# Patient Record
Sex: Male | Born: 1975
Health system: Southern US, Community
[De-identification: ages and names within clinical notes are randomized; demographics above are authoritative.]

## PROBLEM LIST (undated history)

## (undated) DIAGNOSIS — Z9889 Other specified postprocedural states: Secondary | ICD-10-CM

## (undated) DIAGNOSIS — M1990 Unspecified osteoarthritis, unspecified site: Secondary | ICD-10-CM

## (undated) DIAGNOSIS — G629 Polyneuropathy, unspecified: Secondary | ICD-10-CM

## (undated) DIAGNOSIS — F419 Anxiety disorder, unspecified: Secondary | ICD-10-CM

## (undated) DIAGNOSIS — R112 Nausea with vomiting, unspecified: Secondary | ICD-10-CM

## (undated) DIAGNOSIS — I10 Essential (primary) hypertension: Secondary | ICD-10-CM

## (undated) DIAGNOSIS — F329 Major depressive disorder, single episode, unspecified: Secondary | ICD-10-CM

## (undated) DIAGNOSIS — K219 Gastro-esophageal reflux disease without esophagitis: Secondary | ICD-10-CM

## (undated) DIAGNOSIS — G8929 Other chronic pain: Secondary | ICD-10-CM

## (undated) DIAGNOSIS — F909 Attention-deficit hyperactivity disorder, unspecified type: Secondary | ICD-10-CM

## (undated) DIAGNOSIS — M549 Dorsalgia, unspecified: Secondary | ICD-10-CM

## (undated) DIAGNOSIS — E669 Obesity, unspecified: Secondary | ICD-10-CM

## (undated) DIAGNOSIS — F32A Depression, unspecified: Secondary | ICD-10-CM

## (undated) HISTORY — PX: BACK SURGERY: SHX140

## (undated) HISTORY — PX: CARPAL TUNNEL RELEASE: SHX101

## (undated) HISTORY — PX: TONSILLECTOMY: SUR1361

## (undated) HISTORY — PX: KNEE SURGERY: SHX244

## (undated) HISTORY — PX: HAND SURGERY: SHX662

---

## 2005-01-26 ENCOUNTER — Emergency Department (HOSPITAL_COMMUNITY): Admission: EM | Admit: 2005-01-26 | Discharge: 2005-01-27 | Payer: Self-pay | Admitting: *Deleted

## 2005-01-31 ENCOUNTER — Emergency Department (HOSPITAL_COMMUNITY): Admission: EM | Admit: 2005-01-31 | Discharge: 2005-01-31 | Payer: Self-pay | Admitting: Emergency Medicine

## 2005-11-07 ENCOUNTER — Emergency Department (HOSPITAL_COMMUNITY): Admission: EM | Admit: 2005-11-07 | Discharge: 2005-11-07 | Payer: Self-pay | Admitting: Emergency Medicine

## 2005-11-12 ENCOUNTER — Ambulatory Visit (HOSPITAL_COMMUNITY): Admission: RE | Admit: 2005-11-12 | Discharge: 2005-11-12 | Payer: Self-pay | Admitting: Orthopaedic Surgery

## 2006-02-21 ENCOUNTER — Emergency Department (HOSPITAL_COMMUNITY): Admission: EM | Admit: 2006-02-21 | Discharge: 2006-02-21 | Payer: Self-pay | Admitting: Emergency Medicine

## 2006-03-08 ENCOUNTER — Emergency Department (HOSPITAL_COMMUNITY): Admission: EM | Admit: 2006-03-08 | Discharge: 2006-03-08 | Payer: Self-pay | Admitting: Emergency Medicine

## 2006-03-17 ENCOUNTER — Ambulatory Visit: Payer: Self-pay | Admitting: Orthopedic Surgery

## 2006-03-21 ENCOUNTER — Ambulatory Visit (HOSPITAL_COMMUNITY): Admission: RE | Admit: 2006-03-21 | Discharge: 2006-03-21 | Payer: Self-pay | Admitting: Internal Medicine

## 2006-04-20 ENCOUNTER — Emergency Department (HOSPITAL_COMMUNITY): Admission: EM | Admit: 2006-04-20 | Discharge: 2006-04-20 | Payer: Self-pay | Admitting: Emergency Medicine

## 2006-07-02 ENCOUNTER — Emergency Department (HOSPITAL_COMMUNITY): Admission: EM | Admit: 2006-07-02 | Discharge: 2006-07-02 | Payer: Self-pay | Admitting: Emergency Medicine

## 2006-07-14 ENCOUNTER — Encounter: Admission: RE | Admit: 2006-07-14 | Discharge: 2006-07-14 | Payer: Self-pay | Admitting: Orthopedic Surgery

## 2006-07-24 ENCOUNTER — Encounter (HOSPITAL_COMMUNITY): Admission: RE | Admit: 2006-07-24 | Discharge: 2006-08-22 | Payer: Self-pay | Admitting: Family Medicine

## 2006-08-21 ENCOUNTER — Inpatient Hospital Stay (HOSPITAL_COMMUNITY): Admission: RE | Admit: 2006-08-21 | Discharge: 2006-08-23 | Payer: Self-pay | Admitting: Specialist

## 2006-10-06 ENCOUNTER — Emergency Department (HOSPITAL_COMMUNITY): Admission: EM | Admit: 2006-10-06 | Discharge: 2006-10-07 | Payer: Self-pay | Admitting: Emergency Medicine

## 2006-12-11 ENCOUNTER — Emergency Department (HOSPITAL_COMMUNITY): Admission: EM | Admit: 2006-12-11 | Discharge: 2006-12-11 | Payer: Self-pay | Admitting: Emergency Medicine

## 2007-06-03 ENCOUNTER — Emergency Department (HOSPITAL_COMMUNITY): Admission: EM | Admit: 2007-06-03 | Discharge: 2007-06-03 | Payer: Self-pay | Admitting: Emergency Medicine

## 2007-06-07 ENCOUNTER — Emergency Department (HOSPITAL_COMMUNITY): Admission: EM | Admit: 2007-06-07 | Discharge: 2007-06-07 | Payer: Self-pay | Admitting: Emergency Medicine

## 2008-02-01 ENCOUNTER — Emergency Department (HOSPITAL_COMMUNITY): Admission: EM | Admit: 2008-02-01 | Discharge: 2008-02-01 | Payer: Self-pay | Admitting: Emergency Medicine

## 2008-07-07 ENCOUNTER — Encounter (HOSPITAL_COMMUNITY)
Admission: RE | Admit: 2008-07-07 | Discharge: 2008-08-06 | Payer: Self-pay | Admitting: Adult Reconstructive Orthopaedic Surgery

## 2008-10-08 ENCOUNTER — Emergency Department (HOSPITAL_COMMUNITY): Admission: EM | Admit: 2008-10-08 | Discharge: 2008-10-08 | Payer: Self-pay | Admitting: Emergency Medicine

## 2009-06-09 ENCOUNTER — Emergency Department (HOSPITAL_COMMUNITY): Admission: EM | Admit: 2009-06-09 | Discharge: 2009-06-09 | Payer: Self-pay | Admitting: Emergency Medicine

## 2009-06-15 ENCOUNTER — Emergency Department (HOSPITAL_COMMUNITY): Admission: EM | Admit: 2009-06-15 | Discharge: 2009-06-15 | Payer: Self-pay | Admitting: Emergency Medicine

## 2009-10-26 ENCOUNTER — Emergency Department (HOSPITAL_COMMUNITY): Admission: EM | Admit: 2009-10-26 | Discharge: 2009-10-26 | Payer: Self-pay | Admitting: Emergency Medicine

## 2009-10-31 ENCOUNTER — Emergency Department (HOSPITAL_COMMUNITY): Admission: EM | Admit: 2009-10-31 | Discharge: 2009-10-31 | Payer: Self-pay | Admitting: Emergency Medicine

## 2010-01-02 ENCOUNTER — Emergency Department (HOSPITAL_COMMUNITY): Admission: EM | Admit: 2010-01-02 | Discharge: 2010-01-02 | Payer: Self-pay | Admitting: Emergency Medicine

## 2010-01-11 ENCOUNTER — Emergency Department (HOSPITAL_COMMUNITY): Admission: EM | Admit: 2010-01-11 | Discharge: 2010-01-11 | Payer: Self-pay | Admitting: Emergency Medicine

## 2010-01-26 ENCOUNTER — Emergency Department (HOSPITAL_COMMUNITY): Admission: EM | Admit: 2010-01-26 | Discharge: 2010-01-26 | Payer: Self-pay | Admitting: Emergency Medicine

## 2010-02-03 ENCOUNTER — Emergency Department (HOSPITAL_COMMUNITY): Admission: EM | Admit: 2010-02-03 | Discharge: 2010-02-03 | Payer: Self-pay | Admitting: Emergency Medicine

## 2010-02-12 ENCOUNTER — Emergency Department (HOSPITAL_COMMUNITY): Admission: EM | Admit: 2010-02-12 | Discharge: 2010-02-12 | Payer: Self-pay | Admitting: Emergency Medicine

## 2010-02-16 ENCOUNTER — Emergency Department (HOSPITAL_COMMUNITY): Admission: EM | Admit: 2010-02-16 | Discharge: 2010-02-16 | Payer: Self-pay | Admitting: Emergency Medicine

## 2010-03-01 ENCOUNTER — Emergency Department (HOSPITAL_COMMUNITY): Admission: EM | Admit: 2010-03-01 | Discharge: 2010-03-01 | Payer: Self-pay | Admitting: Emergency Medicine

## 2010-03-12 ENCOUNTER — Emergency Department (HOSPITAL_COMMUNITY): Admission: EM | Admit: 2010-03-12 | Discharge: 2010-03-12 | Payer: Self-pay | Admitting: Family Medicine

## 2010-03-20 ENCOUNTER — Ambulatory Visit (HOSPITAL_COMMUNITY): Payer: Self-pay | Admitting: Psychiatry

## 2010-03-28 ENCOUNTER — Emergency Department (HOSPITAL_COMMUNITY): Admission: EM | Admit: 2010-03-28 | Discharge: 2010-03-28 | Payer: Self-pay | Admitting: Family Medicine

## 2010-03-31 ENCOUNTER — Emergency Department (HOSPITAL_COMMUNITY): Admission: EM | Admit: 2010-03-31 | Discharge: 2010-03-31 | Payer: Self-pay | Admitting: Emergency Medicine

## 2010-04-02 ENCOUNTER — Emergency Department (HOSPITAL_COMMUNITY): Admission: EM | Admit: 2010-04-02 | Discharge: 2010-04-02 | Payer: Self-pay | Admitting: Emergency Medicine

## 2010-05-23 ENCOUNTER — Emergency Department (HOSPITAL_COMMUNITY): Admission: EM | Admit: 2010-05-23 | Discharge: 2010-05-23 | Payer: Self-pay | Admitting: Emergency Medicine

## 2010-06-12 ENCOUNTER — Emergency Department (HOSPITAL_COMMUNITY): Admission: EM | Admit: 2010-06-12 | Discharge: 2010-06-12 | Payer: Self-pay | Admitting: Emergency Medicine

## 2010-08-14 ENCOUNTER — Ambulatory Visit (HOSPITAL_COMMUNITY): Payer: Self-pay | Admitting: Psychiatry

## 2010-09-12 ENCOUNTER — Emergency Department (HOSPITAL_COMMUNITY)
Admission: EM | Admit: 2010-09-12 | Discharge: 2010-09-12 | Payer: Self-pay | Source: Home / Self Care | Admitting: Emergency Medicine

## 2010-10-03 ENCOUNTER — Ambulatory Visit (HOSPITAL_COMMUNITY): Payer: Self-pay | Admitting: Psychiatry

## 2010-11-01 ENCOUNTER — Emergency Department (HOSPITAL_COMMUNITY): Admission: EM | Admit: 2010-11-01 | Discharge: 2010-09-27 | Payer: Self-pay | Admitting: Emergency Medicine

## 2010-11-23 ENCOUNTER — Ambulatory Visit (HOSPITAL_COMMUNITY): Payer: Self-pay | Admitting: Psychiatry

## 2010-12-14 ENCOUNTER — Ambulatory Visit (HOSPITAL_COMMUNITY): Admit: 2010-12-14 | Payer: Self-pay | Admitting: Psychiatry

## 2010-12-16 ENCOUNTER — Encounter: Payer: Self-pay | Admitting: Orthopedic Surgery

## 2010-12-17 ENCOUNTER — Emergency Department (HOSPITAL_COMMUNITY)
Admission: EM | Admit: 2010-12-17 | Discharge: 2010-12-17 | Payer: Self-pay | Source: Home / Self Care | Admitting: Emergency Medicine

## 2011-01-20 ENCOUNTER — Emergency Department (HOSPITAL_COMMUNITY)
Admission: EM | Admit: 2011-01-20 | Discharge: 2011-01-21 | Disposition: A | Discharge status: Home / Self Care | Payer: Self-pay | Attending: Emergency Medicine | Admitting: Emergency Medicine

## 2011-01-20 DIAGNOSIS — M545 Low back pain, unspecified: Secondary | ICD-10-CM | POA: Insufficient documentation

## 2011-01-20 DIAGNOSIS — E669 Obesity, unspecified: Secondary | ICD-10-CM | POA: Insufficient documentation

## 2011-01-20 DIAGNOSIS — M546 Pain in thoracic spine: Secondary | ICD-10-CM | POA: Insufficient documentation

## 2011-01-20 DIAGNOSIS — F319 Bipolar disorder, unspecified: Secondary | ICD-10-CM | POA: Insufficient documentation

## 2011-01-20 DIAGNOSIS — M542 Cervicalgia: Secondary | ICD-10-CM | POA: Insufficient documentation

## 2011-01-20 DIAGNOSIS — K219 Gastro-esophageal reflux disease without esophagitis: Secondary | ICD-10-CM | POA: Insufficient documentation

## 2011-01-20 DIAGNOSIS — F17200 Nicotine dependence, unspecified, uncomplicated: Secondary | ICD-10-CM | POA: Insufficient documentation

## 2011-01-20 DIAGNOSIS — G8929 Other chronic pain: Secondary | ICD-10-CM | POA: Insufficient documentation

## 2011-01-20 DIAGNOSIS — M79609 Pain in unspecified limb: Secondary | ICD-10-CM | POA: Insufficient documentation

## 2011-01-26 ENCOUNTER — Emergency Department (HOSPITAL_COMMUNITY): Payer: Self-pay

## 2011-01-26 ENCOUNTER — Emergency Department (HOSPITAL_COMMUNITY)
Admission: EM | Admit: 2011-01-26 | Discharge: 2011-01-26 | Disposition: A | Discharge status: Home / Self Care | Payer: Self-pay | Attending: Emergency Medicine | Admitting: Emergency Medicine

## 2011-01-26 DIAGNOSIS — R109 Unspecified abdominal pain: Secondary | ICD-10-CM | POA: Insufficient documentation

## 2011-01-26 DIAGNOSIS — M542 Cervicalgia: Secondary | ICD-10-CM | POA: Insufficient documentation

## 2011-01-26 DIAGNOSIS — R1012 Left upper quadrant pain: Secondary | ICD-10-CM | POA: Insufficient documentation

## 2011-01-26 DIAGNOSIS — K219 Gastro-esophageal reflux disease without esophagitis: Secondary | ICD-10-CM | POA: Insufficient documentation

## 2011-01-26 DIAGNOSIS — M549 Dorsalgia, unspecified: Secondary | ICD-10-CM | POA: Insufficient documentation

## 2011-01-26 DIAGNOSIS — R635 Abnormal weight gain: Secondary | ICD-10-CM | POA: Insufficient documentation

## 2011-01-26 DIAGNOSIS — F319 Bipolar disorder, unspecified: Secondary | ICD-10-CM | POA: Insufficient documentation

## 2011-01-26 DIAGNOSIS — F909 Attention-deficit hyperactivity disorder, unspecified type: Secondary | ICD-10-CM | POA: Insufficient documentation

## 2011-01-26 DIAGNOSIS — G8929 Other chronic pain: Secondary | ICD-10-CM | POA: Insufficient documentation

## 2011-01-26 DIAGNOSIS — E669 Obesity, unspecified: Secondary | ICD-10-CM | POA: Insufficient documentation

## 2011-01-26 LAB — COMPREHENSIVE METABOLIC PANEL
ALT: 40 U/L (ref 0–53)
AST: 27 U/L (ref 0–37)
Alkaline Phosphatase: 76 U/L (ref 39–117)
Calcium: 8.6 mg/dL (ref 8.4–10.5)
GFR calc Af Amer: >60 mL/min (ref >60)
Potassium: 4.6 mEq/L (ref 3.5–5.1)
Sodium: 139 mEq/L (ref 135–145)
Total Protein: 6.6 g/dL (ref 6.0–8.3)

## 2011-01-26 LAB — DIFFERENTIAL
Eosinophils Absolute: 0.2 10*3/uL (ref 0.0–0.7)
Eosinophils Relative: 2 % (ref 0–5)
Lymphocytes Relative: 37 % (ref 12–46)
Lymphs Abs: 3.4 10*3/uL (ref 0.7–4.0)
Monocytes Absolute: 0.6 10*3/uL (ref 0.1–1.0)
Monocytes Relative: 6 % (ref 3–12)

## 2011-01-26 LAB — URINALYSIS, ROUTINE W REFLEX MICROSCOPIC
Bilirubin Urine: NEGATIVE
Hgb urine dipstick: NEGATIVE
Nitrite: NEGATIVE
Specific Gravity, Urine: 1.031 — ABNORMAL HIGH (ref 1.005–1.030)
Urobilinogen, UA: 1 mg/dL (ref 0.0–1.0)
pH: 6 (ref 5.0–8.0)

## 2011-01-26 LAB — CBC
HCT: 40.3 % (ref 39.0–52.0)
MCH: 30.5 pg (ref 26.0–34.0)
MCHC: 34.5 g/dL (ref 30.0–36.0)
MCV: 88.4 fL (ref 78.0–100.0)
RDW: 12.1 % (ref 11.5–15.5)

## 2011-02-06 LAB — GLUCOSE, CAPILLARY

## 2011-02-09 LAB — COMPREHENSIVE METABOLIC PANEL
Alkaline Phosphatase: 69 U/L (ref 39–117)
BUN: 11 mg/dL (ref 6–23)
Chloride: 104 mEq/L (ref 96–112)
GFR calc non Af Amer: >60 mL/min (ref >60)
Glucose, Bld: 119 mg/dL — ABNORMAL HIGH (ref 70–99)
Potassium: 4 mEq/L (ref 3.5–5.1)
Total Bilirubin: 0.6 mg/dL (ref 0.3–1.2)

## 2011-02-09 LAB — CBC
HCT: 43.3 % (ref 39.0–52.0)
Hemoglobin: 15.4 g/dL (ref 13.0–17.0)
MCH: 32.6 pg (ref 26.0–34.0)
RBC: 4.71 MIL/uL (ref 4.22–5.81)

## 2011-02-09 LAB — URINALYSIS, ROUTINE W REFLEX MICROSCOPIC
Bilirubin Urine: NEGATIVE
Glucose, UA: NEGATIVE mg/dL
Hgb urine dipstick: NEGATIVE
Nitrite: NEGATIVE
Specific Gravity, Urine: 1.018 (ref 1.005–1.030)
pH: 6 (ref 5.0–8.0)

## 2011-02-09 LAB — DIFFERENTIAL
Basophils Absolute: 0 10*3/uL (ref 0.0–0.1)
Basophils Relative: 0 % (ref 0–1)
Neutro Abs: 6.8 10*3/uL (ref 1.7–7.7)
Neutrophils Relative %: 67 % (ref 43–77)

## 2011-02-09 LAB — POCT CARDIAC MARKERS: Myoglobin, poc: 34.8 ng/mL (ref 12–200)

## 2011-02-10 LAB — GLUCOSE, CAPILLARY: Glucose-Capillary: 126 mg/dL — ABNORMAL HIGH (ref 70–99)

## 2011-02-17 LAB — CBC
HCT: 46.3 % (ref 39.0–52.0)
MCV: 93.1 fL (ref 78.0–100.0)
RBC: 4.97 MIL/uL (ref 4.22–5.81)
WBC: 14 10*3/uL — ABNORMAL HIGH (ref 4.0–10.5)

## 2011-02-17 LAB — POCT I-STAT, CHEM 8
BUN: 23 mg/dL (ref 6–23)
Creatinine, Ser: 1 mg/dL (ref 0.4–1.5)
Glucose, Bld: 82 mg/dL (ref 70–99)
Hemoglobin: 16 g/dL (ref 13.0–17.0)
Potassium: 4.2 mEq/L (ref 3.5–5.1)

## 2011-02-17 LAB — DIFFERENTIAL
Eosinophils Absolute: 0.2 10*3/uL (ref 0.0–0.7)
Lymphs Abs: 4.6 10*3/uL — ABNORMAL HIGH (ref 0.7–4.0)
Monocytes Relative: 7 % (ref 3–12)
Neutrophils Relative %: 59 % (ref 43–77)

## 2011-02-20 ENCOUNTER — Emergency Department (HOSPITAL_COMMUNITY): Payer: Self-pay

## 2011-02-20 ENCOUNTER — Emergency Department (HOSPITAL_COMMUNITY)
Admission: EM | Admit: 2011-02-20 | Discharge: 2011-02-20 | Disposition: A | Discharge status: Home / Self Care | Payer: Self-pay | Attending: Emergency Medicine | Admitting: Emergency Medicine

## 2011-02-20 DIAGNOSIS — M25569 Pain in unspecified knee: Secondary | ICD-10-CM | POA: Insufficient documentation

## 2011-02-20 DIAGNOSIS — F17200 Nicotine dependence, unspecified, uncomplicated: Secondary | ICD-10-CM | POA: Insufficient documentation

## 2011-03-12 ENCOUNTER — Emergency Department (HOSPITAL_COMMUNITY)
Admission: EM | Admit: 2011-03-12 | Discharge: 2011-03-12 | Disposition: A | Discharge status: Home / Self Care | Payer: Self-pay | Attending: Emergency Medicine | Admitting: Emergency Medicine

## 2011-03-12 DIAGNOSIS — F909 Attention-deficit hyperactivity disorder, unspecified type: Secondary | ICD-10-CM | POA: Insufficient documentation

## 2011-03-12 DIAGNOSIS — K219 Gastro-esophageal reflux disease without esophagitis: Secondary | ICD-10-CM | POA: Insufficient documentation

## 2011-03-12 DIAGNOSIS — G8929 Other chronic pain: Secondary | ICD-10-CM | POA: Insufficient documentation

## 2011-03-12 DIAGNOSIS — F17200 Nicotine dependence, unspecified, uncomplicated: Secondary | ICD-10-CM | POA: Insufficient documentation

## 2011-03-12 DIAGNOSIS — M545 Low back pain, unspecified: Secondary | ICD-10-CM | POA: Insufficient documentation

## 2011-03-12 DIAGNOSIS — F319 Bipolar disorder, unspecified: Secondary | ICD-10-CM | POA: Insufficient documentation

## 2011-04-12 NOTE — Op Note (Signed)
NAME:  [name], [name] NO.:  [id number]   MEDICAL RECORD NO.:  [id number]          PATIENT TYPE:   LOCATION:                                 FACILITY:   PHYSICIAN:  Kerrin Champagne, M.D.        DATE OF BIRTH:   DATE OF PROCEDURE:  08/21/2006  DATE OF DISCHARGE:                                 OPERATIVE REPORT   PREOPERATIVE DIAGNOSES:  Herniated nucleus pulposus, central and right-sided  L4-5; paronychial infection, left ulnar, long finger.   POSTOPERATIVE DIAGNOSES:  Herniated nucleus pulposus, central and right-  sided L4-5; paronychial infection, left ulnar, long finger.   PROCEDURES:  1. Right L4-5 microdiskectomy, left L4-5 semi-hemilaminectomy for      exploration of central disk herniation at L4-5 from the left side.  2. Incision, drainage and debridement of left long finger paronychia.   SURGEON:  Kerrin Champagne, M.D.   ASSISTANT:  Wende Neighbors, P.A.-C.   ANESTHESIA:  General via orotracheal intubation, Dr. Judie Petit.   SPECIMENS:  None.   CULTURES:  Left long finger paronychia, sent for culture and sensitivity.   ESTIMATED BLOOD LOSS:  40 cc.   COMPLICATIONS:  None.   DISPOSITION:  The patient returned to the PACU in good condition.   HISTORY OF PRESENT ILLNESS:  The patient is a 35 year old male, who has had  a long history of low back pain.  Over the last 6 months, this pain has  worsened to radiation in his right lower extremity, which is severe and  persistent.  His clinical exam shows weakness in right foot dorsiflexion and  EHL strength.  He has pain with straight leg raise on the right and dangling  position.  He is morbidly obese at 390 pounds.  He underwent preoperative  evaluation, which shows central disk herniation at L4-5 with extension to  the right side.  He has been treated conservatively by Dr. Lavada Mesi,  and is referred for evaluation and treatment.  They had prolonged discussion  regarding the size of the  individual and concerns regarding the possibility  of degenerative disk disease that was present prior to disk herniation and  is likely to be present following the disk excision.  He wishes to go ahead  with disk excision to relieve leg pain, knowing that postoperatively, he may  continue to have some degree of lower back discomfort.  The risks of  surgery, including infection, bleeding and neurologic compromise were  discussed with the patient preoperatively.  Note that the patient is on  Medicaid, and that this is apparently scheduled to stop over the next week  or so.  He does have a paronychial infection over the left long finger.  This will be left intact.  At the end of the case, he will undergo in I&D  after he has had adequate IV antibiotics of vancomycin.   DESCRIPTION OF PROCEDURES:  After adequate general anesthesia, the patient  was transferred to the operating room table.  Chest rolls were used and  pillows beneath the legs.  No Foley catheter.  The  patient is quite large  and care was taken in positioning the patient to insure that his chest rolls  were in appropriate position and alignment and the genitalia were without  compression.  The upper extremities were in position to prevent nerve  compression.  All pressure points were well padded.  The patient had  standard TED stockings and PAS hose.  Standard prep sterilely with DuraPrep  solution from the lower lumbar spine to the midsacral segment.  Draped in  the usual manner.  Iodine Vi-Drape was used.  Preoperative antibiotics with  vancomycin. The patient had 2 spinal needles placed, and an intraoperative  lateral radiograph obtained, demonstrating the needles at the L4 and L5  segments.  Incision was then made approximately 3 1/2 inches in length  through the skin and subcutaneous layers, carried down to the lumbodorsal  fascia in the midline.  Here, the lumbodorsal fascia was incised on both  sides of the spinous  processes in the interspinous ligament at the expected  L4, L5 and part of the inferior aspect of the L3 level.  The McCullough  retractors were used with the longest blades possible.  These needed to be  subset beneath the skin surface in order to allow for exposure of the deeper  structures.  A clamp was placed on the spinous process of L4.  Intraoperative lateral radiograph demonstrated a clamp at the superior  aspect of L4.  Cobb elevators were then used to elevate the muscles on both  sides off the lamina of L4 and the posterior aspect of the interlaminar  region at L4-5, as well as of the L5 lamina superiorly.  Again, the  McCullough retractor and the Boss retractors were inset even further into  the incision in order to allow for further exposure, as there no long  retractor foot blades available.  Headlight and loupe magnification were  used during this portion of the case.  A small amount of paralumbar muscles  were resected on both sides to allow for further exposure of the central  areas of the interlaminar region posteriorly at L4-5 bilaterally.  Leksell  rongeurs were then used to remove a small portion of the inferior aspect of  the lamina of L4 on both sides.  A high-speed bur was then used to further  thin the posterior elements down to the ligamentum flavum, again, over the  inferior aspect of the lamina bilaterally, over the medial facet on the  right side greater than the left, resecting about 15% of the medial portion  of the inferior articular process of L4.  On the left side,  similarly, this  was carried out.  Next, the 3-mm Kerrison was introduced and long Kerrisons  were used and long instruments were used at this point, removing a small  portion of the inferior aspect of the lamina on the right side and left side  up to the insertion of the ligamentum flavum, then elevating the ligamentum flavum with a nerve hook and resecting then from superior to inferior,   resecting the ligamentum flavum off the superior aspect of the lamina of L5  bilaterally.  The medial portion of the facet, capsular attachment and  ligamentum flavum attachment were then resected using 3-mm Kerrisons  provided on both sides.  Bleeders were controlled using bipolar  electrocautery.  Gelfoam, thrombin soaked, was then placed in the lateral  gutters.  The operating room microscope was carefully draped sterilely and  brought into the field.  First,  the right side was evaluated and a Penfield  #4 used to carefully free up the right L5 nerve root and retract it  medially, along with the lateral aspect of the thecal sac at the L4-5 level.  Epidural veins were noted to be distended and these were cauterized using  bipolar electrocautery.  Thrombin-soaked Gelfoam placed to maintain  hemostasis.  Disk was noted to be protruded, central and towards the right  side.  With the thecal sac retracted, then a 15-blade scalpel was used to  incise the disk on the right side over its inner 1/3 longitudinally.  Pituitary rongeurs were then introduced into the disk space and then used to  debride the disk space of degenerative disk material.  A very large  subligamentous fragment was then removed, which represented a large portion  of the central and right-sided disk herniation.  Following this, then,  further inspection of the disk demonstrated no further remnants of disk  material remaining free that would represent fragments that may herniate  acutely.  Irrigation was performed.  Gelfilm was placed into the lateral  recess, and then evaluation was carried out on the left side.  On the left  side, the thecal sac was carefully freed up with a Penfield #4 and a long  D'Errico retractor used to retract the lateral aspect of the thecal sac and  the L5 nerve root medially.  The disk itself was covered with very large  veins.  No attempt was made to cauterize these or disturb them, and a  standard  Penfield #4 was used to carefully probe the disk, and this  demonstrated no further herniated disk material present on the left side.  The recess had been well decompressed, and a hockey stick neural probe could  be passed out the L5 and L4 neural foramina without signs of any nerve  compression here.  Similarly, a hockey stick neural probe could be passed  out the right-sided L4 and L5 neural foramina, demonstrating patency of the  foramina and no signs of neural foraminal narrowing or lateral recess  stenosis.  Gelfilm, thrombin soaked, was placed in the lateral gutters.  Irrigation was performed.  After a few moments, the Gelfilm was removed and  there was no active bleeding present.  Soft tissues were allowed to fall  back into place.  The lumbodorsal fascia was then reapproximated in the  midline with interrupted 0 Vicryl sutures, using a UR6 needle depth.  The operating room microscope was used to perform this.  The subcu layer was  approximated with interrupted 0 Vicryl sutures, the more superficial layers  with interrupted 0 Vicryl sutures, and the even more superficial layers with  interrupted 2-0 Vicryl sutures.  Skin closed with a running subcu stitch of  4-0 Vicryl.  Dermabond was then applied and the skin edges well  approximated, demonstrating good alignment with this.  And 4 x 4's were  fixed to the skin with Hyperfix tape.  After removing drapes, the left hand  was evaluated.  The finger was felt to show an infected paronychia  laterally.  This was prepped with Betadine Prep and then lanced using a  small Metzenbaum scissors to resect the lateral aspect of the long  fingernail down to the nail matrix.  This relieved any pressure on the nail  fold laterally as a result of the patient's nail.  The are of paronychial  swelling and infection spontaneously decompressed with this maneuver.  It  represented a bullous-like  lesion with very thin skin, and this thin skin  was  resected, as it was nonviable.  The raw area over the lateral aspect of  the fingernail was then carefully cleansed.  Xeroform gauze was then placed  and 2 x 2's fixed to the finger with a 1-inch Kling.  Coban was then  applied.  Note that cultures and sensitivities were obtained and the  paronychia drained in order to determine the bacterial type and  sensitivities.  The patient was then returned to a supine position,  reactivated, extubated and returned to the recovery room in satisfactory  condition.  All instrument and sponge counts were correct.      Kerrin Champagne, M.D.  Electronically Signed     JEN/MEDQ  D:  08/21/2006  T:  08/21/2006  Job:  [id number]

## 2011-04-12 NOTE — Op Note (Signed)
NAMELUNDY, COZART NO.:  [id number]   MEDICAL RECORD NO.:  [id number]          PATIENT TYPE:  AMB   LOCATION:  SDS                          FACILITY:  [facility]   PHYSICIAN:  Vanita Panda. Magnus Ivan, M.D.DATE OF BIRTH:  18-Jun-1976   DATE OF PROCEDURE:  11/12/2005  DATE OF DISCHARGE:  11/12/2005                                 OPERATIVE REPORT   PREOPERATIVE DIAGNOSIS:  Right dorsal hand wound with extensor tendon  lacerations.   POSTOPERATIVE DIAGNOSIS:  Right dorsal hand laceration complete injuries to  extensor digitorum indices and extensor digitorum communis to the index  finger.   PROCEDURE:  1.  Right hand wound exploration.  2.  Right hand wound irrigation and debridement.  3.  Direct primary repair of right hand extensor digitorum indices.   Please delete and disregard this dictation altogether.           ______________________________  Vanita Panda. Magnus Ivan, M.D.     CYB/MEDQ  D:  11/12/2005  T:  11/14/2005  Job:  [id number]

## 2011-04-12 NOTE — Discharge Summary (Signed)
[name], [name] NO.:  [id number]   MEDICAL RECORD NO.:  [id number]          PATIENT TYPE:  INP   LOCATION:  5020                         FACILITY:  [facility]   PHYSICIAN:  Kerrin Champagne, M.D.   DATE OF BIRTH:  [birth year]   DATE OF ADMISSION:  08/21/2006  DATE OF DISCHARGE:  08/23/2006                                 DISCHARGE SUMMARY   ADMISSION DIAGNOSIS:  1. Herniated nucleus pulposus, central right side at L4-5.  2. Paronychium infection, left ulnar long finger.   DISCHARGE DIAGNOSES:  1. Herniated nucleus pulposus, central right side at L4-5.  2. Paronychium infection, left ulnar long finger.   PROCEDURES:  On August 21, 2006, the patient underwent right L4-5  microdiskectomy, left L4-5 semi hemilaminectomy for exploration of central  disk herniation at the L4-5 level from the left side.  1. Incision, drainage and debridement of left long finger paronychia.      This was performed by Dr. Otelia Ramirez, assisted by Maud Deed, [facility], under      general anesthesia.   CONSULTATIONS:  None.   BRIEF HISTORY:  The patient is a 35 year old male with a 35-month history of  increasing pain in the low back with radiation to his right lower extremity  which is severe and persistent.  Clinical exam showed weakness in the right  foot dorsiflexion and EHL strength.  He had pain with straight leg raise as  well as in the dangling position on the right.  The patient was noted to be  morbidly obese at 390 pounds.  MRI scan did show central disk herniation at  L4-5 with extension to the right side.  Conservative treatment was no longer  giving him relief of his symptoms, and it was felt he would require surgical  intervention and was admitted for the procedure as stated above.   BRIEF HOSPITAL COURSE:  Upon in the admission to the hospital for his back  surgery, he was noted to have parenchymal infection of the left long finger.  The surgical case was not cancelled;  however, the incision,  drainage and  debridement was added to the left long finger.  Culture and sensitivity were  sent from the purulent matter taken from the left long finger paronychia.  He was given IV vancomycin intraoperatively and postoperatively.  The  patient tolerated the procedure under general anesthesia without  complications.   Postoperatively, the patient was very slow with activity.  Physical therapy  and occupational therapy were obtained.  The patient was assisted with out  of bed and ambulation in the hallway.  A walker was given to him for  ambulation.  He had postoperative nausea which was limiting him on the first  postoperative day with his activity, and, therefore, he remained in the  hospital for an additional day.  On the second postoperative day, he did  meet all goals and demonstrated good safety and judgment and was able to be  discharged to his home.   Recommendations for home health physical therapy were made and arranged  prior to discharge.  His incision was healing without  signs of infection.  He was able to take oral analgesics without difficulty.  The dressing of his  paronychia incision and drainage was kept intact.  He was started on  doxycycline 100 mg one p.o. q.12 h.   Culture and sensitivity of the paronychia on the left long finger showed  multiple organisms. No Staphylococcus aureus isolated.  No group A strep  isolated.  Other pertinent labs include CBC on admission which was within  normal limits.   PLAN:  The patient was discharged to his home.  He was instructed to  ambulate as tolerated.  He will keep his incision dry and clean for 4 days  and then will be allowed to shower if there is no wound drainage.  The left  long finger wound will be soaked in soapy water two times a day with  antibiotic ointment applied and dressing applied to the finger.  He will  have no driving for 2 weeks.  No lifting for 4-6 weeks greater than 10  pounds.   He is encouraged to increase his activity slowly.  He will have a  regular diet at home.   Prescriptions given include:  1. Percocet 5/325 one to two every 4-6 hours as needed for pain.  2. Doxycycline 100 mg one p.o. q.12 h.  3. Robaxin 1 every 8 hours as needed for spasm.  4. Restoril 15 mg 1 at bedtime as needed for sleep.   He will follow up with Dr. Otelia Ramirez in 2 weeks.   CONDITION ON DISCHARGE:  Stable.      Wende Neighbors, P.A.      Kerrin Champagne, M.D.  Electronically Signed    SMV/MEDQ  D:  09/22/2006  T:  09/22/2006  Job:  [id number]

## 2011-04-12 NOTE — Op Note (Signed)
[name], [name] NO.:  [id number]   MEDICAL RECORD NO.:  [id number]          PATIENT TYPE:  AMB   LOCATION:  SDS                          FACILITY:  [facility]   PHYSICIAN:  Vanita Panda. Magnus Ivan, M.D.DATE OF BIRTH:  October 29, 1976   DATE OF PROCEDURE:  11/12/2005  DATE OF DISCHARGE:  11/12/2005                                 OPERATIVE REPORT   PREOPERATIVE DIAGNOSIS:  Right dorsal hand laceration with extensor tendon  injuries.   POSTOPERATIVE DIAGNOSIS:  Right hand laceration with complete injury to  extensor indicis proprius (EIP)  tendon and extensor digitorum communis  (EDC) to the index finger.   PROCEDURES:  1.  Right hand wound exploration.  2.  Irrigation and debridement of right dorsal hand wound.  3.  Direct primary repair of right extensor indicis proprius tendon and      extensor digitorum communis tendon to the index finger.   SURGEON:  Vanita Panda. Magnus Ivan, M.D.   ANESTHESIA:  General.   BLOOD LOSS:  Minimal.   COMPLICATIONS:  None.   ANTIBIOTICS:  1 g IV Ancef.   INDICATIONS:  Briefly, [name] is a 29-year right-hand-dominant male  who four days ago sustained injury to his dominant right hand. He  accidentally punched a table that had glass on it and sustained a large  laceration to the dorsum of his hand.  This was cleaned by the ER staff and  closed, it was recognized that he had an inability to extend his index  finger.  He was given follow-up in my clinic and on examination I did find  this injury, so I set him up for exploration of the hand wound as well  hopefully a primary repair of this recognized tendon injury.  The risks and  benefits this were explained to him and are well-understood.  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   PROCEDURE DESCRIPTION:  After informed consent was obtained and the  appropriate right hand was marked, [name] was brought to the  operating  room and placed supine on the operating table.  General anesthesia was then  obtained and his right arm was placed on an arm table.  A nonsterile  tourniquet was placed around his upper arm but was never utilized during the  case.  His arm was then prepped and draped with Betadine scrub and paint.  Attention was turned to the dorsal hand wound.  The sutures were removed  from a large incision on the dorsum of his hand.  There was a large hematoma  that was noted.  A thorough irrigation and debridement was carried out and  hematoma was evacuated and cleaned thoroughly with 250 mL of bacitracin  solution.  This was followed by 1 L of normal saline solution.  It was  recognized that he had a complete rupture of his EIP tendon and his EDC  tendon to the index finger, but the proximal and distal stumps of the  laceration were easily visualized.  The extensor digitorum communis (EDC  tendon) to  the middle, ring and little fingers were all intact.  Next a  repair was carried out of the EIP and EDC tendons to the index finger.  This  was utilized using 3-0 Ethibond suture using a modified Kessler technique.  The tendon ends were brought together.  This was done first on the EIP  tendon and next on the [facility] to the index.  A 5-0 Ethibond suture was then  oversewn to the peritenon of each tendon to secure the repair.  The wound  was then irrigated again and was closed with the soft tissue being closed at  the level the skin with interrupted 3-0 Prolene suture.  The skin was  infiltrated with 0.25% plain Sensorcaine.  Again, the tourniquet was never  used during the case.  Xeroform was placed over the incision, followed by a  well-padded short-arm splint with the wrist extended and the fingers placed  in a position of function to allow for tension to be off of the tendon  repair.  Once the splint was hardened, the patient was awakened, extubated,  and taken to the recovery room in stable  condition.  Again, there were no  complications.           ______________________________  Vanita Panda. Magnus Ivan, M.D.     CYB/MEDQ  D:  11/12/2005  T:  11/14/2005  Job:  [id number]

## 2011-04-18 ENCOUNTER — Other Ambulatory Visit (HOSPITAL_COMMUNITY): Payer: Self-pay | Admitting: Internal Medicine

## 2011-04-18 DIAGNOSIS — IMO0002 Reserved for concepts with insufficient information to code with codable children: Secondary | ICD-10-CM

## 2011-04-19 ENCOUNTER — Other Ambulatory Visit: Payer: Self-pay | Admitting: Internal Medicine

## 2011-04-19 DIAGNOSIS — M545 Low back pain: Secondary | ICD-10-CM

## 2011-04-23 ENCOUNTER — Ambulatory Visit
Admission: RE | Admit: 2011-04-23 | Discharge: 2011-04-23 | Disposition: A | Discharge status: Home / Self Care | Payer: No Typology Code available for payment source | Source: Ambulatory Visit | Attending: Internal Medicine | Admitting: Internal Medicine

## 2011-04-23 DIAGNOSIS — M545 Low back pain: Secondary | ICD-10-CM

## 2011-04-26 ENCOUNTER — Emergency Department (HOSPITAL_COMMUNITY)
Admission: EM | Admit: 2011-04-26 | Discharge: 2011-04-26 | Disposition: A | Discharge status: Home / Self Care | Payer: Self-pay | Attending: Emergency Medicine | Admitting: Emergency Medicine

## 2011-04-26 DIAGNOSIS — M5380 Other specified dorsopathies, site unspecified: Secondary | ICD-10-CM | POA: Insufficient documentation

## 2011-04-26 DIAGNOSIS — M545 Low back pain, unspecified: Secondary | ICD-10-CM | POA: Insufficient documentation

## 2011-04-26 DIAGNOSIS — F319 Bipolar disorder, unspecified: Secondary | ICD-10-CM | POA: Insufficient documentation

## 2011-04-26 DIAGNOSIS — K219 Gastro-esophageal reflux disease without esophagitis: Secondary | ICD-10-CM | POA: Insufficient documentation

## 2011-04-26 DIAGNOSIS — F909 Attention-deficit hyperactivity disorder, unspecified type: Secondary | ICD-10-CM | POA: Insufficient documentation

## 2011-04-26 DIAGNOSIS — Z79899 Other long term (current) drug therapy: Secondary | ICD-10-CM | POA: Insufficient documentation

## 2011-04-26 DIAGNOSIS — F17200 Nicotine dependence, unspecified, uncomplicated: Secondary | ICD-10-CM | POA: Insufficient documentation

## 2011-05-07 ENCOUNTER — Ambulatory Visit: Payer: No Typology Code available for payment source

## 2011-05-17 ENCOUNTER — Ambulatory Visit: Payer: Self-pay | Attending: Internal Medicine | Admitting: Physical Therapy

## 2011-05-22 ENCOUNTER — Ambulatory Visit (HOSPITAL_BASED_OUTPATIENT_CLINIC_OR_DEPARTMENT_OTHER): Payer: No Typology Code available for payment source

## 2011-05-25 ENCOUNTER — Emergency Department (HOSPITAL_COMMUNITY)
Admission: EM | Admit: 2011-05-25 | Discharge: 2011-05-25 | Disposition: A | Discharge status: Home / Self Care | Payer: Self-pay | Attending: Emergency Medicine | Admitting: Emergency Medicine

## 2011-05-25 DIAGNOSIS — F17200 Nicotine dependence, unspecified, uncomplicated: Secondary | ICD-10-CM | POA: Insufficient documentation

## 2011-05-25 DIAGNOSIS — B029 Zoster without complications: Secondary | ICD-10-CM | POA: Insufficient documentation

## 2011-06-05 ENCOUNTER — Encounter: Payer: Self-pay | Admitting: *Deleted

## 2011-06-05 ENCOUNTER — Emergency Department (HOSPITAL_COMMUNITY)
Admission: EM | Admit: 2011-06-05 | Discharge: 2011-06-05 | Disposition: A | Discharge status: Home / Self Care | Payer: Self-pay | Attending: Emergency Medicine | Admitting: Emergency Medicine

## 2011-06-05 ENCOUNTER — Emergency Department (HOSPITAL_COMMUNITY): Payer: Self-pay

## 2011-06-05 DIAGNOSIS — R1012 Left upper quadrant pain: Secondary | ICD-10-CM | POA: Insufficient documentation

## 2011-06-05 HISTORY — DX: Dorsalgia, unspecified: M54.9

## 2011-06-05 HISTORY — DX: Gastro-esophageal reflux disease without esophagitis: K21.9

## 2011-06-05 HISTORY — DX: Polyneuropathy, unspecified: G62.9

## 2011-06-05 HISTORY — DX: Other chronic pain: G89.29

## 2011-06-05 LAB — COMPREHENSIVE METABOLIC PANEL
BUN: 13 mg/dL (ref 6–23)
CO2: 28 mEq/L (ref 19–32)
Calcium: 9.6 mg/dL (ref 8.4–10.5)
GFR calc Af Amer: >60 mL/min (ref >60)
GFR calc non Af Amer: >60 mL/min (ref >60)
Glucose, Bld: 112 mg/dL — ABNORMAL HIGH (ref 70–99)
Total Protein: 7.9 g/dL (ref 6.0–8.3)

## 2011-06-05 LAB — DIFFERENTIAL
Eosinophils Absolute: 0.2 10*3/uL (ref 0.0–0.7)
Eosinophils Relative: 2 % (ref 0–5)
Lymphs Abs: 2.6 10*3/uL (ref 0.7–4.0)
Monocytes Absolute: 0.6 10*3/uL (ref 0.1–1.0)
Monocytes Relative: 7 % (ref 3–12)

## 2011-06-05 LAB — URINALYSIS, ROUTINE W REFLEX MICROSCOPIC
Bilirubin Urine: NEGATIVE
Nitrite: NEGATIVE
Protein, ur: NEGATIVE mg/dL
Specific Gravity, Urine: 1.025 (ref 1.005–1.030)
Urobilinogen, UA: 0.2 mg/dL (ref 0.0–1.0)

## 2011-06-05 LAB — CBC
HCT: 44.5 % (ref 39.0–52.0)
Hemoglobin: 15.9 g/dL (ref 13.0–17.0)
MCH: 31.8 pg (ref 26.0–34.0)
MCV: 89 fL (ref 78.0–100.0)
RBC: 5 MIL/uL (ref 4.22–5.81)

## 2011-06-05 LAB — LIPASE, BLOOD: Lipase: 28 U/L (ref 11–59)

## 2011-06-05 MED ORDER — IOHEXOL 300 MG/ML  SOLN
100.0000 mL | Freq: Once | INTRAMUSCULAR | Status: AC | PRN
  Administered 2011-06-05: 100 mL via INTRAVENOUS

## 2011-06-05 MED ORDER — HYDROMORPHONE HCL 1 MG/ML IJ SOLN
1.0000 mg | Freq: Once | INTRAMUSCULAR | Status: AC
  Administered 2011-06-05: 1 mg via INTRAVENOUS
  Filled 2011-06-05: qty 1

## 2011-06-05 MED ORDER — ONDANSETRON HCL 4 MG/2ML IJ SOLN
4.0000 mg | Freq: Once | INTRAMUSCULAR | Status: AC
  Administered 2011-06-05: 4 mg via INTRAVENOUS
  Filled 2011-06-05: qty 2

## 2011-06-05 MED ORDER — HYDROCODONE-ACETAMINOPHEN 5-325 MG PO TABS: 1.0000 | ORAL_TABLET | ORAL | Status: AC | PRN

## 2011-06-05 MED ORDER — SODIUM CHLORIDE 0.9 % IV SOLN
Freq: Once | INTRAVENOUS | Status: AC
  Administered 2011-06-05: 12:00:00 via INTRAVENOUS

## 2011-06-05 MED ORDER — ONDANSETRON HCL 4 MG PO TABS: 4.0000 mg | ORAL_TABLET | Freq: Four times a day (QID) | ORAL | Status: AC

## 2011-06-05 NOTE — ED Notes (Signed)
Iv initally started in right ac. Blew past iv meds admistered

## 2011-06-05 NOTE — ED Notes (Signed)
Patient states that he is still having pain in his side. Patient was wanting something to eat and drink patient was told that the RN and MD would be notified.

## 2011-06-05 NOTE — ED Provider Notes (Addendum)
History     Chief Complaint  Patient presents with  . GI Problem   Patient is a 35 y.o. male presenting with abdominal pain. The history is provided by the patient. No language interpreter was used.  Abdominal Pain The primary symptoms of the illness include abdominal pain, nausea and vomiting. The primary symptoms of the illness do not include shortness of breath, diarrhea, hematemesis or hematochezia. Episode onset: Recurrence began 3 days ago, intial onset 2 months ago. The onset of the illness was sudden. The problem has not changed since onset. The abdominal pain is located in the LUQ. Pain radiation: left back. The abdominal pain is relieved by nothing. Exacerbated by: nothing.  Associated with: nothing. The patient has not had a change in bowel habit. Significant associated medical issues include GERD.   C/o recurrent LUQ abdominal pain radiating to left back with associated nausea. Patient notes same abdominal pain initially began 2 months ago, resolved, and then returned 3 days ago and has been persistent since. Patient states he is suppose to be on Nexium but has not had his medication for the last 1.5 weeks. Denies blood or mucus in stool, diarrhea, fever.H/o chronic back pain, GERD. Past Medical History  Diagnosis Date  . Neuropathy   . GERD (gastroesophageal reflux disease)   . Chronic back pain     Past Surgical History  Procedure Date  . Back surgery   . Hand surgery     Family History  Problem Relation Age of Onset  . Heart failure Mother   . Diabetes Father     History  Substance Use Topics  . Smoking status: Current Everyday Smoker -- 1.0 packs/day for 18 years    Types: Cigarettes  . Smokeless tobacco: Not on file  . Alcohol Use: No      Review of Systems  Respiratory: Negative for shortness of breath.   Gastrointestinal: Positive for nausea, vomiting and abdominal pain. Negative for diarrhea, blood in stool, hematochezia and hematemesis.  All other  systems reviewed and are negative.    Physical Exam  BP 136/82  Pulse 75  Temp(Src) 98.2 F (36.8 C) (Oral)  Resp 18  Ht 5\' 11"  (1.803 m)  Wt 398 lb (180.532 kg)  BMI 55.51 kg/m2  SpO2 97%  Physical Exam  Nursing note and vitals reviewed. Constitutional: He is oriented to person, place, and time. Vital signs are normal. He appears well-developed and well-nourished.  HENT:  Head: Normocephalic and atraumatic.  Eyes: Conjunctivae are normal.       Normal appearance  Neck: Normal range of motion. Neck supple.  Cardiovascular: Normal rate, regular rhythm, normal heart sounds, intact distal pulses and normal pulses.   No murmur heard. Pulmonary/Chest: Effort normal and breath sounds normal. He has no wheezes.  Abdominal: Soft. Bowel sounds are normal. He exhibits no distension. There is tenderness in the left upper quadrant.  Musculoskeletal: Normal range of motion. He exhibits no edema.  Neurological: He is alert and oriented to person, place, and time. No sensory deficit.  Skin: Skin is warm and dry.  Psychiatric: He has a normal mood and affect. His behavior is normal.    ED Course  Procedures Recheck-1:59 PM, patient informed of imaging and lab results- all normal. Intent to d/c home. MDM CT-Abdomen. Pain control. CMP. UA. CBC with diff.    No scribe involved of care of pt    Donnetta Hutching, MD 06/05/11 1419  Donnetta Hutching, MD 07/01/11 1538  Donnetta Hutching, MD  07/12/11 1329 

## 2011-06-05 NOTE — ED Notes (Signed)
Pt c/o LUQ abdominal pain x 3 days. Also c/o nausea, vomiting and diarrhea since last night.

## 2011-06-05 NOTE — ED Notes (Signed)
Pt puiled iv states the doctor told him he was going. Pt made aware that discharge paper work was not ready

## 2011-06-05 NOTE — ED Notes (Signed)
Pt complain of pain in left side of belly. States he was up all night vomiting up green bile

## 2011-06-11 ENCOUNTER — Ambulatory Visit (HOSPITAL_BASED_OUTPATIENT_CLINIC_OR_DEPARTMENT_OTHER): Payer: Self-pay | Attending: Internal Medicine

## 2011-08-19 LAB — CBC
MCV: 89.3
Platelets: 169
RBC: 5
WBC: 4.4

## 2011-08-19 LAB — POCT CARDIAC MARKERS
CKMB, poc: <1 — ABNORMAL LOW
CKMB, poc: <1 — ABNORMAL LOW
Myoglobin, poc: 45.9
Operator id: 228551
Troponin i, poc: <0.05
Troponin i, poc: <0.05

## 2011-08-19 LAB — POCT I-STAT, CHEM 8
Chloride: 101
Glucose, Bld: 80
HCT: 47
Hemoglobin: 16
Potassium: 4.6

## 2011-08-27 ENCOUNTER — Emergency Department (HOSPITAL_COMMUNITY): Admission: EM | Admit: 2011-08-27 | Discharge: 2011-08-27 | Discharge status: Against Medical Advice | Payer: Self-pay

## 2011-09-02 ENCOUNTER — Emergency Department (HOSPITAL_COMMUNITY)
Admission: EM | Admit: 2011-09-02 | Discharge: 2011-09-02 | Disposition: A | Discharge status: Home / Self Care | Payer: Self-pay | Attending: Emergency Medicine | Admitting: Emergency Medicine

## 2011-09-02 DIAGNOSIS — F319 Bipolar disorder, unspecified: Secondary | ICD-10-CM | POA: Insufficient documentation

## 2011-09-02 DIAGNOSIS — F909 Attention-deficit hyperactivity disorder, unspecified type: Secondary | ICD-10-CM | POA: Insufficient documentation

## 2011-09-02 DIAGNOSIS — M25579 Pain in unspecified ankle and joints of unspecified foot: Secondary | ICD-10-CM | POA: Insufficient documentation

## 2011-09-02 DIAGNOSIS — M542 Cervicalgia: Secondary | ICD-10-CM | POA: Insufficient documentation

## 2011-09-02 DIAGNOSIS — G8929 Other chronic pain: Secondary | ICD-10-CM | POA: Insufficient documentation

## 2011-09-02 DIAGNOSIS — E669 Obesity, unspecified: Secondary | ICD-10-CM | POA: Insufficient documentation

## 2011-09-02 DIAGNOSIS — K219 Gastro-esophageal reflux disease without esophagitis: Secondary | ICD-10-CM | POA: Insufficient documentation

## 2011-09-02 DIAGNOSIS — M549 Dorsalgia, unspecified: Secondary | ICD-10-CM | POA: Insufficient documentation

## 2011-09-10 LAB — URINALYSIS, ROUTINE W REFLEX MICROSCOPIC
Ketones, ur: 15 — AB
Nitrite: NEGATIVE
Protein, ur: NEGATIVE
Urobilinogen, UA: 1
pH: 6

## 2011-09-10 LAB — I-STAT 8, (EC8 V) (CONVERTED LAB)
Acid-Base Excess: 3 — ABNORMAL HIGH
Bicarbonate: 25.3 — ABNORMAL HIGH
Glucose, Bld: 104 — ABNORMAL HIGH
TCO2: 26
pCO2, Ven: 31.1 — ABNORMAL LOW
pH, Ven: 7.519 — ABNORMAL HIGH

## 2011-09-10 LAB — CBC
HCT: 47.7
Hemoglobin: 16.7
MCHC: 34.9
Platelets: 220
RDW: 11.8

## 2011-09-10 LAB — DIFFERENTIAL
Basophils Absolute: 0
Basophils Relative: 0
Eosinophils Relative: 9 — ABNORMAL HIGH
Lymphocytes Relative: 24
Monocytes Absolute: 1.1 — ABNORMAL HIGH
Neutro Abs: 6.1

## 2012-01-17 ENCOUNTER — Encounter (HOSPITAL_COMMUNITY): Payer: Self-pay | Admitting: *Deleted

## 2012-01-17 ENCOUNTER — Emergency Department (HOSPITAL_COMMUNITY)
Admission: EM | Admit: 2012-01-17 | Discharge: 2012-01-17 | Disposition: A | Discharge status: Home Health | Payer: Self-pay | Attending: Emergency Medicine | Admitting: Emergency Medicine

## 2012-01-17 DIAGNOSIS — R209 Unspecified disturbances of skin sensation: Secondary | ICD-10-CM | POA: Insufficient documentation

## 2012-01-17 DIAGNOSIS — K219 Gastro-esophageal reflux disease without esophagitis: Secondary | ICD-10-CM | POA: Insufficient documentation

## 2012-01-17 DIAGNOSIS — M542 Cervicalgia: Secondary | ICD-10-CM | POA: Insufficient documentation

## 2012-01-17 DIAGNOSIS — F17200 Nicotine dependence, unspecified, uncomplicated: Secondary | ICD-10-CM | POA: Insufficient documentation

## 2012-01-17 DIAGNOSIS — G8929 Other chronic pain: Secondary | ICD-10-CM

## 2012-01-17 NOTE — ED Notes (Signed)
Pt left before being D/C.

## 2012-01-17 NOTE — ED Notes (Signed)
States he has had numbness in his left arm for over 2 months, states he has been diagnosed with carpal tunnel syndrome and also has problems with his neck, refused to have an ekg and states he is sure it is not his heart, c/o pain in left hand

## 2012-01-17 NOTE — Discharge Instructions (Signed)
Chronic Pain Chronic pain can be defined as pain that is lasting, off and on, and lasts for 3 to 6 months or longer. Many things cause chronic pain, which can make it difficult to make a discrete diagnosis. There are many treatment options available for chronic pain. However, finding a treatment that works well for you may require trying various approaches until a suitable one is found. CAUSES  In some types of chronic medical conditions, the pain is caused by a normal pain response within the body. A normal pain response helps the body identify illness or injury and prevent further damage from being done. In these cases, the cause of the pain may be identified and treated, even if it may not be cured completely. Examples of chronic conditions which can cause chronic pain include:  Inflammation of the joints (arthritis).   Back pain or neck pain (including bulging or herniated disks).   Migraine headaches.   Cancer.  In some other types of chronic pain syndromes, the pain is caused by an abnormal pain response within the body. An abnormal pain response is present when there is no ongoing cause (or stimulus) for the pain, or when the cause of the pain is arising from the nerves or nervous system itself. Examples of conditions which can cause chronic pain due to an abnormal pain response include:  Fibromyalgia.   Reflex sympathetic dystrophy (RSD).   Neuropathy (when the nerves themselves are damaged, and may cause pain).  DIAGNOSIS  Your caregiver will help diagnose your condition over time. In many cases, the initial focus will be on excluding conditions that could be causing the pain. Depending on your symptoms, your caregiver may order some tests to diagnose your condition. Some of these tests include:  Blood tests.   Computerized X-ray scans (CT scan).   Computerized magnetic scans (MRI).   X-rays.   Ultrasounds.   Nerve conduction studies.   Consultation with other physicians or  specialists.  TREATMENT  There are many treatment options for people suffering from chronic pain. Finding a treatment that works well may take time.   You may be referred to a pain management specialist.   You may be put on medication to help with the pain. Unfortunately, some medications (such as opiate medications) may not be very effective in cases where chronic pain is due to abnormal pain responses. Finding the right medications can take some time.   Adjunctive therapies may be used to provide additional relief and improve a patient's quality of life. These therapies include:   Mindfulness meditation.   Acupuncture.   Biofeedback.   Cognitive-behavioral therapy.   In certain cases, surgical interventions may be attempted.  HOME CARE INSTRUCTIONS   Make sure you understand these instructions prior to discharge.   Ask any questions and share any further concerns you have with your caregiver prior to discharge.   Take all medications as directed by your caregiver.   Keep all follow-up appointments.  SEEK MEDICAL CARE IF:   Your pain gets worse.   You develop a new pain that was not present before.   You cannot tolerate any medications prescribed by your caregiver.   You develop new symptoms since your last visit with your caregiver.  SEEK IMMEDIATE MEDICAL CARE IF:   You develop muscular weakness.   You have decreased sensation or numbness.   You lose control of bowel or bladder function.   Your pain suddenly gets much worse.   You have an oral   temperature above 102 F (38.9 C), not controlled by medication.   You develop shaking chills, confusion, chest pain, or shortness of breath.  Document Released: 08/03/2002 Document Revised: 07/24/2011 Document Reviewed: 11/09/2008 [facility] Patient Information 2012 [place], [place].   The numbness in your left hand could be carpal tunnel but more likely is coming from the underlying neck problem.  Take ibuprofen 800 mg  every 8 hrs with food.  Follow up with getting the MRI of the cervical spine.  That may provide you with a lot of helpful information.

## 2012-01-17 NOTE — ED Provider Notes (Signed)
Medical screening examination/treatment/procedure(s) were performed by non-physician practitioner and as supervising physician I was immediately available for consultation/collaboration.   Zayvier Caravello, MD 01/17/12 2256 

## 2012-01-17 NOTE — ED Provider Notes (Signed)
History     CSN: [id number]  Arrival date & time 01/17/12  Barry Brunner   First MD Initiated Contact with Patient 01/17/12 2024      Chief Complaint  Patient presents with  . Neck Pain    (Consider location/radiation/quality/duration/timing/severity/associated sxs/prior treatment) HPI Comments: Pt has chronic neck pain and sees a neurologist AT [facility].  THEY RECENTLY ATTEMPTED AN mri OF HIS NECK THERE "BUT I FREAKED OUT".  He is attempting to get disability and is waiting for his attorney to re-schedule the MRI under sedation.  He was told years ago that he has carpal tunnel in his L hand but has not had any formal evaluation.  He came here tonight b/c he is concerned that he might have a blood clot.  He denies leg pain, CP, SOB, hemoptysis.    Patient is a 36 y.o. male presenting with neck pain. The history is provided by the patient.  Neck Pain  This is a chronic problem. Episode onset: L neck pain for several years.  L hand numbness for at least 2 years but worse over past 6 weeks. The problem occurs constantly. The problem has been gradually worsening. The pain is associated with nothing. The pain is moderate. Associated symptoms include numbness. He has tried nothing for the symptoms.    Past Medical History  Diagnosis Date  . Neuropathy   . GERD (gastroesophageal reflux disease)   . Chronic back pain     Past Surgical History  Procedure Date  . Back surgery   . Hand surgery     Family History  Problem Relation Age of Onset  . Heart failure Mother   . Diabetes Father     History  Substance Use Topics  . Smoking status: Current Everyday Smoker -- 1.0 packs/day for 18 years    Types: Cigarettes  . Smokeless tobacco: Not on file  . Alcohol Use: No      Review of Systems  HENT: Positive for neck pain.   Neurological: Positive for numbness.  All other systems reviewed and are negative.    Allergies  Review of patient's allergies indicates no known allergies.  Home  Medications  No current outpatient prescriptions on file.  BP 155/93  Pulse 91  Temp(Src) 97.3 F (36.3 C) (Oral)  Resp 18  Ht 5\' 11"  (1.803 m)  Wt 390 lb (176.903 kg)  BMI 54.39 kg/m2  SpO2 99%  Physical Exam  Nursing note and vitals reviewed. Constitutional: He is oriented to person, place, and time. He appears well-developed and well-nourished.  HENT:  Head: Normocephalic and atraumatic.  Eyes: EOM are normal.  Neck: Trachea normal and normal range of motion. Normal carotid pulses and no JVD present. Carotid bruit is not present.    Cardiovascular: Normal rate, regular rhythm, normal heart sounds and intact distal pulses.   Pulmonary/Chest: Effort normal and breath sounds normal. No respiratory distress.  Abdominal: Soft. He exhibits no distension. There is no tenderness.  Musculoskeletal: Normal range of motion.       Cervical back: He exhibits pain. He exhibits normal range of motion, no tenderness, no bony tenderness, no swelling, no deformity, no spasm and normal pulse.       Left hand: He exhibits normal range of motion, no tenderness, no bony tenderness, normal capillary refill, no deformity and no swelling. decreased sensation noted. Decreased sensation is present in the ulnar distribution, is present in the medial distribution and is present in the radial distribution. Normal strength noted.  Tinel's sign and Phalen's sign negative.  Good motor function.  Generalized "numbness" in L hand.  Neurological: He is alert and oriented to person, place, and time. No cranial nerve deficit. Coordination normal.  Skin: Skin is warm and dry.  Psychiatric: He has a normal mood and affect. Judgment normal.    ED Course  Procedures (including critical care time)  Labs Reviewed - No data to display No results found.   No diagnosis found.    MDM          Worthy Rancher, PA 01/17/12 2157

## 2012-03-31 ENCOUNTER — Emergency Department (HOSPITAL_COMMUNITY)
Admission: EM | Admit: 2012-03-31 | Discharge: 2012-03-31 | Disposition: A | Discharge status: Home / Self Care | Payer: Medicaid Other | Attending: Emergency Medicine | Admitting: Emergency Medicine

## 2012-03-31 ENCOUNTER — Encounter (HOSPITAL_COMMUNITY): Payer: Self-pay | Admitting: *Deleted

## 2012-03-31 DIAGNOSIS — F17200 Nicotine dependence, unspecified, uncomplicated: Secondary | ICD-10-CM | POA: Insufficient documentation

## 2012-03-31 DIAGNOSIS — M5430 Sciatica, unspecified side: Secondary | ICD-10-CM | POA: Insufficient documentation

## 2012-03-31 DIAGNOSIS — M545 Low back pain, unspecified: Secondary | ICD-10-CM | POA: Insufficient documentation

## 2012-03-31 DIAGNOSIS — M542 Cervicalgia: Secondary | ICD-10-CM

## 2012-03-31 DIAGNOSIS — G8929 Other chronic pain: Secondary | ICD-10-CM | POA: Insufficient documentation

## 2012-03-31 MED ORDER — OXYCODONE-ACETAMINOPHEN 5-325 MG PO TABS: 1.0000 | ORAL_TABLET | ORAL | Status: AC | PRN

## 2012-03-31 NOTE — Discharge Instructions (Signed)
You have been given a prescription for pain medication; this is a temporary fix; it is important for you to follow up with your doctors to get your MRI scheduled. Please use ice over the areas that are painful as well. Return to the ED if you develop trouble walking or otherwise worsening condition.  RESOURCE GUIDE  Dental Problems  Patients with Medicaid: [facility] [phone] [address]                                           [phone] [address] Phone:  [phone]                                                  Phone:  [phone]  If unable to pay or uninsured, contact:  [facility] or [facility]. to become qualified for the adult dental clinic.  Chronic Pain Problems Contact [facility]  [phone] Patients need to be referred by their primary care doctor.  Insufficient Money for Medicine Contact [employer]:  call "211" or [facility] 551-531-7080.  No Primary Care Doctor Call [facility]  [phone] Other agencies that provide inexpensive medical care    [facility]  [phone]    [facility]  [phone]    [facility]  [phone]    [facility]  [phone]    [facility]  [phone]    [facility]  [phone]  Psychological Services [facility]  [phone] [facility]  [phone] [facility] Mental [facility]   [phone] (emergency services (610)080-5385)  Substance Abuse Resources [facility]  [phone] [facility] (430)842-8826 [facility] 667-221-2546 Floydene Flock ([phone] Residential & Outpatient Substance Abuse Program  [phone]  Abuse/Neglect [facility] Child Abuse Hotline (574)241-7172 [facility] Child Abuse Hotline (431)038-6197 (After Hours)  Emergency Shelter [facility] 3120426702  M[facility] [place] [facility] (906)502-6430 [facility] [phone]  MRSA Hotline #:   5148839702    [facility] Resources  [facility]     [employer]                          [facility] Dept. [address] [place]                       [address]      [address]  [place]                                                Cristobal Goldmann Phone:  [phone]  Phone:  [phone]                 Phone:  [phone]  [facility] Phone:  [phone]  [facility] Child Abuse Hotline 857-649-0055 4356880733 (After Hours)  Chronic Back Pain When back pain lasts longer than 3 months, it is called chronic back pain.This pain can be frustrating, but the cause of the pain is rarely dangerous.People with chronic back pain often go through certain periods that are more intense (flare-ups). CAUSES Chronic back pain can be caused by wear and tear (degeneration) on different structures in your back. These structures may include bones, ligaments, or discs. This degeneration may result in more pressure being placed on the nerves that travel to your legs and feet. This can lead to pain traveling from the low back down the back of the legs. When pain lasts longer than 3 months, it is not unusual for people to experience anxiety or depression. Anxiety and depression can also contribute to low back pain. TREATMENT  Establish a regular exercise plan. This is critical to improving your functional level.   Have a self-management plan for when you flare-up. Flare-ups rarely require a medical visit. Regular exercise will help reduce the intensity and frequency of your flare-ups.   Manage how you feel about your back pain and the rest of your life. Anxiety, depression, and feeling that you cannot alter your back pain have been shown to make back pain more intense and  debilitating.   Medicines should never be your only treatment. They should be used along with other treatments to help you return to a more active lifestyle.   Procedures such as injections or surgery may be helpful but are rarely necessary. You may be able to get the same results with physical therapy or chiropractic care.  HOME CARE INSTRUCTIONS  Avoid bending, heavy lifting, prolonged sitting, and activities which make the problem worse.   Continue normal activity as much as possible.   Take brief periods of rest throughout the day to reduce your pain during flare-ups.   Follow your back exercise rehabilitation program. This can help reduce symptoms and prevent more pain.   Only take over-the-counter or prescription medicines as directed by your caregiver. Muscle relaxants are sometimes prescribed. Narcotic pain medicine is discouraged for long-term pain, since addiction is a possible outcome.   If you smoke, quit.   Eat healthy foods and maintain a recommended body weight.  SEEK IMMEDIATE MEDICAL CARE IF:   You have weakness or numbness in one of your legs or feet.   You have trouble controlling your bladder or bowels.   You develop nausea, vomiting, abdominal pain, shortness of breath, or fainting.  Document Released: 12/19/2004 Document Revised: 10/31/2011 Document Reviewed: 10/26/2011 [facility] Patient Information 2012 [place], [place].

## 2012-03-31 NOTE — ED Notes (Signed)
Pt in c/o back pain that radiates into neck and down right leg, history of same, states he leaned over couch last night and pain started back

## 2012-03-31 NOTE — ED Notes (Signed)
Pt reports pain to lower back with pain radiating down right leg.  Pt also reports neck pain on left side to shoulder. Pt reports stiffness and a stabbing sensation to shoulders. Pt reports he went to [facility] for the same and was told he had neck issue and was scheduled for an MRI of neck, but is unable to go until his insurance is straightened out.  Pt reports his back pain has been bothering him. Pt says he flipped his mattress over last night and then woke up this AM with increased back pain.  Pt ambulatory without issue. Pt took ibuprofen for pain three days ago.

## 2012-03-31 NOTE — ED Provider Notes (Signed)
History     CSN: [id number]  Arrival date & time 03/31/12  1345   First MD Initiated Contact with Patient 03/31/12 1626      Chief Complaint  Patient presents with  . Back Pain    (Consider location/radiation/quality/duration/timing/severity/associated sxs/prior treatment) HPI History from patient. 36 year old male with history of chronic back and neck pain who presents with the same. States he has had surgery on his lumbar spine in the past for a bulging disc, which was performed by Dr. Otelia Sergeant at [facility]. He has had repeat MRI on his back, which showed a recurrence of disc bulge at L4-L5. He has had some chronic, intermittent radicular pain, which worsened last night. States he went to flip a mattress on his bed, and had increased pain after this. He was unable to sleep well secondary to pain. He has not taken anything at home for this. Denies any difficulty with ambulation. Denies any saddle anesthesia, bowel/bladder dysfunction, urinary retention, fever/chills, abdominal pain.  Patient also complains of his chronic neck pain. States he was scheduled to get an MRI, with sedation, but lost his Medicaid coverage. He has regained this, and is in the process of getting the MRI set up again. Complains of pain shooting down into his hand, which seems to be worst in the thumb, palm, index and middle fingers. Pain does not worsen with repetitive motions. It is not remarkably changed since it began several months ago. He has been seen at [facility] for this in the past.  Past Medical History  Diagnosis Date  . Neuropathy   . GERD (gastroesophageal reflux disease)   . Chronic back pain     Past Surgical History  Procedure Date  . Back surgery   . Hand surgery     Family History  Problem Relation Age of Onset  . Heart failure Mother   . Diabetes Father     History  Substance Use Topics  . Smoking status: Current Everyday Smoker -- 1.0 packs/day for 18 years    Types: Cigarettes    . Smokeless tobacco: Not on file  . Alcohol Use: No      Review of Systems as per HPI  Allergies  Review of patient's allergies indicates no known allergies.  Home Medications   Current Outpatient Rx  Name Route Sig Dispense Refill  . OMEGA-3 FATTY ACIDS 1000 MG PO CAPS Oral Take 1 g by mouth daily.    . FUROSEMIDE 20 MG PO TABS Oral Take 20 mg by mouth daily.    . IBUPROFEN 400 MG PO TABS Oral Take 400 mg by mouth every 6 (six) hours as needed. For pain relief      BP 148/95  Pulse 76  Temp(Src) 98.2 F (36.8 C) (Oral)  Resp 16  SpO2 97%  Physical Exam  Nursing note and vitals reviewed. Constitutional: He appears well-developed and well-nourished. No distress.  HENT:  Head: Normocephalic and atraumatic.  Neck: Normal range of motion.  Cardiovascular: Normal rate.   Pulmonary/Chest: Effort normal.  Musculoskeletal: Normal range of motion.       Spine: No palpable stepoff, crepitus, or gross deformity appreciated. No appreciable spasm of paravertebral muscles. Midline tenderness at lower cervical spine, lower lumbar spine. Sensation grossly intact to lt touch in b/l LEs. Achilles reflexes 2+. Strength 5/5 on resisted dorsi/plantar flex b/l.   L hand: Negative Phalen's/Tinel's. Neurovascularly intact with sensory intact to light touch in radian, median, and ulnar distributions. Radial pulse intact. Capillary refill  less than 3 seconds.  Pain elicited with axial loading.  Neurological: He is alert.  Skin: Skin is warm and dry. He is not diaphoretic.  Psychiatric: He has a normal mood and affect.    ED Course  Procedures (including critical care time)  Labs Reviewed - No data to display No results found.   1. Chronic neck pain   2. Low back pain with sciatica, right       MDM  Patient presents with low back pain and neck pain. He has a chronic history of the same. He has no "red flag" symptoms. He is neurovascularly intact on exam. Discussed with him the  appropriateness of chronic pain management in the emergency department. Emphasized that is very important that he followup with his PCP to get his MRI set up. Return precautions discussed.        Grant Fontana, [place] 03/31/12 [phone]

## 2012-04-02 NOTE — ED Provider Notes (Signed)
Medical screening examination/treatment/procedure(s) were performed by non-physician practitioner and as supervising physician I was immediately available for consultation/collaboration.  Errin Whitelaw, MD 04/02/12 0700 

## 2012-05-19 ENCOUNTER — Other Ambulatory Visit: Payer: Self-pay | Admitting: Orthopaedic Surgery

## 2012-05-19 DIAGNOSIS — M542 Cervicalgia: Secondary | ICD-10-CM

## 2012-05-19 DIAGNOSIS — R200 Anesthesia of skin: Secondary | ICD-10-CM

## 2012-05-24 ENCOUNTER — Ambulatory Visit
Admission: RE | Admit: 2012-05-24 | Discharge: 2012-05-24 | Disposition: A | Discharge status: Home / Self Care | Payer: Medicaid Other | Source: Ambulatory Visit | Attending: Orthopaedic Surgery | Admitting: Orthopaedic Surgery

## 2012-05-24 DIAGNOSIS — R200 Anesthesia of skin: Secondary | ICD-10-CM

## 2012-05-24 DIAGNOSIS — M542 Cervicalgia: Secondary | ICD-10-CM

## 2012-05-27 ENCOUNTER — Other Ambulatory Visit: Payer: Self-pay | Admitting: Family Medicine

## 2012-05-27 DIAGNOSIS — M545 Low back pain: Secondary | ICD-10-CM

## 2012-06-03 ENCOUNTER — Ambulatory Visit
Admission: RE | Admit: 2012-06-03 | Discharge: 2012-06-03 | Disposition: A | Discharge status: Home / Self Care | Payer: Medicaid Other | Source: Ambulatory Visit | Attending: Family Medicine | Admitting: Family Medicine

## 2012-06-03 DIAGNOSIS — M545 Low back pain: Secondary | ICD-10-CM

## 2012-06-11 ENCOUNTER — Ambulatory Visit: Payer: Medicaid Other | Admitting: Physical Therapy

## 2012-06-23 ENCOUNTER — Other Ambulatory Visit: Payer: Self-pay | Admitting: Family Medicine

## 2012-06-23 DIAGNOSIS — M545 Low back pain: Secondary | ICD-10-CM

## 2012-06-23 DIAGNOSIS — R200 Anesthesia of skin: Secondary | ICD-10-CM

## 2012-06-23 DIAGNOSIS — M79604 Pain in right leg: Secondary | ICD-10-CM

## 2012-06-25 ENCOUNTER — Ambulatory Visit: Payer: Medicaid Other | Attending: Family Medicine | Admitting: Physical Therapy

## 2012-06-29 ENCOUNTER — Emergency Department (HOSPITAL_COMMUNITY)
Admission: EM | Admit: 2012-06-29 | Discharge: 2012-06-30 | Disposition: A | Discharge status: Home / Self Care | Payer: Medicaid Other | Attending: Emergency Medicine | Admitting: Emergency Medicine

## 2012-06-29 ENCOUNTER — Encounter (HOSPITAL_COMMUNITY): Payer: Self-pay | Admitting: Emergency Medicine

## 2012-06-29 ENCOUNTER — Other Ambulatory Visit: Payer: Medicaid Other

## 2012-06-29 DIAGNOSIS — G8929 Other chronic pain: Secondary | ICD-10-CM | POA: Insufficient documentation

## 2012-06-29 DIAGNOSIS — K219 Gastro-esophageal reflux disease without esophagitis: Secondary | ICD-10-CM | POA: Insufficient documentation

## 2012-06-29 DIAGNOSIS — F17200 Nicotine dependence, unspecified, uncomplicated: Secondary | ICD-10-CM | POA: Insufficient documentation

## 2012-06-29 DIAGNOSIS — M545 Low back pain, unspecified: Secondary | ICD-10-CM | POA: Insufficient documentation

## 2012-06-29 DIAGNOSIS — I10 Essential (primary) hypertension: Secondary | ICD-10-CM | POA: Insufficient documentation

## 2012-06-29 DIAGNOSIS — G589 Mononeuropathy, unspecified: Secondary | ICD-10-CM | POA: Insufficient documentation

## 2012-06-29 HISTORY — DX: Essential (primary) hypertension: I10

## 2012-06-29 MED ORDER — CYCLOBENZAPRINE HCL 10 MG PO TABS
10.0000 mg | ORAL_TABLET | Freq: Once | ORAL | Status: AC
  Administered 2012-06-29: 10 mg via ORAL
  Filled 2012-06-29: qty 1

## 2012-06-29 MED ORDER — CYCLOBENZAPRINE HCL 10 MG PO TABS: 10.0000 mg | ORAL_TABLET | Freq: Two times a day (BID) | ORAL | Status: AC | PRN

## 2012-06-29 MED ORDER — IBUPROFEN 800 MG PO TABS: 800.0000 mg | ORAL_TABLET | Freq: Three times a day (TID) | ORAL | Status: AC

## 2012-06-29 MED ORDER — OXYCODONE-ACETAMINOPHEN 5-325 MG PO TABS: 2.0000 | ORAL_TABLET | ORAL | Status: AC | PRN

## 2012-06-29 MED ORDER — KETOROLAC TROMETHAMINE 60 MG/2ML IM SOLN
60.0000 mg | Freq: Once | INTRAMUSCULAR | Status: AC
  Administered 2012-06-29: 60 mg via INTRAMUSCULAR
  Filled 2012-06-29: qty 2

## 2012-06-29 MED ORDER — HYDROMORPHONE HCL PF 1 MG/ML IJ SOLN
1.0000 mg | Freq: Once | INTRAMUSCULAR | Status: AC
  Administered 2012-06-29: 1 mg via INTRAMUSCULAR
  Filled 2012-06-29: qty 1

## 2012-06-29 NOTE — ED Notes (Addendum)
2 months ago, woke up and L thigh was numb, and one month ago, started to have pain and itching; reports sees orthopedic doc; also reports when turns head--eyes flash; and pain when swallowing; also reports pain in lower back; neuro intact; pt speech clear, face symmetrical, grips equal

## 2012-06-29 NOTE — ED Provider Notes (Signed)
History     CSN: [id number]  Arrival date & time 06/29/12  2114   First MD Initiated Contact with Patient 06/29/12 2314      Chief Complaint  Patient presents with  . Multiple Complaints     (Consider location/radiation/quality/duration/timing/severity/associated sxs/prior treatment) The history is provided by the patient.  Hx per PT. HA longstanding h/o neck and back problems followed by [facility] and is s/p lumbar surgery. Had MRI L spine 06/13/12 and understands that it was negative. He has ongoing persistent L thigh pain and numbness unchanged for the last 2 months. Persistent unchanged LBP across his lower back. No associated weakness or incontinence, no F/C, no N/V/D. No rash or swelling. He is on neurontin.  He is scheduled for lumbar injection next week and also has scheduled appt with pain Clinic. Pain sharp and mod in severity. Worse with mvmnt and position. No alleviating factors.  Past Medical History  Diagnosis Date  . Neuropathy   . GERD (gastroesophageal reflux disease)   . Chronic back pain   . Hypertension     Past Surgical History  Procedure Date  . Back surgery   . Hand surgery   . Tonsillectomy     Family History  Problem Relation Age of Onset  . Heart failure Mother   . Diabetes Father     History  Substance Use Topics  . Smoking status: Current Everyday Smoker -- 1.0 packs/day for 18 years    Types: Cigarettes  . Smokeless tobacco: Not on file  . Alcohol Use: No      Review of Systems  Constitutional: Negative for fever and chills.  HENT: Negative for neck pain and neck stiffness.   Eyes: Negative for pain.  Respiratory: Negative for shortness of breath.   Cardiovascular: Negative for chest pain.  Gastrointestinal: Negative for abdominal pain.  Genitourinary: Negative for dysuria.  Musculoskeletal: Positive for back pain.  Skin: Negative for rash.  Neurological: Negative for headaches.  All other systems reviewed and are  negative.    Allergies  Review of patient's allergies indicates no known allergies.  Home Medications   Current Outpatient Rx  Name Route Sig Dispense Refill  . AMPHETAMINE-DEXTROAMPHETAMINE 20 MG PO TABS Oral Take 20 mg by mouth 2 (two) times daily.    . OMEGA-3 FATTY ACIDS 1000 MG PO CAPS Oral Take 1 g by mouth daily.    . FUROSEMIDE 20 MG PO TABS Oral Take 20 mg by mouth daily.    Marland Kitchen GABAPENTIN 600 MG PO TABS Oral Take 600 mg by mouth 3 (three) times daily.    . OXYCODONE-ACETAMINOPHEN 7.5-325 MG PO TABS Oral Take 1-2 tablets by mouth every 4 (four) hours as needed. For pain      BP 132/89  Pulse 103  Temp 97.4 F (36.3 C) (Oral)  Resp 20  SpO2 98%  Physical Exam  Constitutional: He is oriented to person, place, and time. He appears well-developed and well-nourished.  HENT:  Head: Normocephalic and atraumatic.  Eyes: Conjunctivae and EOM are normal. Pupils are equal, round, and reactive to light.  Neck: Trachea normal. Neck supple.       No midline cervical tenderness  Cardiovascular: Normal rate, regular rhythm, S1 normal, S2 normal and normal pulses.     No systolic murmur is present   No diastolic murmur is present  Pulses:      Radial pulses are 2+ on the right side, and 2+ on the left side.  Pulmonary/Chest: Effort normal  and breath sounds normal. He has no wheezes. He has no rhonchi. He has no rales. He exhibits no tenderness.  Abdominal: Soft. Normal appearance and bowel sounds are normal. There is no tenderness. There is no CVA tenderness and negative Murphy's sign.  Musculoskeletal:       Mild lumbar tenderness without deformity. No lower extremity deficits with equal dorsi plantar flexion, hip flexion and knee extension. Sensorium to light touch equal and intact throughout. Localizes tenderness to left thigh without erythema or increased warmth to touch. Equal DTRs in lower extremities.  Neurological: He is alert and oriented to person, place, and time. He has  normal strength. No cranial nerve deficit or sensory deficit. GCS eye subscore is 4. GCS verbal subscore is 5. GCS motor subscore is 6.  Skin: Skin is warm and dry. No rash noted. He is not diaphoretic.  Psychiatric: His speech is normal.       Cooperative and appropriate    ED Course  Procedures (including critical care time)  IM toradol, Dilaudid.. Flexeril  1. Chronic pain    Chronic low back pain with recent MRI and followed by [place] [facility]. Is on Neurontin and narcotics questing prescription. States scheduled for pain clinic and lumbar injection next week. Plan discharge home prescriptions provided. Referrals provided by request. Written precautions provided and verbalized is understood.   MDM   Old records reviewed. Lumbar MRI dated 06/13/2012 reviewed without new changes. Nursing notes reviewed. Vital signs reviewed. Medications provided as above. Condition improved. No indication for emergent MRI at this time.        Sunnie Nielsen, MD 06/29/12 [phone]

## 2012-07-01 ENCOUNTER — Other Ambulatory Visit: Payer: Self-pay | Admitting: Family Medicine

## 2012-07-01 ENCOUNTER — Ambulatory Visit
Admission: RE | Admit: 2012-07-01 | Discharge: 2012-07-01 | Disposition: A | Discharge status: Home / Self Care | Payer: Medicaid Other | Source: Ambulatory Visit | Attending: Family Medicine | Admitting: Family Medicine

## 2012-07-01 VITALS — BP 149/82 | HR 80

## 2012-07-01 DIAGNOSIS — M545 Low back pain: Secondary | ICD-10-CM

## 2012-07-01 DIAGNOSIS — R200 Anesthesia of skin: Secondary | ICD-10-CM

## 2012-07-01 DIAGNOSIS — M79604 Pain in right leg: Secondary | ICD-10-CM

## 2012-07-01 DIAGNOSIS — M79605 Pain in left leg: Secondary | ICD-10-CM

## 2012-07-01 MED ORDER — IOHEXOL 180 MG/ML  SOLN
1.0000 mL | Freq: Once | INTRAMUSCULAR | Status: AC | PRN
  Administered 2012-07-01: 1 mL via EPIDURAL

## 2012-07-01 MED ORDER — METHYLPREDNISOLONE ACETATE 40 MG/ML INJ SUSP (RADIOLOG
120.0000 mg | Freq: Once | INTRAMUSCULAR | Status: AC
  Administered 2012-07-01: 120 mg via EPIDURAL

## 2012-07-15 ENCOUNTER — Emergency Department (HOSPITAL_COMMUNITY)
Admission: EM | Admit: 2012-07-15 | Discharge: 2012-07-15 | Disposition: A | Discharge status: Home / Self Care | Payer: Medicaid Other | Attending: Emergency Medicine | Admitting: Emergency Medicine

## 2012-07-15 ENCOUNTER — Encounter (HOSPITAL_COMMUNITY): Payer: Self-pay | Admitting: *Deleted

## 2012-07-15 DIAGNOSIS — M79609 Pain in unspecified limb: Secondary | ICD-10-CM | POA: Insufficient documentation

## 2012-07-15 DIAGNOSIS — I10 Essential (primary) hypertension: Secondary | ICD-10-CM | POA: Insufficient documentation

## 2012-07-15 DIAGNOSIS — K219 Gastro-esophageal reflux disease without esophagitis: Secondary | ICD-10-CM | POA: Insufficient documentation

## 2012-07-15 DIAGNOSIS — Z833 Family history of diabetes mellitus: Secondary | ICD-10-CM | POA: Insufficient documentation

## 2012-07-15 DIAGNOSIS — F17200 Nicotine dependence, unspecified, uncomplicated: Secondary | ICD-10-CM | POA: Insufficient documentation

## 2012-07-15 DIAGNOSIS — G8929 Other chronic pain: Secondary | ICD-10-CM | POA: Insufficient documentation

## 2012-07-15 DIAGNOSIS — M549 Dorsalgia, unspecified: Secondary | ICD-10-CM | POA: Insufficient documentation

## 2012-07-15 DIAGNOSIS — Z8249 Family history of ischemic heart disease and other diseases of the circulatory system: Secondary | ICD-10-CM | POA: Insufficient documentation

## 2012-07-15 MED ORDER — BACITRACIN 500 UNIT/GM EX OINT
1.0000 "application " | TOPICAL_OINTMENT | Freq: Two times a day (BID) | CUTANEOUS | Status: DC
  Filled 2012-07-15 (×2): qty 0.9

## 2012-07-15 NOTE — ED Provider Notes (Signed)
History   This chart was scribed for Doug Sou, MD by Charolett Bumpers . The patient was seen in room TR10C/TR10C. Patient's care was started at 1440.    CSN: [id number]  Arrival date & time 07/15/12  1245   First MD Initiated Contact with Patient 07/15/12 1440      Chief Complaint  Patient presents with  . Generalized Body Aches    (Consider location/radiation/quality/duration/timing/severity/associated sxs/prior treatment) HPI [name] is a 36 y.o. male who has a h/o neuropathy presents to the Emergency Department complaining of chronic, moderate bilaterally foot and leg pain. Pt states that he has a wound on his right foot that he noticed 3 days ago. Pt describes his pain as a sharp, shooting pain that feels like electricity across both legs for approximately one year.  Pt denies any injuries. Pt was seen here on 8/15 and given a prescription for Percocet. Pt is suppose to be in a pain clinic but has to reschedule. Pt is currently on Neurotin. Pt denies any h/o diabetes. Pt states that his Tetanus was updated in 2006. Pt denies any other injuries or complaints of pain at this time. Pt denies any pertinent medical and surgical hx. Pt denies tobacco, drug and alcohol use.    Past Medical History  Diagnosis Date  . Neuropathy   . GERD (gastroesophageal reflux disease)   . Chronic back pain   . Hypertension     Past Surgical History  Procedure Date  . Back surgery   . Hand surgery   . Tonsillectomy     Family History  Problem Relation Age of Onset  . Heart failure Mother   . Diabetes Father     History  Substance Use Topics  . Smoking status: Current Everyday Smoker -- 1.0 packs/day for 18 years    Types: Cigarettes  . Smokeless tobacco: Not on file  . Alcohol Use: No      Review of Systems  Constitutional: Negative.   HENT: Negative.   Respiratory: Negative.   Cardiovascular: Negative.   Gastrointestinal: Negative.   Musculoskeletal:  Positive for myalgias. Negative for gait problem.  Skin: Positive for wound.  Neurological: Negative.   Hematological: Negative.   Psychiatric/Behavioral: Negative.   All other systems reviewed and are negative.    Allergies  Review of patient's allergies indicates no known allergies.  Home Medications   Current Outpatient Rx  Name Route Sig Dispense Refill  . AMPHETAMINE-DEXTROAMPHETAMINE 20 MG PO TABS Oral Take 20 mg by mouth 2 (two) times daily.    . CELEBREX PO Oral Take 1 capsule by mouth daily.    . OMEGA-3 FATTY ACIDS 1000 MG PO CAPS Oral Take 1 g by mouth daily.    Marland Kitchen GABAPENTIN 600 MG PO TABS Oral Take 600 mg by mouth 3 (three) times daily.      BP 135/97  Pulse 90  Temp 98.1 F (36.7 C) (Oral)  Resp 18  SpO2 96%  Physical Exam  Nursing note and vitals reviewed. Constitutional: He appears well-developed and well-nourished.  HENT:  Head: Normocephalic and atraumatic.  Eyes: Conjunctivae are normal. Pupils are equal, round, and reactive to light.  Neck: Neck supple. No tracheal deviation present. No thyromegaly present.  Cardiovascular: Normal rate and regular rhythm.   No murmur heard. Pulmonary/Chest: Effort normal and breath sounds normal.  Abdominal: Soft. Bowel sounds are normal. He exhibits no distension. There is no tenderness.       Morbidly OBese  Musculoskeletal: Normal  range of motion. He exhibits no edema and no tenderness.  Neurological: He is alert. Coordination normal.  Skin: Skin is warm and dry. No rash noted.       dime sized clean-appearing abrasion at the dorsum of her right foot otherwise no lesions  Psychiatric: He has a normal mood and affect.    ED Course  Procedures (including critical care time)  DIAGNOSTIC STUDIES: Oxygen Saturation is 96% on room air, adequate by my interpretation.    COORDINATION OF CARE:  14:47-Discussed planned course of treatment with the patient, who is agreeable at this time.     Labs Reviewed - No  data to display No results found.   No diagnosis found.    MDM  Patient requests referral to podiatrist. [facility]  Local wound care to right foot Diagnosis #1 chronic pain Diagnosis #2 abrasion right foot   I personally performed the services described in this documentation, which was scribed in my presence. The recorded information has been reviewed and considered.     Doug Sou, MD 07/15/12 [phone]

## 2012-07-15 NOTE — ED Notes (Signed)
Checked in room, pt not present, per second RN, pt left without receiving discharge paperwork

## 2012-07-15 NOTE — ED Notes (Signed)
Pt is here with neck and arm pain and states has bilateral leg pain with wound on right foot.  PT states was going to pain clinic and is to call them back.  Pt has Neuropathy in LE but not diabetic

## 2012-07-15 NOTE — ED Notes (Signed)
Pt not present in room will continue to check in room

## 2012-09-23 ENCOUNTER — Emergency Department (HOSPITAL_COMMUNITY)
Admission: EM | Admit: 2012-09-23 | Discharge: 2012-09-23 | Disposition: A | Discharge status: Home / Self Care | Payer: Medicaid Other | Attending: Emergency Medicine | Admitting: Emergency Medicine

## 2012-09-23 ENCOUNTER — Encounter (HOSPITAL_COMMUNITY): Payer: Self-pay | Admitting: Emergency Medicine

## 2012-09-23 DIAGNOSIS — F17200 Nicotine dependence, unspecified, uncomplicated: Secondary | ICD-10-CM | POA: Insufficient documentation

## 2012-09-23 DIAGNOSIS — K219 Gastro-esophageal reflux disease without esophagitis: Secondary | ICD-10-CM | POA: Insufficient documentation

## 2012-09-23 DIAGNOSIS — M25539 Pain in unspecified wrist: Secondary | ICD-10-CM | POA: Insufficient documentation

## 2012-09-23 DIAGNOSIS — Z79899 Other long term (current) drug therapy: Secondary | ICD-10-CM | POA: Insufficient documentation

## 2012-09-23 DIAGNOSIS — I10 Essential (primary) hypertension: Secondary | ICD-10-CM | POA: Insufficient documentation

## 2012-09-23 DIAGNOSIS — G8929 Other chronic pain: Secondary | ICD-10-CM | POA: Insufficient documentation

## 2012-09-23 DIAGNOSIS — Z8669 Personal history of other diseases of the nervous system and sense organs: Secondary | ICD-10-CM | POA: Insufficient documentation

## 2012-09-23 DIAGNOSIS — M549 Dorsalgia, unspecified: Secondary | ICD-10-CM | POA: Insufficient documentation

## 2012-09-23 MED ORDER — OXYCODONE-ACETAMINOPHEN 7.5-325 MG PO TABS: 1.0000 | ORAL_TABLET | Freq: Four times a day (QID) | ORAL | Status: DC | PRN

## 2012-09-23 NOTE — Progress Notes (Signed)
Orthopedic Tech Progress Note Patient Details:  [name] [birth year] [id number]  Ortho Devices Type of Ortho Device: Velcro wrist splint Ortho Device/Splint Location: left wrist Ortho Device/Splint Interventions: Application   [name] 09/23/2012, 4:52 PM

## 2012-09-23 NOTE — ED Provider Notes (Signed)
History   This chart was scribed for Tobin Chad, MD by Albertha Ghee Rifaie. This patient was seen in room TR10C/TR10C and the patient's care was started at 3:36PM.   CSN: [id number]  Arrival date & time 09/23/12  1447   None     Chief Complaint  Patient presents with  . Hand Pain    The history is provided by the patient and the spouse. No language interpreter was used.    [name] is a 36 y.o. male who presents to the Emergency Department complaining of several weeks of gradually worsening, gradual onset, left shoulder pain that radiates to left hand, pain is sever and is constant. Pt states a hx of sx on left hand two months ago but denies any new injuries. [facility] for [facility] problems. Pt has not been working recently. He reports running out of his pain medication recently. Pt is a current everyday smoker but denies alcohol use.   Past Medical History  Diagnosis Date  . Neuropathy   . GERD (gastroesophageal reflux disease)   . Chronic back pain   . Hypertension     Past Surgical History  Procedure Date  . Back surgery   . Hand surgery   . Tonsillectomy     Family History  Problem Relation Age of Onset  . Heart failure Mother   . Diabetes Father     History  Substance Use Topics  . Smoking status: Current Every Day Smoker -- 1.0 packs/day for 18 years    Types: Cigarettes  . Smokeless tobacco: Not on file  . Alcohol Use: No      Review of Systems  Constitutional: Negative.   HENT: Negative.   Eyes: Negative.   Respiratory: Negative.   Cardiovascular: Negative.   Gastrointestinal: Negative.   Genitourinary: Negative.   Musculoskeletal:       LLE pain  Neurological: Negative.   Hematological: Negative.   Psychiatric/Behavioral: Negative.     Allergies  Review of patient's allergies indicates no known allergies.  Home Medications   Current Outpatient Rx  Name Route Sig Dispense Refill  .  AMPHETAMINE-DEXTROAMPHETAMINE 20 MG PO TABS Oral Take 20 mg by mouth 2 (two) times daily.    . CELEBREX PO Oral Take 1 capsule by mouth daily.    . OMEGA-3 FATTY ACIDS 1000 MG PO CAPS Oral Take 1 g by mouth daily.    Marland Kitchen GABAPENTIN 600 MG PO TABS Oral Take 600 mg by mouth 3 (three) times daily.      BP 167/100  Pulse 77  Temp 97.9 F (36.6 C) (Oral)  Resp 20  SpO2 97%  Physical Exam  Nursing note and vitals reviewed. Constitutional: He is oriented to person, place, and time. He appears well-developed and well-nourished. No distress.  HENT:  Head: Normocephalic and atraumatic.  Right Ear: External ear normal.  Left Ear: External ear normal.  Nose: Nose normal.  Mouth/Throat: Oropharynx is clear and moist. No oropharyngeal exudate.  Eyes: Conjunctivae normal and EOM are normal. Pupils are equal, round, and reactive to light. Right eye exhibits no discharge. Left eye exhibits no discharge. No scleral icterus.  Neck: Normal range of motion. Neck supple. No JVD present. No tracheal deviation present.  Cardiovascular: Normal rate, regular rhythm and normal heart sounds.   Pulmonary/Chest: Effort normal and breath sounds normal. No stridor. No respiratory distress. He has no wheezes. He has no rales. He exhibits no tenderness.  Abdominal: Soft. Bowel sounds  are normal. He exhibits no distension and no mass. There is no tenderness. There is no rebound and no guarding.       Obese, protuberant  Musculoskeletal: He exhibits tenderness. He exhibits no edema.       Left wrist: He exhibits decreased range of motion and tenderness. He exhibits no bony tenderness, no swelling, no effusion, no crepitus and no deformity.       Arms:      Note no snuffbox tenderness.  Negative finkelstein's test.  Tenderness to palp over volar aspect of the wrist.  No wrist drop noted.  Pt has intact interosseus muscle strength.  Lymphadenopathy:    He has no cervical adenopathy.  Neurological: He is alert and oriented  to person, place, and time. No cranial nerve deficit. He exhibits normal muscle tone. Coordination normal.  Skin: Skin is warm. No rash noted. He is not diaphoretic. No erythema. No pallor.  Psychiatric: He has a normal mood and affect. His behavior is normal.    ED Course  Procedures (including critical care time)  Labs Reviewed - No data to display No results found.  DIAGNOSTIC STUDIES: Oxygen Saturation is 97% on room air, adequate by my interpretation.    COORDINATION OF CARE: 4:33 PM Discussed treatment and discharge plan with pt at bedside and pt agreed to plan.     No diagnosis found.    MDM  Pt presents for evaluation of left hand and wrists pain.  He is s/p carpal tunnel; surgery and reports several other chronic pain issues.  He does have pain with palpation over the volar aspect of the wrist.  Note surgical scarring.  I cannot appreciate any atrophy of the muscles of the hand or significant weakness as compared to the other hand.  Plan pain mgmnt, place in a splint, encourage outpt follow-up with his orthopedic specialist.  He is just a few weeks removed from the procedure.   I personally performed the services described in this documentation, which was scribed in my presence. The recorded information has been reviewed and considered.      Tobin Chad, MD 09/23/12 [phone]

## 2012-09-23 NOTE — ED Notes (Signed)
Pt sts left hand pain into shoulder x several weeks; pt sts hx of sx on hand but denies new injury

## 2012-09-23 NOTE — ED Notes (Signed)
Wrist splint applied   By the ortho tech

## 2012-09-23 NOTE — ED Notes (Signed)
The pt has had pain in his lt wrist for one week.  He had a carpal tunnel release performed 2 months ago.  His wrist keeps locking up on him

## 2012-10-27 ENCOUNTER — Encounter (HOSPITAL_COMMUNITY): Payer: Self-pay | Admitting: Emergency Medicine

## 2012-10-27 ENCOUNTER — Emergency Department (HOSPITAL_COMMUNITY)
Admission: EM | Admit: 2012-10-27 | Discharge: 2012-10-27 | Disposition: A | Discharge status: Home / Self Care | Payer: Medicaid Other | Attending: Emergency Medicine | Admitting: Emergency Medicine

## 2012-10-27 DIAGNOSIS — M79671 Pain in right foot: Secondary | ICD-10-CM

## 2012-10-27 DIAGNOSIS — Z79899 Other long term (current) drug therapy: Secondary | ICD-10-CM | POA: Insufficient documentation

## 2012-10-27 DIAGNOSIS — M25539 Pain in unspecified wrist: Secondary | ICD-10-CM | POA: Insufficient documentation

## 2012-10-27 DIAGNOSIS — R209 Unspecified disturbances of skin sensation: Secondary | ICD-10-CM | POA: Insufficient documentation

## 2012-10-27 DIAGNOSIS — M549 Dorsalgia, unspecified: Secondary | ICD-10-CM | POA: Insufficient documentation

## 2012-10-27 DIAGNOSIS — G8929 Other chronic pain: Secondary | ICD-10-CM | POA: Insufficient documentation

## 2012-10-27 DIAGNOSIS — I10 Essential (primary) hypertension: Secondary | ICD-10-CM | POA: Insufficient documentation

## 2012-10-27 DIAGNOSIS — G5600 Carpal tunnel syndrome, unspecified upper limb: Secondary | ICD-10-CM | POA: Insufficient documentation

## 2012-10-27 DIAGNOSIS — F17200 Nicotine dependence, unspecified, uncomplicated: Secondary | ICD-10-CM | POA: Insufficient documentation

## 2012-10-27 DIAGNOSIS — E669 Obesity, unspecified: Secondary | ICD-10-CM | POA: Insufficient documentation

## 2012-10-27 DIAGNOSIS — Z8719 Personal history of other diseases of the digestive system: Secondary | ICD-10-CM | POA: Insufficient documentation

## 2012-10-27 DIAGNOSIS — M25579 Pain in unspecified ankle and joints of unspecified foot: Secondary | ICD-10-CM | POA: Insufficient documentation

## 2012-10-27 DIAGNOSIS — Z8669 Personal history of other diseases of the nervous system and sense organs: Secondary | ICD-10-CM | POA: Insufficient documentation

## 2012-10-27 LAB — GLUCOSE, CAPILLARY: Glucose-Capillary: 87 mg/dL (ref 70–99)

## 2012-10-27 MED ORDER — HYDROCODONE-ACETAMINOPHEN 10-325 MG PO TABS
1.0000 | ORAL_TABLET | Freq: Once | ORAL | Status: AC
  Administered 2012-10-27: 1 via ORAL

## 2012-10-27 MED ORDER — HYDROCODONE-ACETAMINOPHEN 10-325 MG PO TABS: 1.0000 | ORAL_TABLET | Freq: Three times a day (TID) | ORAL | Status: DC | PRN

## 2012-10-27 NOTE — ED Provider Notes (Signed)
History    This chart was scribed for [name], MD, MD by Smitty Pluck, ED Scribe. The patient was seen in room TR05C and the patient's care was started at 3:00PM.   CSN: [id number]  Arrival date & time 10/27/12  1423   None     Chief Complaint  Patient presents with  . Leg Pain     The history is provided by the patient. No language interpreter was used.   [name] is a 36 y.o. male who presents to the Emergency Department complaining of constant, moderate bilateral feet pain onset 2 weeks ago. Taken percocet and neurontin for pain without relief.Pt reports having neuropathy and carpal tunnel surgery of left wrist pain. Pt reports having HTN. He has numbness and tingling sensation in bilateral feet. He denies fever, chills, nausea, vomiting, diarrhea and any other pain.   Past Medical History  Diagnosis Date  . Neuropathy   . GERD (gastroesophageal reflux disease)   . Chronic back pain   . Hypertension     Past Surgical History  Procedure Date  . Back surgery   . Hand surgery   . Tonsillectomy     Family History  Problem Relation Age of Onset  . Heart failure Mother   . Diabetes Father     History  Substance Use Topics  . Smoking status: Current Every Day Smoker -- 1.0 packs/day for 18 years    Types: Cigarettes  . Smokeless tobacco: Not on file  . Alcohol Use: No      Review of Systems  Constitutional:       Per HPI, otherwise negative  HENT:       Per HPI, otherwise negative  Eyes: Negative.   Respiratory:       Per HPI, otherwise negative  Cardiovascular:       Per HPI, otherwise negative  Gastrointestinal: Negative for vomiting.  Genitourinary: Negative.   Musculoskeletal:       Per HPI, otherwise negative  Skin: Negative.   Neurological: Negative for syncope.    Allergies  Review of patient's allergies indicates no known allergies.  Home Medications   Current Outpatient Rx  Name  Route  Sig  Dispense  Refill  .  AMPHETAMINE-DEXTROAMPHETAMINE 20 MG PO TABS   Oral   Take 20 mg by mouth 2 (two) times daily.         . CELEBREX PO   Oral   Take 1 capsule by mouth daily.         Marland Kitchen GABAPENTIN 600 MG PO TABS   Oral   Take 600 mg by mouth 3 (three) times daily.         . OXYCODONE-ACETAMINOPHEN 7.5-325 MG PO TABS   Oral   Take 1 tablet by mouth every 6 (six) hours as needed for pain.   15 tablet   0     BP 162/96  Pulse 96  Temp 97.9 F (36.6 C) (Oral)  Resp 18  SpO2 95%  Physical Exam  Nursing note and vitals reviewed. Constitutional: He is oriented to person, place, and time. He appears well-developed. No distress.  HENT:  Head: Normocephalic and atraumatic.  Eyes: Conjunctivae normal and EOM are normal.  Cardiovascular: Normal rate, regular rhythm and normal heart sounds.   Pulmonary/Chest: Effort normal. No stridor. No respiratory distress.  Abdominal: He exhibits no distension.  Musculoskeletal: He exhibits no edema.       Good cap refill distally  Good pulses distally Appropriate  flexion and extension in bilateral feet Good Brudzinski sign bilaterally    Neurological: He is alert and oriented to person, place, and time.  Skin: Skin is warm and dry.  Psychiatric: He has a normal mood and affect.    ED Course  Procedures (including critical care time)   COORDINATION OF CARE: 3:05 PM Discussed ED treatment with pt     Labs Reviewed - No data to display No results found.   No diagnosis found.  BG <100  I searched the [place] drug database.  No narcotics in ~6wk.  MDM  I personally performed the services described in this documentation, which was scribed in my presence. The recorded information has been reviewed and is accurate.  The patient presents with concerns of ongoing bilateral foot pain.  On exam he is uncomfortable appearing, obese, but in no distress.  The patient is afebrile, has preserved sensation and neurologic function throughout extremities.   Given his description of a prior diagnosis of neuropathy, this seems consistent.  Absent fever, concerning changes, there is low suspicion for acute progression of systemic illness.  The patient was discharged with analgesics.   [name], MD 10/27/12 1544

## 2012-10-27 NOTE — ED Notes (Signed)
Pt c/o 10/10 sharp intermittent pain bilaterally to feet and legs unchanged when resting. Pt A&Ox4, ambulatory, nad.   Pt also requested an xray to left wrist d/t constant pain after carpal tunnel surgery 3mos ago.

## 2012-10-27 NOTE — ED Notes (Signed)
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

## 2012-12-21 ENCOUNTER — Emergency Department (HOSPITAL_COMMUNITY)
Admission: EM | Admit: 2012-12-21 | Discharge: 2012-12-21 | Disposition: A | Discharge status: Home / Self Care | Payer: Medicaid Other | Attending: Emergency Medicine | Admitting: Emergency Medicine

## 2012-12-21 ENCOUNTER — Encounter (HOSPITAL_COMMUNITY): Payer: Self-pay | Admitting: *Deleted

## 2012-12-21 DIAGNOSIS — G8929 Other chronic pain: Secondary | ICD-10-CM

## 2012-12-21 DIAGNOSIS — Z8669 Personal history of other diseases of the nervous system and sense organs: Secondary | ICD-10-CM | POA: Insufficient documentation

## 2012-12-21 DIAGNOSIS — F17200 Nicotine dependence, unspecified, uncomplicated: Secondary | ICD-10-CM | POA: Insufficient documentation

## 2012-12-21 DIAGNOSIS — M79609 Pain in unspecified limb: Secondary | ICD-10-CM | POA: Insufficient documentation

## 2012-12-21 DIAGNOSIS — IMO0001 Reserved for inherently not codable concepts without codable children: Secondary | ICD-10-CM | POA: Insufficient documentation

## 2012-12-21 DIAGNOSIS — Z791 Long term (current) use of non-steroidal anti-inflammatories (NSAID): Secondary | ICD-10-CM | POA: Insufficient documentation

## 2012-12-21 DIAGNOSIS — R51 Headache: Secondary | ICD-10-CM | POA: Insufficient documentation

## 2012-12-21 DIAGNOSIS — Z79899 Other long term (current) drug therapy: Secondary | ICD-10-CM | POA: Insufficient documentation

## 2012-12-21 DIAGNOSIS — Z8719 Personal history of other diseases of the digestive system: Secondary | ICD-10-CM | POA: Insufficient documentation

## 2012-12-21 DIAGNOSIS — I10 Essential (primary) hypertension: Secondary | ICD-10-CM | POA: Insufficient documentation

## 2012-12-21 MED ORDER — OXYCODONE-ACETAMINOPHEN 7.5-325 MG PO TABS: 1.0000 | ORAL_TABLET | ORAL | Status: DC | PRN

## 2012-12-21 MED ORDER — OXYCODONE-ACETAMINOPHEN 5-325 MG PO TABS
2.0000 | ORAL_TABLET | Freq: Once | ORAL | Status: AC
  Administered 2012-12-21: 2 via ORAL
  Filled 2012-12-21: qty 2

## 2012-12-21 NOTE — ED Provider Notes (Signed)
History   This chart was scribed for [name], MD by Charolett Bumpers, ED Scribe. The patient was seen in room TR11C/TR11C. Patient's care was started at 1717.   CSN: [id number]  Arrival date & time 12/21/12  1655   First MD Initiated Contact with Patient 12/21/12 1707      Chief Complaint  Patient presents with  . Back Pain    The history is provided by the patient. No language interpreter was used.  [name] is a 37 y.o. male with a history of chornic back and bilateral leg pain who presents to the Emergency Department complaining of constant, severe back pain that started a week ago and has been gradually worsening.  Pt reports associated headache, leg pain and foot pain.  Pt says sitting makes the pain worse and nothing makes the pain better. Pt says he took ibuprofen with no relief. He states that Percocet works for his pain normally but was last prescribed Vicodin. He states that he stopped taking the Vicodin 2 weeks because it upset his stomach. He states that he has been unable to schedule an appointment with his PCP since.  Past Medical History  Diagnosis Date  . Neuropathy   . GERD (gastroesophageal reflux disease)   . Chronic back pain   . Hypertension     Past Surgical History  Procedure Date  . Back surgery   . Hand surgery   . Tonsillectomy     Family History  Problem Relation Age of Onset  . Heart failure Mother   . Diabetes Father     History  Substance Use Topics  . Smoking status: Current Every Day Smoker -- 1.0 packs/day for 18 years    Types: Cigarettes  . Smokeless tobacco: Not on file  . Alcohol Use: No      Review of Systems  Musculoskeletal: Positive for myalgias and back pain.  All other systems reviewed and are negative.    Allergies  Vicodin  Home Medications   Current Outpatient Rx  Name  Route  Sig  Dispense  Refill  . AMPHETAMINE-DEXTROAMPHETAMINE 20 MG PO TABS   Oral   Take 20 mg by mouth 2 (two) times  daily.         . CELECOXIB 100 MG PO CAPS   Oral   Take 100 mg by mouth daily.         Marland Kitchen DIAZEPAM 5 MG PO TABS   Oral   Take 5 mg by mouth every 8 (eight) hours as needed. For muscle spasms         . GABAPENTIN 600 MG PO TABS   Oral   Take 600 mg by mouth 3 (three) times daily.         Marland Kitchen HYDROCHLOROTHIAZIDE 25 MG PO TABS   Oral   Take 25 mg by mouth daily.         Marland Kitchen HYDROCODONE-ACETAMINOPHEN 10-325 MG PO TABS   Oral   Take 1 tablet by mouth every 8 (eight) hours as needed for pain.   15 tablet   0   . OMEGA-3-ACID ETHYL ESTERS 1 G PO CAPS   Oral   Take 2 g by mouth 2 (two) times daily.           BP 151/107  Pulse 93  Temp 97.8 F (36.6 C)  Resp 18  SpO2 99%  Physical Exam  Nursing note and vitals reviewed. Constitutional: He is oriented to person, place, and time.  He appears well-developed and well-nourished. No distress.       obese  HENT:  Head: Normocephalic and atraumatic.  Eyes: Conjunctivae normal and EOM are normal.  Neck: Neck supple. No tracheal deviation present.  Cardiovascular: Normal rate, regular rhythm and normal heart sounds.   Pulmonary/Chest: Effort normal and breath sounds normal. No respiratory distress. He has no wheezes.  Musculoskeletal: Normal range of motion. He exhibits edema.       1+ pitting edema  Neurological: He is alert and oriented to person, place, and time.  Skin: Skin is warm and dry.  Psychiatric: He has a normal mood and affect. His behavior is normal.    ED Course  Procedures (including critical care time)  DIAGNOSTIC STUDIES: Oxygen Saturation is 99% on room air, normal by my interpretation.    COORDINATION OF CARE: 1824-Patient informed of current plan for treatment and evaluation and agrees with plan at this time.     1. Chronic pain       MDM  Exacerbation of chronic pain. This patient seems to be from his discontinuing hydrocodone on the advice of his neurologist. His records on the [place] controlled substance reporting website were reviewed and they do match his history. He'll be given a prescription for Percocet and is advised to follow up with his PCP. He states that he is looking for a new PCP, so he is given the phone number for [facility]. Old records are reviewed and he has several ED visits for chronic pain.     I personally performed the services described in this documentation, which was scribed in my presence. The recorded information has been reviewed and is accurate.       [name], MD 12/21/12 [phone]

## 2012-12-21 NOTE — ED Notes (Signed)
Reports chronic back and bilateral leg pain.  Pt ambulatory.  Denies change in pain.

## 2012-12-22 LAB — HEMOGLOBIN A1C: Hgb A1c MFr Bld: 5.5 % (ref <5.7)

## 2013-02-14 ENCOUNTER — Emergency Department (HOSPITAL_COMMUNITY)
Admission: EM | Admit: 2013-02-14 | Discharge: 2013-02-14 | Disposition: A | Discharge status: Home / Self Care | Payer: Medicaid Other | Attending: Emergency Medicine | Admitting: Emergency Medicine

## 2013-02-14 ENCOUNTER — Encounter (HOSPITAL_COMMUNITY): Payer: Self-pay

## 2013-02-14 DIAGNOSIS — R209 Unspecified disturbances of skin sensation: Secondary | ICD-10-CM | POA: Insufficient documentation

## 2013-02-14 DIAGNOSIS — I10 Essential (primary) hypertension: Secondary | ICD-10-CM | POA: Insufficient documentation

## 2013-02-14 DIAGNOSIS — Z79899 Other long term (current) drug therapy: Secondary | ICD-10-CM | POA: Insufficient documentation

## 2013-02-14 DIAGNOSIS — R5381 Other malaise: Secondary | ICD-10-CM | POA: Insufficient documentation

## 2013-02-14 DIAGNOSIS — Z8669 Personal history of other diseases of the nervous system and sense organs: Secondary | ICD-10-CM | POA: Insufficient documentation

## 2013-02-14 DIAGNOSIS — M79609 Pain in unspecified limb: Secondary | ICD-10-CM | POA: Insufficient documentation

## 2013-02-14 DIAGNOSIS — Z8719 Personal history of other diseases of the digestive system: Secondary | ICD-10-CM | POA: Insufficient documentation

## 2013-02-14 DIAGNOSIS — M545 Low back pain, unspecified: Secondary | ICD-10-CM | POA: Insufficient documentation

## 2013-02-14 DIAGNOSIS — F17200 Nicotine dependence, unspecified, uncomplicated: Secondary | ICD-10-CM | POA: Insufficient documentation

## 2013-02-14 DIAGNOSIS — M549 Dorsalgia, unspecified: Secondary | ICD-10-CM

## 2013-02-14 DIAGNOSIS — G8929 Other chronic pain: Secondary | ICD-10-CM | POA: Insufficient documentation

## 2013-02-14 MED ORDER — KETOROLAC TROMETHAMINE 60 MG/2ML IM SOLN
30.0000 mg | Freq: Once | INTRAMUSCULAR | Status: AC
  Administered 2013-02-14: 30 mg via INTRAMUSCULAR
  Filled 2013-02-14: qty 2

## 2013-02-14 MED ORDER — PROMETHAZINE HCL 25 MG PO TABS: 25.0000 mg | ORAL_TABLET | Freq: Four times a day (QID) | ORAL | Status: DC | PRN

## 2013-02-14 MED ORDER — OXYCODONE-ACETAMINOPHEN 5-325 MG PO TABS: 2.0000 | ORAL_TABLET | ORAL | Status: DC | PRN

## 2013-02-14 NOTE — ED Notes (Signed)
Pt states he was seen approximately 3 months ago with c/o back pain and leg pain.  Pt states he was given pain meds and told to follow up with his PCP.  Pt states he has not been able to get an appt with his PCP.  Pt presents today with c/o back pain and bilateral leg pain.  Pt ambulatory in ED waiting room and in NAD.

## 2013-02-14 NOTE — ED Provider Notes (Signed)
History     CSN: [id number]  Arrival date & time 02/14/13  1105   First MD Initiated Contact with Patient 02/14/13 1204      Chief Complaint  Patient presents with  . Back Pain  . Leg Pain    (Consider location/radiation/quality/duration/timing/severity/associated sxs/prior treatment) HPI Comments: This is a 37 year old male who presents today with back pain with radiation to both legs. The pain has been going on for many years. He has a history of spine surgery because of back pain. He states the pain in his back is worse on the right side and has been worsening significantly over the past week. He complains that his legs feel heavy and like they have cinderblocks in them. He has followed with a neurologist previously about this problem but has not seen them recently. He has not been able to see any doctors recently because of a problem with his insurance. He hopes to be referred to a new orthopedist today. He denies bowel or bladder incontinence, fever, chills, chest pain, shortness of breath, abdominal pain, nausea, or vomiting.   Patient is a 37 y.o. male presenting with back pain and leg pain. The history is provided by the patient. No language interpreter was used.  Back Pain Location:  Lumbar spine Quality:  Aching Radiates to:  L thigh, R thigh, R posterior upper leg, L posterior upper leg, L knee, R knee, R foot and L foot Pain severity:  Severe Pain is:  Same all the time Onset quality:  Gradual Duration:  1 week Timing:  Constant Progression:  Worsening Chronicity:  Chronic Context: not recent injury   Relieved by:  None tried Worsened by:  Movement Ineffective treatments:  None tried Associated symptoms: leg pain, numbness, tingling and weakness   Associated symptoms: no abdominal pain, no bladder incontinence, no bowel incontinence, no chest pain, no fever, no headaches and no perianal numbness   Leg Pain Location:  Leg Time since incident:  1 week Injury: no    Leg location:  L leg and R leg Pain details:    Quality: "heaviness"   Severity:  Severe   Onset quality:  Gradual   Duration:  1 week   Timing:  Constant   Progression:  Worsening Chronicity:  Chronic Dislocation: no   Foreign body present:  No foreign bodies Prior injury to area:  No Associated symptoms: back pain   Associated symptoms: no fever     Past Medical History  Diagnosis Date  . Neuropathy   . GERD (gastroesophageal reflux disease)   . Chronic back pain   . Hypertension     Past Surgical History  Procedure Laterality Date  . Hand surgery    . Tonsillectomy    . Back surgery      Family History  Problem Relation Age of Onset  . Heart failure Mother   . Diabetes Father     History  Substance Use Topics  . Smoking status: Current Every Day Smoker -- 1.00 packs/day for 18 years    Types: Cigarettes  . Smokeless tobacco: Not on file  . Alcohol Use: No      Review of Systems  Constitutional: Negative for fever and chills.  Respiratory: Negative for shortness of breath.   Cardiovascular: Negative for chest pain.  Gastrointestinal: Negative for nausea, vomiting, abdominal pain and bowel incontinence.  Genitourinary: Negative for bladder incontinence.  Musculoskeletal: Positive for back pain and arthralgias.  Neurological: Positive for tingling, weakness and numbness.  Negative for headaches.  All other systems reviewed and are negative.    Allergies  Vicodin  Home Medications   Current Outpatient Rx  Name  Route  Sig  Dispense  Refill  . celecoxib (CELEBREX) 100 MG capsule   Oral   Take 100 mg by mouth daily.         . diazepam (VALIUM) 5 MG tablet   Oral   Take 5 mg by mouth every 8 (eight) hours as needed. For muscle spasms         . gabapentin (NEURONTIN) 600 MG tablet   Oral   Take 600 mg by mouth 3 (three) times daily.         . hydrochlorothiazide (HYDRODIURIL) 25 MG tablet   Oral   Take 25 mg by mouth daily.          Marland Kitchen omega-3 acid ethyl esters (LOVAZA) 1 G capsule   Oral   Take 2 g by mouth 2 (two) times daily.         Marland Kitchen oxyCODONE-acetaminophen (PERCOCET/ROXICET) 5-325 MG per tablet   Oral   Take 2 tablets by mouth every 4 (four) hours as needed for pain.   6 tablet   0   . promethazine (PHENERGAN) 25 MG tablet   Oral   Take 1 tablet (25 mg total) by mouth every 6 (six) hours as needed for nausea.   12 tablet   0     BP 135/86  Pulse 66  Temp(Src) 97.6 F (36.4 C) (Oral)  Resp 20  SpO2 99%  Physical Exam  Nursing note and vitals reviewed. Constitutional: He is oriented to person, place, and time. Vital signs are normal. He appears well-developed and well-nourished. He does not have a sickly appearance. No distress.  HENT:  Head: Normocephalic and atraumatic.  Right Ear: External ear normal.  Left Ear: External ear normal.  Nose: Nose normal.  Eyes: Conjunctivae and lids are normal.  Neck: Normal range of motion. No tracheal deviation present.  Cardiovascular: Normal rate, regular rhythm, normal heart sounds, intact distal pulses and normal pulses.  Exam reveals no gallop and no friction rub.   No murmur heard. Pulmonary/Chest: Effort normal and breath sounds normal. No stridor. No respiratory distress. He has no wheezes. He has no rales.  Abdominal: Soft. He exhibits no distension.  Musculoskeletal: He exhibits tenderness.       Lumbar back: He exhibits tenderness. He exhibits no bony tenderness and no deformity.       Right lower leg: He exhibits edema (1+).       Left lower leg: He exhibits edema (1+).  Patient tender over musculature of right lower back - no bony tenderness  Straight leg raise attempted - pain elicited in all planes  Neurological: He is alert and oriented to person, place, and time. He has normal strength and normal reflexes. He displays normal reflexes. He exhibits normal muscle tone. Coordination and gait normal.  Skin: Skin is warm and dry. He is not  diaphoretic.  Psychiatric: He has a normal mood and affect. His behavior is normal.    ED Course  Procedures (including critical care time)  Labs Reviewed - No data to display No results found.   1. Back pain with radiation       MDM  Patient presented today in NAD for chronic back pain that has been worsening over the past week. No bony tenderness. No bowel or bladder incontinence. No concern for fx or cauda  equina. Previously followed with both neuro and ortho. Very strongly encouraged to go back to them for further evaluation. Given 6 Percocet for pain control at home. Return precautions given. Patient voiced understanding and agreed with the plan.         [name], PA-C 02/14/13 2039

## 2013-02-15 NOTE — ED Provider Notes (Signed)
Medical screening examination/treatment/procedure(s) were performed by non-physician practitioner and as supervising physician I was immediately available for consultation/collaboration.   Shelda Jakes, MD 02/15/13 [phone]

## 2013-02-21 ENCOUNTER — Emergency Department (HOSPITAL_COMMUNITY)
Admission: EM | Admit: 2013-02-21 | Discharge: 2013-02-21 | Discharge status: Against Medical Advice | Payer: Medicaid Other | Attending: Emergency Medicine | Admitting: Emergency Medicine

## 2013-02-21 ENCOUNTER — Encounter (HOSPITAL_COMMUNITY): Payer: Self-pay | Admitting: *Deleted

## 2013-02-21 DIAGNOSIS — M549 Dorsalgia, unspecified: Secondary | ICD-10-CM | POA: Insufficient documentation

## 2013-02-21 DIAGNOSIS — M79609 Pain in unspecified limb: Secondary | ICD-10-CM | POA: Insufficient documentation

## 2013-02-21 NOTE — ED Notes (Signed)
Name called no answer 

## 2013-02-21 NOTE — ED Notes (Signed)
Pt reports being seen here recently for pain to legs and back, was referred to ortho dr  But they are unable to see him until next mon, he is still having pain and also wants a new referral. Ambulatory at triage, no acute distress noted.

## 2013-02-21 NOTE — ED Notes (Signed)
Pt called for room in [facility]. No answer x 1

## 2013-02-21 NOTE — ED Notes (Signed)
No response to pt name being in waiting room x 1

## 2013-02-21 NOTE — ED Notes (Signed)
Unable to locate patient in waiting room.

## 2013-02-21 NOTE — ED Notes (Signed)
Pt also called in Peds waiting room; no answer

## 2013-04-08 ENCOUNTER — Emergency Department (HOSPITAL_COMMUNITY)
Admission: EM | Admit: 2013-04-08 | Discharge: 2013-04-08 | Disposition: A | Discharge status: Home / Self Care | Payer: Self-pay | Attending: Emergency Medicine | Admitting: Emergency Medicine

## 2013-04-08 ENCOUNTER — Encounter (HOSPITAL_COMMUNITY): Payer: Self-pay | Admitting: Emergency Medicine

## 2013-04-08 DIAGNOSIS — F17200 Nicotine dependence, unspecified, uncomplicated: Secondary | ICD-10-CM | POA: Insufficient documentation

## 2013-04-08 DIAGNOSIS — M545 Low back pain, unspecified: Secondary | ICD-10-CM | POA: Insufficient documentation

## 2013-04-08 DIAGNOSIS — M79609 Pain in unspecified limb: Secondary | ICD-10-CM | POA: Insufficient documentation

## 2013-04-08 DIAGNOSIS — R110 Nausea: Secondary | ICD-10-CM | POA: Insufficient documentation

## 2013-04-08 DIAGNOSIS — Z8719 Personal history of other diseases of the digestive system: Secondary | ICD-10-CM | POA: Insufficient documentation

## 2013-04-08 DIAGNOSIS — E119 Type 2 diabetes mellitus without complications: Secondary | ICD-10-CM | POA: Insufficient documentation

## 2013-04-08 DIAGNOSIS — I10 Essential (primary) hypertension: Secondary | ICD-10-CM | POA: Insufficient documentation

## 2013-04-08 DIAGNOSIS — G8929 Other chronic pain: Secondary | ICD-10-CM | POA: Insufficient documentation

## 2013-04-08 DIAGNOSIS — Z8669 Personal history of other diseases of the nervous system and sense organs: Secondary | ICD-10-CM | POA: Insufficient documentation

## 2013-04-08 DIAGNOSIS — R51 Headache: Secondary | ICD-10-CM | POA: Insufficient documentation

## 2013-04-08 DIAGNOSIS — M549 Dorsalgia, unspecified: Secondary | ICD-10-CM

## 2013-04-08 DIAGNOSIS — Z79899 Other long term (current) drug therapy: Secondary | ICD-10-CM | POA: Insufficient documentation

## 2013-04-08 MED ORDER — OXYCODONE-ACETAMINOPHEN 5-325 MG PO TABS
2.0000 | ORAL_TABLET | Freq: Once | ORAL | Status: AC
  Administered 2013-04-08: 2 via ORAL
  Filled 2013-04-08: qty 2

## 2013-04-08 MED ORDER — OXYCODONE-ACETAMINOPHEN 5-325 MG PO TABS: 2.0000 | ORAL_TABLET | ORAL | Status: DC | PRN

## 2013-04-08 MED ORDER — PROMETHAZINE HCL 25 MG PO TABS: 25.0000 mg | ORAL_TABLET | Freq: Four times a day (QID) | ORAL | Status: DC | PRN

## 2013-04-08 MED ORDER — ONDANSETRON 4 MG PO TBDP
8.0000 mg | ORAL_TABLET | Freq: Once | ORAL | Status: AC
  Administered 2013-04-08: 8 mg via ORAL
  Filled 2013-04-08: qty 2

## 2013-04-08 NOTE — ED Provider Notes (Signed)
History    This chart was scribed for [name] (PA) non-physician practitioner working with Bobby Munch, MD by [name], ED Scribe. This patient was seen in room TR11C/TR11C and the patient's care was started at 3:05PM.   CSN: [id number]  Arrival date & time 04/08/13  1442   First MD Initiated Contact with Patient 04/08/13 1505      Chief Complaint  Patient presents with  . Back Pain  . Leg Pain    (Consider location/radiation/quality/duration/timing/severity/associated sxs/prior treatment) The history is provided by the patient. No language interpreter was used.    [name] is a 37 y.o. male , with a hx of neuropathy, GERD, chronic back pain, and hypertension, who presents to the Emergency Department complaining of chronic, progressively worsening, back pain located at the lumbar region, radiating downwards towards the bilateral hips and bilateral lower extremities, onset 7 days ago (01/30/13).  Associated symptoms include diffuse headache and nausea. The pt reports he has been evaluated in the past with regards to his back and bilateral lower extremity pain, however, he has not yet received his medicaid, and has been unable to seek pain management elsewhere. The pt informs his lumbar back pain radiates downwards towards his bilateral lower extremities, intensified by ambulation, and standing on his feet for prolonged periods of time. The pt has taken Vicodin in the past, which he informs, makes him nauseous and does not provide relief of his back pain.  The pt denies fever, difficulty breathing, nausea, and vomiting. Furthermore, the pt denies any hx of cancer.   The pt is a current everyday smoker, however, he does not drink alcohol.   Pt does not have a PCP.   Past Medical History  Diagnosis Date  . Neuropathy   . GERD (gastroesophageal reflux disease)   . Chronic back pain   . Hypertension     Past Surgical History  Procedure Laterality Date  . Hand  surgery    . Tonsillectomy    . Back surgery      Family History  Problem Relation Age of Onset  . Heart failure Mother   . Diabetes Father     History  Substance Use Topics  . Smoking status: Current Every Day Smoker -- 1.00 packs/day for 18 years    Types: Cigarettes  . Smokeless tobacco: Not on file  . Alcohol Use: No      Review of Systems  Constitutional: Negative for fever.  Respiratory: Negative for shortness of breath.   Gastrointestinal: Positive for nausea. Negative for vomiting.  Musculoskeletal: Positive for back pain.  Neurological: Positive for headaches.  All other systems reviewed and are negative.    Allergies  Tramadol and Vicodin  Home Medications   Current Outpatient Rx  Name  Route  Sig  Dispense  Refill  . diazepam (VALIUM) 5 MG tablet   Oral   Take 5 mg by mouth every 8 (eight) hours as needed (muscle spasms). For muscle spasms         . Ibuprofen (IBU PO)   Oral   Take 1-2 tablets by mouth every 6 (six) hours as needed (pain).         Marland Kitchen omega-3 acid ethyl esters (LOVAZA) 1 G capsule   Oral   Take 1 g by mouth 2 (two) times daily.            BP 132/87  Pulse 98  Temp(Src) 98.2 F (36.8 C) (Oral)  Resp 18  Ht 5'  11" (1.803 m)  Wt 370 lb (167.831 kg)  BMI 51.63 kg/m2  SpO2 99%  Physical Exam  Nursing note and vitals reviewed. Constitutional: He is oriented to person, place, and time. He appears well-developed and well-nourished. No distress.  HENT:  Head: Normocephalic and atraumatic.  Right Ear: External ear normal.  Left Ear: External ear normal.  Nose: Nose normal.  Eyes: Conjunctivae are normal.  Neck: Normal range of motion. No tracheal deviation present.  Cardiovascular: Normal rate, regular rhythm, normal heart sounds and intact distal pulses.   Pulmonary/Chest: Effort normal and breath sounds normal. No stridor.  Abdominal: Soft. He exhibits no distension. There is no tenderness.  Musculoskeletal: Normal  range of motion.       Thoracic back: He exhibits tenderness.       Lumbar back: He exhibits tenderness.  Tenderness to palpitation over thoracic and lumbar spine. No step offs nor deformity. Distal pulses intact.   Neurological: He is alert and oriented to person, place, and time.  Skin: Skin is warm and dry. He is not diaphoretic.  Psychiatric: He has a normal mood and affect. His behavior is normal.    ED Course  Procedures (including critical care time)  DIAGNOSTIC STUDIES: Oxygen Saturation is 99% on room air, normal by my interpretation.    COORDINATION OF CARE:  4:00PM- Treatment plan discussed with pt. Pt agrees with treatment.    Labs Reviewed - No data to display No results found.   1. Chronic pain   2. Back pain       MDM  Patient is an unfortunate 37 year old male who presents with chronic back pain. No new injury. No neurological deficits and normal neuro exam.  Patient can walk but states is painful.  No loss of bowel or bladder control.  No concern for cauda equina.  No fever, night sweats, weight loss, h/o cancer, IVDU.  RICE protocol and pain medicine alternative reviewed with the patient. A review of the [place] Controlled Substance Abuse Database is consistent with his story.   Discussed with pt that since earlier visit showed no fractures, subluxation, or other serious osseous lesions, that pain is likely as a result of soft tissue injury sustained during initial injury. A treatment plan including ortho referral, resource guide, ice was discussed and pt agreed.  I discussed, again, follow up with the specialists referred to during previous visits and that therapeutic exercise might be beneficial.       I personally performed the services described in this documentation, which was scribed in my presence. The recorded information has been reviewed and is accurate.    [name], PA-C 04/09/13 1524

## 2013-04-08 NOTE — ED Notes (Signed)
Pt states he has a hx of back pain, he had surgery in 2006 on his back. Pt states he has had pain for a bout a week in his lower back, down his right leg, and in his head. Pt states his right toes are numb. Pt states his lower back feels locked up.

## 2013-04-10 NOTE — ED Provider Notes (Signed)
Medical screening examination/treatment/procedure(s) were performed by non-physician practitioner and as supervising physician I was immediately available for consultation/collaboration.   Jazlynne Milliner, MD 04/10/13 0715 

## 2013-04-29 ENCOUNTER — Emergency Department (HOSPITAL_COMMUNITY)
Admission: EM | Admit: 2013-04-29 | Discharge: 2013-04-29 | Disposition: A | Discharge status: Home / Self Care | Payer: Self-pay | Attending: Emergency Medicine | Admitting: Emergency Medicine

## 2013-04-29 ENCOUNTER — Encounter (HOSPITAL_COMMUNITY): Payer: Self-pay | Admitting: Family Medicine

## 2013-04-29 DIAGNOSIS — IMO0001 Reserved for inherently not codable concepts without codable children: Secondary | ICD-10-CM | POA: Insufficient documentation

## 2013-04-29 DIAGNOSIS — F17200 Nicotine dependence, unspecified, uncomplicated: Secondary | ICD-10-CM | POA: Insufficient documentation

## 2013-04-29 DIAGNOSIS — Z8739 Personal history of other diseases of the musculoskeletal system and connective tissue: Secondary | ICD-10-CM | POA: Insufficient documentation

## 2013-04-29 DIAGNOSIS — I10 Essential (primary) hypertension: Secondary | ICD-10-CM | POA: Insufficient documentation

## 2013-04-29 DIAGNOSIS — G8929 Other chronic pain: Secondary | ICD-10-CM | POA: Insufficient documentation

## 2013-04-29 DIAGNOSIS — G629 Polyneuropathy, unspecified: Secondary | ICD-10-CM

## 2013-04-29 DIAGNOSIS — Z8719 Personal history of other diseases of the digestive system: Secondary | ICD-10-CM | POA: Insufficient documentation

## 2013-04-29 DIAGNOSIS — G5790 Unspecified mononeuropathy of unspecified lower limb: Secondary | ICD-10-CM | POA: Insufficient documentation

## 2013-04-29 DIAGNOSIS — R209 Unspecified disturbances of skin sensation: Secondary | ICD-10-CM | POA: Insufficient documentation

## 2013-04-29 MED ORDER — OXYCODONE-ACETAMINOPHEN 5-325 MG PO TABS
2.0000 | ORAL_TABLET | Freq: Once | ORAL | Status: AC
  Administered 2013-04-29: 2 via ORAL
  Filled 2013-04-29: qty 2

## 2013-04-29 NOTE — ED Notes (Signed)
Pt has chronic pain in legs. Has been seen by neurology but they will no longer accept him due to insurance. Pt states he has "neuropathy".

## 2013-04-29 NOTE — ED Notes (Signed)
Per pt sts has chronic pain in legs and feet and cant be seen by doctor due to lack of insurance.

## 2013-04-29 NOTE — ED Provider Notes (Signed)
History     CSN: [id number]  Arrival date & time 04/29/13  4098   First MD Initiated Contact with Patient 04/29/13 1919      Chief Complaint  Patient presents with  . Leg Pain    (Consider location/radiation/quality/duration/timing/severity/associated sxs/prior treatment) HPI Comments: Patient with h/o chronic pain, neuropathy in feet bilaterally, no dx of diabetes, states he has been worked up by neurology and ortho in past -- presents with worsening of burning foot pain x 2 days. Worse with palpitation and walking. No new injury. States any palpation or bearing weight is killing him. States he has been using tylenol without relief. Denies using Vicodin. When asked about his chronic prescriptions, he admits to getting Vicodin from Dr. Mikeal Hawthorne, but does not take it because it makes him vomit. States his PCP won't change to percocet, which he is requesting. When asked why he fills this, he states 'because my lawyers told me to'. I asked him about RX filled 04/15/13 and he states 'oh, my mother must have filled those'. He admits to last using Vicodin two weeks ago. Patient states pain is at his baseline. No new weakness, numbness, or tingling -- just that pain is worse. He states he has trouble walking. The onset of this condition was incidious. The course is constant. Aggravating factors: walking, palpation. Alleviating factors: none.    Patient is a 37 y.o. male presenting with leg pain. The history is provided by the patient.  Leg Pain Associated symptoms: no back pain and no neck pain     Past Medical History  Diagnosis Date  . Neuropathy   . GERD (gastroesophageal reflux disease)   . Chronic back pain   . Hypertension     Past Surgical History  Procedure Laterality Date  . Hand surgery    . Tonsillectomy    . Back surgery      Family History  Problem Relation Age of Onset  . Heart failure Mother   . Diabetes Father     History  Substance Use Topics  . Smoking status:  Current Every Day Smoker -- 1.00 packs/day for 18 years    Types: Cigarettes  . Smokeless tobacco: Not on file  . Alcohol Use: No      Review of Systems  Constitutional: Negative for activity change.  HENT: Negative for neck pain.   Cardiovascular: Negative for leg swelling.  Musculoskeletal: Positive for myalgias. Negative for back pain, joint swelling, arthralgias and gait problem.  Skin: Negative for wound.  Neurological: Positive for numbness. Negative for weakness.    Allergies  Tramadol and Vicodin  Home Medications   Current Outpatient Rx  Name  Route  Sig  Dispense  Refill  . diazepam (VALIUM) 5 MG tablet   Oral   Take 5 mg by mouth every 8 (eight) hours as needed (muscle spasms).          Marland Kitchen ibuprofen (ADVIL,MOTRIN) 200 MG tablet   Oral   Take 400 mg by mouth 2 (two) times daily as needed for pain.         . Omega-3 Fatty Acids (FISH OIL) 1200 MG CAPS   Oral   Take 1,200 mg by mouth daily.           BP 144/96  Pulse 71  Temp(Src) 98.3 F (36.8 C)  Resp 18  SpO2 98%  Physical Exam  Nursing note and vitals reviewed. Constitutional: He appears well-developed and well-nourished.  HENT:  Head: Normocephalic and atraumatic.  Eyes: Conjunctivae are normal.  Neck: Normal range of motion.  Cardiovascular:  Pulses:      Dorsalis pedis pulses are 2+ on the right side, and 2+ on the left side.       Posterior tibial pulses are 2+ on the right side, and 2+ on the left side.  Abdominal: Soft. There is no tenderness. There is no CVA tenderness.  Musculoskeletal: Normal range of motion. He exhibits no tenderness.  No step-off noted with palpation of spine.   Neurological: He is alert. He has normal reflexes. No sensory deficit. He exhibits normal muscle tone.  5/5 strength in entire lower extremities bilaterally. States present but decreased sensation on feet bilaterally to light touch. Winces with any touch.   Skin: Skin is warm and dry.  Psychiatric: He  has a normal mood and affect.    ED Course  Procedures (including critical care time)  Labs Reviewed - No data to display No results found.   1. Neuropathy   2. Chronic pain     7:51 PM Patient seen and examined.    Vital signs reviewed and are as follows: Filed Vitals:   04/29/13 1837  BP: 144/96  Pulse: 71  Temp: 98.3 F (36.8 C)  Resp: 18   Pain medication given here. Patient told that he will need PCP to rx long-term chronic pain medication.  Patient was upset that he would not be receiving prescription.    Patient up in ED and walking without difficulty at time of discharge.    MDM  Patient with h/o chronic pain. States he is allergic to Vicodin however continues to fill #120 tabs every month. Most recently #120 Vicodin 7.5/325 on 04/15/2013. Inconsistent story during patient interview. Patient told we cannot treat chronic pain in ED. No emergent conditions identified. Referred to PCP.         Renne Crigler, PA-C 05/01/13 0004

## 2013-04-29 NOTE — ED Notes (Signed)
Pt discharged.GCS 15 and vital signs stable.Pt and his wife very upset about the discharge and they are not happy with the treatment.

## 2013-05-01 NOTE — ED Provider Notes (Signed)
Medical screening examination/treatment/procedure(s) were performed by non-physician practitioner and as supervising physician I was immediately available for consultation/collaboration.   Shelda Jakes, MD 05/01/13 [phone]

## 2013-06-11 ENCOUNTER — Encounter (HOSPITAL_COMMUNITY): Payer: Self-pay | Admitting: Emergency Medicine

## 2013-06-11 ENCOUNTER — Emergency Department (HOSPITAL_COMMUNITY)
Admission: EM | Admit: 2013-06-11 | Discharge: 2013-06-11 | Disposition: A | Discharge status: Home / Self Care | Payer: Self-pay | Attending: Emergency Medicine | Admitting: Emergency Medicine

## 2013-06-11 DIAGNOSIS — IMO0002 Reserved for concepts with insufficient information to code with codable children: Secondary | ICD-10-CM | POA: Insufficient documentation

## 2013-06-11 DIAGNOSIS — F17200 Nicotine dependence, unspecified, uncomplicated: Secondary | ICD-10-CM | POA: Insufficient documentation

## 2013-06-11 DIAGNOSIS — G8929 Other chronic pain: Secondary | ICD-10-CM | POA: Insufficient documentation

## 2013-06-11 DIAGNOSIS — M549 Dorsalgia, unspecified: Secondary | ICD-10-CM

## 2013-06-11 DIAGNOSIS — M79609 Pain in unspecified limb: Secondary | ICD-10-CM | POA: Insufficient documentation

## 2013-06-11 DIAGNOSIS — Z79899 Other long term (current) drug therapy: Secondary | ICD-10-CM | POA: Insufficient documentation

## 2013-06-11 DIAGNOSIS — I10 Essential (primary) hypertension: Secondary | ICD-10-CM | POA: Insufficient documentation

## 2013-06-11 DIAGNOSIS — Z8719 Personal history of other diseases of the digestive system: Secondary | ICD-10-CM | POA: Insufficient documentation

## 2013-06-11 DIAGNOSIS — Z8669 Personal history of other diseases of the nervous system and sense organs: Secondary | ICD-10-CM | POA: Insufficient documentation

## 2013-06-11 MED ORDER — HYDROMORPHONE HCL PF 1 MG/ML IJ SOLN
1.0000 mg | Freq: Once | INTRAMUSCULAR | Status: AC
  Administered 2013-06-11: 1 mg via INTRAMUSCULAR
  Filled 2013-06-11: qty 1

## 2013-06-11 MED ORDER — FAMOTIDINE 20 MG PO TABS: 20.0000 mg | ORAL_TABLET | Freq: Two times a day (BID) | ORAL | Status: DC

## 2013-06-11 NOTE — ED Notes (Signed)
Pt given 1 prescrip and walked to d/c window to wait on his ride home.

## 2013-06-11 NOTE — Progress Notes (Signed)
[facility] has seen patient. Pt stated that he used to have the [employer] Card but it expired when he got Medicaid. He no longer has Medicaid. Provided him with a [employer] app.

## 2013-06-11 NOTE — ED Notes (Signed)
Pt reports leg and back pain x 5 days. Pt has chronic back pain but has been worse lately and says pain is 10/10 and hurts worse down right leg. Pt ambulatory. Denies recent fall or injury. Pt reports back sx in 2006.

## 2013-06-11 NOTE — ED Provider Notes (Signed)
History    CSN: [id number] Arrival date & time 06/11/13  1117  First MD Initiated Contact with Patient 06/11/13 1124     No chief complaint on file.  (Consider location/radiation/quality/duration/timing/severity/associated sxs/prior Treatment) HPI Patient presents with concern of ongoing leg and back pain.  Patient states that he has had pain since he was 37 years old.  Pain became worse over the past 5 days without clear precipitant.  This exacerbation includes significant pain in both proximal thighs, low back.  No incontinence, weakness, falls. Pain is worse with motion. Patient continues to take narcotics, Valium.  He states that he is allergic to his narcotics, but continues to have some relief with Valium. He states that he is unwilling to see his primary care physician.   Past Medical History  Diagnosis Date  . Neuropathy   . GERD (gastroesophageal reflux disease)   . Chronic back pain   . Hypertension    Past Surgical History  Procedure Laterality Date  . Hand surgery    . Tonsillectomy    . Back surgery     Family History  Problem Relation Age of Onset  . Heart failure Mother   . Diabetes Father    History  Substance Use Topics  . Smoking status: Current Every Day Smoker -- 1.00 packs/day for 18 years    Types: Cigarettes  . Smokeless tobacco: Not on file  . Alcohol Use: No    Review of Systems  All other systems reviewed and are negative.    Allergies  Tramadol and Vicodin  Home Medications   Current Outpatient Rx  Name  Route  Sig  Dispense  Refill  . ibuprofen (ADVIL,MOTRIN) 200 MG tablet   Oral   Take 400 mg by mouth 2 (two) times daily as needed for pain.         . Omega-3 Fatty Acids (FISH OIL) 1200 MG CAPS   Oral   Take 1,200 mg by mouth daily.         . famotidine (PEPCID) 20 MG tablet   Oral   Take 1 tablet (20 mg total) by mouth 2 (two) times daily.   30 tablet   0    BP 143/96  Pulse 74  Temp(Src) 98.1 F (36.7 C)  (Oral)  Resp 18  SpO2 99% Physical Exam  Nursing note and vitals reviewed. Constitutional: He is oriented to person, place, and time. He appears well-developed. No distress.  HENT:  Head: Normocephalic and atraumatic.  Eyes: Conjunctivae and EOM are normal.  Cardiovascular: Normal rate and regular rhythm.   Pulmonary/Chest: Effort normal. No stridor. No respiratory distress.  Abdominal: He exhibits no distension.  Musculoskeletal: He exhibits no edema.  Patient is appropriate strength in flexion and extension of both hips, is ambulatory, without antalgic gait.  Neurological: He is alert and oriented to person, place, and time.  Skin: Skin is warm and dry.  Psychiatric: He has a normal mood and affect.    ED Course  Procedures (including critical care time) Labs Reviewed - No data to display No results found. 1. Back pain    chart review demonstrates the patient has been seen here 6 times in 6 months for similar concerns.  Review of the state to the database demonstrate the patient obtained 120 narcotic pills within the past month, as well as benzodiazepines.   MDM  Patient presents with acute on chronic low back pain.  On exam he is awake alert, and no neurologic distress.  Clinically he is stable.  After reviewing the patient's chronic pain with him, he is advised to followup with pain management clinic, orthopedics.  The patient stated allergy to Vicodin seems inconsistent with his consistently obtaining additional narcotics, per the state records.  With his consistent prescription use, additional narcotics were not provided here beyond intramuscular shot.  Absent distress, with stable vital signs, the patient was discharged in stable condition.  [name], MD 06/11/13 1217

## 2013-08-15 ENCOUNTER — Emergency Department (HOSPITAL_COMMUNITY)
Admission: EM | Admit: 2013-08-15 | Discharge: 2013-08-15 | Disposition: A | Discharge status: Home / Self Care | Payer: Medicaid Other | Attending: Emergency Medicine | Admitting: Emergency Medicine

## 2013-08-15 ENCOUNTER — Encounter (HOSPITAL_COMMUNITY): Payer: Self-pay | Admitting: Emergency Medicine

## 2013-08-15 DIAGNOSIS — M545 Low back pain, unspecified: Secondary | ICD-10-CM | POA: Insufficient documentation

## 2013-08-15 DIAGNOSIS — G608 Other hereditary and idiopathic neuropathies: Secondary | ICD-10-CM | POA: Insufficient documentation

## 2013-08-15 DIAGNOSIS — I10 Essential (primary) hypertension: Secondary | ICD-10-CM | POA: Insufficient documentation

## 2013-08-15 DIAGNOSIS — F17200 Nicotine dependence, unspecified, uncomplicated: Secondary | ICD-10-CM | POA: Insufficient documentation

## 2013-08-15 DIAGNOSIS — M79609 Pain in unspecified limb: Secondary | ICD-10-CM | POA: Insufficient documentation

## 2013-08-15 DIAGNOSIS — G8929 Other chronic pain: Secondary | ICD-10-CM | POA: Insufficient documentation

## 2013-08-15 DIAGNOSIS — K219 Gastro-esophageal reflux disease without esophagitis: Secondary | ICD-10-CM | POA: Insufficient documentation

## 2013-08-15 DIAGNOSIS — Z79899 Other long term (current) drug therapy: Secondary | ICD-10-CM | POA: Insufficient documentation

## 2013-08-15 MED ORDER — OXYCODONE-ACETAMINOPHEN 5-325 MG PO TABS: 1.0000 | ORAL_TABLET | Freq: Once | ORAL | Status: DC

## 2013-08-15 NOTE — ED Provider Notes (Signed)
CSN: [id number]     Arrival date & time 08/15/13  1423 History   First MD Initiated Contact with Patient 08/15/13 1441     Chief Complaint  Patient presents with  . Neck Pain  . Leg Pain  . Foot Pain   (Consider location/radiation/quality/duration/timing/severity/associated sxs/prior Treatment) HPI Comments: Patient here with exacerbation of chronic left sided neck and back pain - also reports that he has neuropathy as well.  States that his PCP has placed him on vicodin but this does not work for his pain (though later listed as an allergy).  States has previous back surgery - denies any change in this chronic pain except that he is out of pain medication and is really here to get a prescription for percocet which he reports helps his pain more than vicodin.    Patient is a 37 y.o. male presenting with neck pain, leg pain, and lower extremity pain. The history is provided by the patient. No language interpreter was used.  Neck Pain Pain location:  L side Quality:  Cramping and stabbing Pain radiates to:  L shoulder Pain severity:  Severe Pain is:  Same all the time Onset quality:  Unable to specify Timing:  Constant Progression:  Worsening Chronicity:  Chronic Relieved by:  Nothing Worsened by:  Nothing tried Ineffective treatments:  None tried Associated symptoms: leg pain   Associated symptoms: no bladder incontinence, no bowel incontinence, no headaches, no numbness, no paresis, no tingling and no weakness   Leg Pain Associated symptoms: neck pain   Foot Pain Associated symptoms include neck pain. Pertinent negatives include no headaches, numbness or weakness.    Past Medical History  Diagnosis Date  . Neuropathy   . GERD (gastroesophageal reflux disease)   . Chronic back pain   . Hypertension    Past Surgical History  Procedure Laterality Date  . Hand surgery    . Tonsillectomy    . Back surgery     Family History  Problem Relation Age of Onset  . Heart failure  Mother   . Diabetes Father    History  Substance Use Topics  . Smoking status: Current Every Day Smoker -- 1.00 packs/day for 18 years    Types: Cigarettes  . Smokeless tobacco: Not on file  . Alcohol Use: No    Review of Systems  HENT: Positive for neck pain.   Gastrointestinal: Negative for bowel incontinence.  Genitourinary: Negative for bladder incontinence.  Neurological: Negative for tingling, weakness, numbness and headaches.  All other systems reviewed and are negative.    Allergies  Tramadol and Vicodin  Home Medications   Current Outpatient Rx  Name  Route  Sig  Dispense  Refill  . famotidine (PEPCID) 20 MG tablet   Oral   Take 1 tablet (20 mg total) by mouth 2 (two) times daily.   30 tablet   0   . ibuprofen (ADVIL,MOTRIN) 200 MG tablet   Oral   Take 400 mg by mouth 2 (two) times daily as needed for pain.         . Omega-3 Fatty Acids (FISH OIL) 1200 MG CAPS   Oral   Take 1,200 mg by mouth daily.          BP 148/91  Pulse 81  Temp(Src) 98.2 F (36.8 C) (Oral)  Resp 18  SpO2 96% Physical Exam  Nursing note and vitals reviewed. Constitutional: He is oriented to person, place, and time. He appears well-developed and well-nourished. No distress.  HENT:  Head: Normocephalic and atraumatic.  Eyes: Conjunctivae are normal. No scleral icterus.  Neck: Muscular tenderness present. No spinous process tenderness present.    Musculoskeletal: Normal range of motion.       Lumbar back: He exhibits tenderness. He exhibits normal range of motion and no bony tenderness.       Back:  Neurological: He is alert and oriented to person, place, and time. He has normal reflexes. He exhibits normal muscle tone. Coordination normal.  Skin: Skin is warm and dry. No rash noted. No erythema. No pallor.  Psychiatric: He has a normal mood and affect. His behavior is normal. Judgment and thought content normal.    ED Course  Procedures (including critical care  time) Labs Review Labs Reviewed - No data to display Imaging Review No results found.  MDM   1. Chronic back pain    Patient with a history of chronic back pain presents requesting refills of his valium and percocet which he used to take for pain and muscle spasm.  I have informed the patient that he will need to get pain medication filled by PCP or pain management.  I have given him 1 percocet here for his pain.  I have referred him to pain management in [place] as he states that he has been seen up there for other things.  There is no change in his symptoms so I do not feel that any further work up is needed at this time.  There are no functional deficits and I doubt cauda equina, epidural abscess, no history of cancer.    Izola Price Marisue Humble, PA-C 08/15/13 [phone]

## 2013-08-15 NOTE — ED Notes (Signed)
Pt c/o leg, neck, foot and lower back pain that is chronic in nature worse x 3 days

## 2013-08-15 NOTE — ED Provider Notes (Signed)
Medical screening examination/treatment/procedure(s) were performed by non-physician practitioner and as supervising physician I was immediately available for consultation/collaboration.    Christopher J. Pollina, MD 08/15/13 1617 

## 2013-08-26 ENCOUNTER — Encounter (HOSPITAL_COMMUNITY): Payer: Self-pay

## 2013-08-26 ENCOUNTER — Emergency Department (HOSPITAL_COMMUNITY): Payer: Self-pay

## 2013-08-26 ENCOUNTER — Emergency Department (HOSPITAL_COMMUNITY)
Admission: EM | Admit: 2013-08-26 | Discharge: 2013-08-26 | Disposition: A | Discharge status: Home / Self Care | Payer: Self-pay | Attending: Emergency Medicine | Admitting: Emergency Medicine

## 2013-08-26 DIAGNOSIS — Z79899 Other long term (current) drug therapy: Secondary | ICD-10-CM | POA: Insufficient documentation

## 2013-08-26 DIAGNOSIS — I10 Essential (primary) hypertension: Secondary | ICD-10-CM | POA: Insufficient documentation

## 2013-08-26 DIAGNOSIS — R42 Dizziness and giddiness: Secondary | ICD-10-CM | POA: Insufficient documentation

## 2013-08-26 DIAGNOSIS — G8929 Other chronic pain: Secondary | ICD-10-CM | POA: Insufficient documentation

## 2013-08-26 DIAGNOSIS — R0789 Other chest pain: Secondary | ICD-10-CM | POA: Insufficient documentation

## 2013-08-26 DIAGNOSIS — R110 Nausea: Secondary | ICD-10-CM | POA: Insufficient documentation

## 2013-08-26 DIAGNOSIS — IMO0001 Reserved for inherently not codable concepts without codable children: Secondary | ICD-10-CM | POA: Insufficient documentation

## 2013-08-26 DIAGNOSIS — R209 Unspecified disturbances of skin sensation: Secondary | ICD-10-CM | POA: Insufficient documentation

## 2013-08-26 DIAGNOSIS — F17200 Nicotine dependence, unspecified, uncomplicated: Secondary | ICD-10-CM | POA: Insufficient documentation

## 2013-08-26 DIAGNOSIS — K219 Gastro-esophageal reflux disease without esophagitis: Secondary | ICD-10-CM | POA: Insufficient documentation

## 2013-08-26 DIAGNOSIS — R079 Chest pain, unspecified: Secondary | ICD-10-CM

## 2013-08-26 LAB — CBC WITH DIFFERENTIAL/PLATELET
Basophils Absolute: 0 10*3/uL (ref 0.0–0.1)
Eosinophils Relative: 2 % (ref 0–5)
HCT: 41.4 % (ref 39.0–52.0)
Hemoglobin: 15.1 g/dL (ref 13.0–17.0)
Lymphocytes Relative: 43 % (ref 12–46)
MCV: 87 fL (ref 78.0–100.0)
Monocytes Absolute: 0.7 10*3/uL (ref 0.1–1.0)
Monocytes Relative: 9 % (ref 3–12)
Neutro Abs: 3.6 10*3/uL (ref 1.7–7.7)
RDW: 12 % (ref 11.5–15.5)
WBC: 7.9 10*3/uL (ref 4.0–10.5)

## 2013-08-26 LAB — BASIC METABOLIC PANEL
BUN: 13 mg/dL (ref 6–23)
CO2: 29 mEq/L (ref 19–32)
Chloride: 102 mEq/L (ref 96–112)
Creatinine, Ser: 0.77 mg/dL (ref 0.50–1.35)
Potassium: 3.9 mEq/L (ref 3.5–5.1)

## 2013-08-26 MED ORDER — MECLIZINE HCL 12.5 MG PO TABS
25.0000 mg | ORAL_TABLET | Freq: Once | ORAL | Status: AC
  Administered 2013-08-26: 25 mg via ORAL
  Filled 2013-08-26: qty 2

## 2013-08-26 MED ORDER — ONDANSETRON 4 MG PO TBDP
4.0000 mg | ORAL_TABLET | Freq: Once | ORAL | Status: AC
  Administered 2013-08-26: 4 mg via ORAL
  Filled 2013-08-26: qty 1

## 2013-08-26 MED ORDER — TRAMADOL HCL 50 MG PO TABS
50.0000 mg | ORAL_TABLET | Freq: Once | ORAL | Status: AC
  Administered 2013-08-26: 50 mg via ORAL
  Filled 2013-08-26: qty 1

## 2013-08-26 MED ORDER — FAMOTIDINE 20 MG PO TABS: 20.0000 mg | ORAL_TABLET | Freq: Two times a day (BID) | ORAL | Status: DC

## 2013-08-26 MED ORDER — MECLIZINE HCL 50 MG PO TABS: 50.0000 mg | ORAL_TABLET | Freq: Three times a day (TID) | ORAL | Status: DC | PRN

## 2013-08-26 NOTE — ED Provider Notes (Signed)
CSN: [id number]     Arrival date & time 08/26/13  9147 History  This chart was scribed for Vida Roller, MD, by Yevette Edwards, ED Scribe. This patient was seen in room APA10/APA10 and the patient's care was started at 9:35 AM.  First MD Initiated Contact with Patient 08/26/13 [phone]     Chief Complaint  Patient presents with  . Nausea  . Dizziness  . Chest Pain    HPI HPI Comments: [name] is a 37 y.o. male, with a h/o GERD and HTN, who presents to the Emergency Department complaining of intermittent dizziness which began yesterday evening as he was in bed watching television. The pt reports the dizziness was constant yesterday evening, though today the dizziness is only intermittent. He has also experienced chest tightness, "heart burn," and nausea as  associated symptoms. He denies any tinnitus or emesis. The pt also states his right hand is numb and there is pain to his left leg and foot; he has a h/o neuropathy to his hands and feet.  He is a daily smoker.  Past Medical History  Diagnosis Date  . Neuropathy   . GERD (gastroesophageal reflux disease)   . Chronic back pain   . Hypertension    Past Surgical History  Procedure Laterality Date  . Hand surgery    . Tonsillectomy    . Back surgery     Family History  Problem Relation Age of Onset  . Heart failure Mother   . Diabetes Father    History  Substance Use Topics  . Smoking status: Current Every Day Smoker -- 1.00 packs/day for 18 years    Types: Cigarettes  . Smokeless tobacco: Not on file  . Alcohol Use: No    Review of Systems  Constitutional: Negative for fever and chills.  HENT: Negative for tinnitus.   Respiratory: Positive for chest tightness.   Gastrointestinal: Positive for nausea. Negative for vomiting.  Musculoskeletal: Positive for myalgias.  Neurological: Positive for dizziness.    Allergies  Tramadol and Vicodin  Home Medications   Current Outpatient Rx  Name  Route  Sig  Dispense   Refill  . famotidine (PEPCID) 20 MG tablet   Oral   Take 1 tablet (20 mg total) by mouth 2 (two) times daily.   30 tablet   0   . meclizine (ANTIVERT) 50 MG tablet   Oral   Take 1 tablet (50 mg total) by mouth 3 (three) times daily as needed.   30 tablet   0     Triage Vitals: BP 115/82  Pulse 59  Temp(Src) 97.3 F (36.3 C) (Oral)  Resp 16  SpO2 99%  Physical Exam  Nursing note and vitals reviewed. Constitutional: He is oriented to person, place, and time. He appears well-developed and well-nourished. No distress.  HENT:  Head: Normocephalic and atraumatic.  Eyes: EOM are normal.  Neck: Neck supple. No tracheal deviation present.  Cardiovascular: Normal rate, regular rhythm and normal heart sounds.   No murmur heard. Pulmonary/Chest: Effort normal and breath sounds normal. No respiratory distress. He has no wheezes.  Musculoskeletal: Normal range of motion.  Neurological: He is alert and oriented to person, place, and time.  Skin: Skin is warm and dry.  Psychiatric: He has a normal mood and affect. His behavior is normal.    ED Course  Procedures (including critical care time)  DIAGNOSTIC STUDIES: Oxygen Saturation is 99% on room air, normal by my interpretation.    COORDINATION  OF CARE:  10:30 AM- Discussed treatment plan with patient, and the patient agreed to the plan.   Labs Review Labs Reviewed  BASIC METABOLIC PANEL - Abnormal; Notable for the following:    Glucose, Bld 105 (*)    All other components within normal limits  CBC WITH DIFFERENTIAL - Abnormal; Notable for the following:    MCHC 36.5 (*)    All other components within normal limits  TROPONIN I  LIPASE, BLOOD   Imaging Review Dg Chest 2 View  08/26/2013   CLINICAL DATA:  Shortness of Breath  EXAM: CHEST  2 VIEW  COMPARISON:  June 12, 2010  FINDINGS: The lungs are clear. Heart size and pulmonary vascularity are normal. No adenopathy. No bone lesions.  IMPRESSION: No abnormality noted.    Electronically Signed   By: Bretta Bang   On: 08/26/2013 10:22     MDM   1. Vertigo   2. Chest pain    The patient has ongoing vertigo which is reducible on my exam. This resolves and fatigues very quickly. He has chronic chest pain which he states is epigastric and radiates up his chest, feels like this is acid reflux and does have mild epigastric tenderness on my exam. His EKG shows no ischemia, he can be referred to a family doctor for ongoing evaluation of chest pain. His troponin is normal, lab work otherwise normal, patient appears stable for discharge to followup with her family doctor.   ED ECG REPORT  I personally interpreted this EKG   Date: 08/26/2013   Rate: 60  Rhythm: normal sinus rhythm  QRS Axis: normal  Intervals: normal  ST/T Wave abnormalities: normal  Conduction Disutrbances:none  Narrative Interpretation:   Old EKG Reviewed: Compared with 01/26/2011, no significant changes are seen  Meds given in ED:  Medications  meclizine (ANTIVERT) tablet 25 mg (25 mg Oral Given 08/26/13 1002)  ondansetron (ZOFRAN-ODT) disintegrating tablet 4 mg (4 mg Oral Given 08/26/13 1002)    New Prescriptions   FAMOTIDINE (PEPCID) 20 MG TABLET    Take 1 tablet (20 mg total) by mouth 2 (two) times daily.   MECLIZINE (ANTIVERT) 50 MG TABLET    Take 1 tablet (50 mg total) by mouth 3 (three) times daily as needed.     I personally performed the services described in this documentation, which was scribed in my presence. The recorded information has been reviewed and is accurate.        Vida Roller, MD 08/26/13 1031

## 2013-08-26 NOTE — ED Notes (Signed)
Pt reports chest tightness, sob, dizziness, nausea for the past few weeks.  Pt reports " i think i'm a diabetic".

## 2013-09-26 ENCOUNTER — Emergency Department (HOSPITAL_COMMUNITY)
Admission: EM | Admit: 2013-09-26 | Discharge: 2013-09-26 | Disposition: A | Discharge status: Home / Self Care | Payer: Self-pay | Attending: Emergency Medicine | Admitting: Emergency Medicine

## 2013-09-26 ENCOUNTER — Encounter (HOSPITAL_COMMUNITY): Payer: Self-pay | Admitting: Emergency Medicine

## 2013-09-26 DIAGNOSIS — F17200 Nicotine dependence, unspecified, uncomplicated: Secondary | ICD-10-CM | POA: Insufficient documentation

## 2013-09-26 DIAGNOSIS — G8929 Other chronic pain: Secondary | ICD-10-CM | POA: Insufficient documentation

## 2013-09-26 DIAGNOSIS — R350 Frequency of micturition: Secondary | ICD-10-CM | POA: Insufficient documentation

## 2013-09-26 DIAGNOSIS — K219 Gastro-esophageal reflux disease without esophagitis: Secondary | ICD-10-CM | POA: Insufficient documentation

## 2013-09-26 DIAGNOSIS — Z9889 Other specified postprocedural states: Secondary | ICD-10-CM | POA: Insufficient documentation

## 2013-09-26 DIAGNOSIS — Z8669 Personal history of other diseases of the nervous system and sense organs: Secondary | ICD-10-CM | POA: Insufficient documentation

## 2013-09-26 DIAGNOSIS — I10 Essential (primary) hypertension: Secondary | ICD-10-CM | POA: Insufficient documentation

## 2013-09-26 DIAGNOSIS — M549 Dorsalgia, unspecified: Secondary | ICD-10-CM | POA: Insufficient documentation

## 2013-09-26 DIAGNOSIS — M25559 Pain in unspecified hip: Secondary | ICD-10-CM | POA: Insufficient documentation

## 2013-09-26 DIAGNOSIS — Z79899 Other long term (current) drug therapy: Secondary | ICD-10-CM | POA: Insufficient documentation

## 2013-09-26 MED ORDER — CYCLOBENZAPRINE HCL 10 MG PO TABS: 10.0000 mg | ORAL_TABLET | Freq: Two times a day (BID) | ORAL | Status: DC | PRN

## 2013-09-26 MED ORDER — OXYCODONE-ACETAMINOPHEN 5-325 MG PO TABS
1.0000 | ORAL_TABLET | Freq: Once | ORAL | Status: AC
  Administered 2013-09-26: 1 via ORAL
  Filled 2013-09-26: qty 1

## 2013-09-26 MED ORDER — OXYCODONE-ACETAMINOPHEN 5-325 MG PO TABS: 1.0000 | ORAL_TABLET | ORAL | Status: DC | PRN

## 2013-09-26 MED ORDER — CYCLOBENZAPRINE HCL 10 MG PO TABS
10.0000 mg | ORAL_TABLET | Freq: Once | ORAL | Status: AC
  Administered 2013-09-26: 10 mg via ORAL
  Filled 2013-09-26: qty 1

## 2013-09-26 NOTE — ED Notes (Signed)
Reports left hip pain that radiates down left and into foot. Reports hx of same but more severe x 3 days.

## 2013-09-26 NOTE — ED Provider Notes (Signed)
CSN: [id number]     Arrival date & time 09/26/13  1228 History   This chart was scribed for non-physician practitioner Elpidio Anis, PA-C, working with Enid Skeens, MD, by Yevette Edwards, ED Scribe. This patient was seen in room TR09C/TR09C and the patient's care was started at 1:08 PM.  First MD Initiated Contact with Patient 09/26/13 1246     Chief Complaint  Patient presents with  . Hip Pain   The history is provided by the patient. No language interpreter was used.   HPI Comments: [name] is a 37 y.o. male, with a h/o neuropathy and chronic back pain, who presents to the Emergency Department complaining of left-sided hip pain which radiates down his left leg and into his foot. The pt reports a h/o similar symptoms, but he states the hip pain has increased in intensity in the past three days, and he rates the pain 9/10. The pt denies any recent falls. He has also experienced swelling to his right foot. The pt also reports increased frequency. He denies any abdominal pain or dysuria. He has a h/o back surgeries.   The pt's last appointment with Dr. August Luz three months ago. The pt states that Vicodin causes emesis, but that Dr. August Luz will not change the prescriptions. He states that he fills his Vicodin prescription because he lawyer informed him to since he is claiming disability. The pt has an appointment in two weeks, on October 12, 2013, at a pain clinic.  Past Medical History  Diagnosis Date  . Neuropathy   . GERD (gastroesophageal reflux disease)   . Chronic back pain   . Hypertension    Past Surgical History  Procedure Laterality Date  . Hand surgery    . Tonsillectomy    . Back surgery     Family History  Problem Relation Age of Onset  . Heart failure Mother   . Diabetes Father    History  Substance Use Topics  . Smoking status: Current Every Day Smoker -- 1.00 packs/day for 18 years    Types: Cigarettes  . Smokeless tobacco: Not on file  . Alcohol Use:  No    Review of Systems  Constitutional: Negative for fever.  Gastrointestinal: Negative for abdominal pain.  Genitourinary: Positive for frequency. Negative for dysuria.  Musculoskeletal: Positive for arthralgias, back pain and myalgias.    Allergies  Tramadol and Vicodin  Home Medications   Current Outpatient Rx  Name  Route  Sig  Dispense  Refill  . famotidine (PEPCID) 20 MG tablet   Oral   Take 1 tablet (20 mg total) by mouth 2 (two) times daily.   30 tablet   0   . meclizine (ANTIVERT) 50 MG tablet   Oral   Take 1 tablet (50 mg total) by mouth 3 (three) times daily as needed.   30 tablet   0    Triage Vitals: BP 140/85  Pulse 83  Temp(Src) 98.2 F (36.8 C) (Oral)  Resp 18  SpO2 98%  Physical Exam  Nursing note and vitals reviewed. Constitutional: He is oriented to person, place, and time. He appears well-developed and well-nourished. No distress.  HENT:  Head: Normocephalic and atraumatic.  Eyes: EOM are normal.  Neck: Neck supple. No tracheal deviation present.  Cardiovascular: Normal rate.   Pulmonary/Chest: Effort normal. No respiratory distress.  Abdominal: There is no tenderness.  No abdominal pain.   Musculoskeletal: Normal range of motion.  Equal strength in the lower extremities.  Full ROM Ambulatory with steady gait. Distal pulses intact.   Neurological: He is alert and oriented to person, place, and time.  Skin: Skin is warm and dry.  Psychiatric: He has a normal mood and affect. His behavior is normal.    ED Course  Procedures (including critical care time)  DIAGNOSTIC STUDIES: Oxygen Saturation is 98% on room air, normal by my interpretation.    COORDINATION OF CARE:  1:16 PM- Discussed treatment plan with patient which includes pain medication and muscle relaxers, and the patient agreed to the plan.   Labs Review Labs Reviewed - No data to display. Imaging Review No results found.  EKG Interpretation   None       MDM   No diagnosis found. 1. Chronic pain  [place] Controlled Sub. Database reviewed. Patient has been getting regular pain Rx's from Dr. Mikeal Hawthorne on or around the 5th of the month. Last refill was 08/30/13 - due for pain medication. He reports he is in between medical providers with an appointment on the 18th with [facility]. Will Rx pain medication and muscle relaxer, limited quantities.   I personally performed the services described in this documentation, which was scribed in my presence. The recorded information has been reviewed and is accurate.     Arnoldo Hooker, PA-C 09/26/13 1330

## 2013-09-26 NOTE — ED Provider Notes (Signed)
Medical screening examination/treatment/procedure(s) were performed by non-physician practitioner and as supervising physician I was immediately available for consultation/collaboration.  EKG Interpretation   None         Jvion Turgeon M Aaliya Maultsby, MD 09/26/13 1732 

## 2013-12-04 ENCOUNTER — Encounter (HOSPITAL_COMMUNITY): Payer: Self-pay | Admitting: Emergency Medicine

## 2013-12-04 ENCOUNTER — Emergency Department (HOSPITAL_COMMUNITY)
Admission: EM | Admit: 2013-12-04 | Discharge: 2013-12-04 | Disposition: A | Discharge status: Home / Self Care | Payer: Self-pay | Attending: Emergency Medicine | Admitting: Emergency Medicine

## 2013-12-04 DIAGNOSIS — G8929 Other chronic pain: Secondary | ICD-10-CM | POA: Insufficient documentation

## 2013-12-04 DIAGNOSIS — F17200 Nicotine dependence, unspecified, uncomplicated: Secondary | ICD-10-CM | POA: Insufficient documentation

## 2013-12-04 DIAGNOSIS — R209 Unspecified disturbances of skin sensation: Secondary | ICD-10-CM | POA: Insufficient documentation

## 2013-12-04 DIAGNOSIS — M5432 Sciatica, left side: Secondary | ICD-10-CM

## 2013-12-04 DIAGNOSIS — R52 Pain, unspecified: Secondary | ICD-10-CM | POA: Insufficient documentation

## 2013-12-04 DIAGNOSIS — Z8719 Personal history of other diseases of the digestive system: Secondary | ICD-10-CM | POA: Insufficient documentation

## 2013-12-04 DIAGNOSIS — IMO0001 Reserved for inherently not codable concepts without codable children: Secondary | ICD-10-CM | POA: Insufficient documentation

## 2013-12-04 DIAGNOSIS — M5430 Sciatica, unspecified side: Secondary | ICD-10-CM | POA: Insufficient documentation

## 2013-12-04 DIAGNOSIS — Z9889 Other specified postprocedural states: Secondary | ICD-10-CM | POA: Insufficient documentation

## 2013-12-04 DIAGNOSIS — I10 Essential (primary) hypertension: Secondary | ICD-10-CM | POA: Insufficient documentation

## 2013-12-04 MED ORDER — DIAZEPAM 5 MG PO TABS
10.0000 mg | ORAL_TABLET | Freq: Once | ORAL | Status: AC
  Administered 2013-12-04: 10 mg via ORAL
  Filled 2013-12-04: qty 2

## 2013-12-04 MED ORDER — OXYCODONE-ACETAMINOPHEN 5-325 MG PO TABS: 1.0000 | ORAL_TABLET | ORAL | Status: DC | PRN

## 2013-12-04 MED ORDER — OXYCODONE-ACETAMINOPHEN 5-325 MG PO TABS
2.0000 | ORAL_TABLET | Freq: Once | ORAL | Status: AC
  Administered 2013-12-04: 2 via ORAL
  Filled 2013-12-04: qty 2

## 2013-12-04 MED ORDER — DIAZEPAM 5 MG PO TABS: 5.0000 mg | ORAL_TABLET | Freq: Four times a day (QID) | ORAL | Status: DC | PRN

## 2013-12-04 NOTE — ED Notes (Signed)
Pt reports hx of back pain. Now having cramping sensation down left leg and into his foot. Ambulatory at triage.

## 2013-12-04 NOTE — Discharge Instructions (Signed)
1. Medications: valium, percocet, usual home medications 2. Treatment: rest, drink plenty of fluids, gentle stretching as discussed, alternate ice and heat 3. Follow Up: Please followup with your primary doctor for discussion of your diagnoses and further evaluation after today's visit; if you do not have a primary care doctor use the resource guide provided to find one;  Chronic Back Pain  When back pain lasts longer than 3 months, it is called chronic back pain.People with chronic back pain often go through certain periods that are more intense (flare-ups).  CAUSES Chronic back pain can be caused by wear and tear (degeneration) on different structures in your back. These structures include:  The bones of your spine (vertebrae) and the joints surrounding your spinal cord and nerve roots (facets).  The strong, fibrous tissues that connect your vertebrae (ligaments). Degeneration of these structures may result in pressure on your nerves. This can lead to constant pain. HOME CARE INSTRUCTIONS  Avoid bending, heavy lifting, prolonged sitting, and activities which make the problem worse.  Take brief periods of rest throughout the day to reduce your pain. Lying down or standing usually is better than sitting while you are resting.  Take over-the-counter or prescription medicines only as directed by your caregiver. SEEK IMMEDIATE MEDICAL CARE IF:   You have weakness or numbness in one of your legs or feet.  You have trouble controlling your bladder or bowels.  You have nausea, vomiting, abdominal pain, shortness of breath, or fainting. Document Released: 12/19/2004 Document Revised: 02/03/2012 Document Reviewed: 10/26/2011 [facility] Patient Information 2014 [place], [place].

## 2013-12-04 NOTE — ED Provider Notes (Signed)
CSN: [id number]     Arrival date & time 12/04/13  1707 History  This chart was scribed for non-physician practitioner, Dierdre Forth, PA-C working with Candyce Churn, MD by Greggory Stallion, ED scribe. This patient was seen in room TR08C/TR08C and the patient's care was started at 6:16 PM.   Chief Complaint  Patient presents with  . Back Pain  . Leg Pain   The history is provided by the patient and medical records. No language interpreter was used.   HPI Comments: [name] is a 38 y.o. male with history of chronic back pain and neuropathy who presents to the Emergency Department complaining of continuing sharp, left, lower back pain that radiates down his left leg and into his foot causing a feeling of cramping pain that started about one week ago. Denies recent fall or injury to his back. He pt reports a h/o similar symptoms.  Pt states he has been taking Vicodin but it makes him sick and his doctor will not switch it to percocet. The last time he took Vicodin was about 3 days ago with little relief. Sitting for too long worsens the pain. He states when he walks, he sometimes gets numbness in his leg. Denies open wounds. He denies any abdominal pain or dysuria. He has a h/o back surgeries by Dr. Otelia Sergeant.   The pt's last appointment with Dr. August Luz was several months ago. The pt states that Vicodin causes emesis and hand itching, but that Dr. August Luz will not change the prescriptions. He states that he fills his Vicodin prescription because he lawyer informed him to since he is claiming disability. The pt reports he has an appointment next week at [facility] pain clinic.    Past Medical History  Diagnosis Date  . Neuropathy   . GERD (gastroesophageal reflux disease)   . Chronic back pain   . Hypertension    Past Surgical History  Procedure Laterality Date  . Hand surgery    . Tonsillectomy    . Back surgery     Family History  Problem Relation Age of Onset  . Heart failure  Mother   . Diabetes Father    History  Substance Use Topics  . Smoking status: Current Every Day Smoker -- 1.00 packs/day for 18 years    Types: Cigarettes  . Smokeless tobacco: Not on file  . Alcohol Use: No    Review of Systems  Constitutional: Negative for fever, diaphoresis, appetite change, fatigue and unexpected weight change.  HENT: Negative for mouth sores.   Eyes: Negative for visual disturbance.  Respiratory: Negative for cough, chest tightness, shortness of breath and wheezing.   Cardiovascular: Negative for chest pain.  Gastrointestinal: Negative for nausea, vomiting, abdominal pain, diarrhea and constipation.  Endocrine: Negative for polydipsia, polyphagia and polyuria.  Genitourinary: Negative for dysuria, urgency, frequency and hematuria.  Musculoskeletal: Positive for back pain and myalgias. Negative for neck stiffness.  Skin: Negative for rash and wound.  Allergic/Immunologic: Negative for immunocompromised state.  Neurological: Positive for numbness. Negative for syncope, light-headedness and headaches.  Hematological: Does not bruise/bleed easily.  Psychiatric/Behavioral: Negative for sleep disturbance. The patient is not nervous/anxious.     Allergies  Tramadol and Vicodin  Home Medications   Current Outpatient Rx  Name  Route  Sig  Dispense  Refill  . ibuprofen (ADVIL,MOTRIN) 200 MG tablet   Oral   Take 200 mg by mouth every 6 (six) hours as needed for headache or mild pain.         Marland Kitchen  diazepam (VALIUM) 5 MG tablet   Oral   Take 1 tablet (5 mg total) by mouth every 6 (six) hours as needed for anxiety (spasms).   10 tablet   0   . oxyCODONE-acetaminophen (PERCOCET/ROXICET) 5-325 MG per tablet   Oral   Take 1-2 tablets by mouth every 4 (four) hours as needed for severe pain.   15 tablet   0    BP 134/87  Pulse 90  Temp(Src) 98.6 F (37 C) (Oral)  Resp 18  Wt 350 lb 4.8 oz (158.895 kg)  SpO2 97%  Physical Exam  Nursing note and vitals  reviewed. Constitutional: He is oriented to person, place, and time. He appears well-developed and well-nourished. No distress.  HENT:  Head: Normocephalic and atraumatic.  Mouth/Throat: Oropharynx is clear and moist. No oropharyngeal exudate.  Eyes: Conjunctivae are normal. Pupils are equal, round, and reactive to light.  Neck: Normal range of motion. Neck supple.  Full ROM without pain  Cardiovascular: Normal rate, regular rhythm, normal heart sounds and intact distal pulses.   No murmur heard. Pulmonary/Chest: Effort normal and breath sounds normal. No respiratory distress. He has no wheezes.  Abdominal: Soft. He exhibits no distension. There is no tenderness.  Musculoskeletal: He exhibits tenderness. He exhibits no edema.  Full range of motion of the T-spine and L-spine No tenderness to palpation of the spinous processes of the T-spine or L-spine Mild tenderness to palpation of the paraspinous muscles of the L-spine Palpation of left buttocks creates reproducible cramping sensation and pain in left leg.   Lymphadenopathy:    He has no cervical adenopathy.  Neurological: He is alert and oriented to person, place, and time. He has normal reflexes. He exhibits normal muscle tone. Coordination normal.  Speech is clear and goal oriented, follows commands Normal strength in upper and lower extremities bilaterally including dorsiflexion and plantar flexion, strong and equal grip strength Sensation normal to light and sharp touch Moves extremities without ataxia, coordination intact Normal gait Normal balance   Skin: Skin is warm and dry. No rash noted. He is not diaphoretic. No erythema.  Psychiatric: He has a normal mood and affect. His behavior is normal.    ED Course  Procedures (including critical care time)  DIAGNOSTIC STUDIES: Oxygen Saturation is 97% on RA, normal by my interpretation.    COORDINATION OF CARE: 6:23 PM-Discussed treatment plan which includes Valium and a  short course of percocet with pt at bedside and pt agreed to plan.   Labs Review Labs Reviewed - No data to display Imaging Review No results found.  EKG Interpretation   None       MDM   1. Sciatica, left      [name] presents with acute exacerbation of chronic back pain. Normal neurological exam, no evidence of urinary incontinence or retention, pain is consistently reproducible. There is no evidence of AAA or concern for dissection at this time.   Patient can walk but states is painful.  No loss of bowel or bladder control.  No concern for cauda equina.  No fever, night sweats, weight loss, h/o cancer, IVDU.  Pain treated here in the department with adequate improvement. Patient reports sensations of cramping but on further elicitation of this it is only a sensation that he does not actually experience leg cramps. RICE protocol and pain medicine indicated and discussed with patient. I have also discussed reasons to return immediately to the ER.  Patient expresses understanding and agrees with plan.  It has been determined that no acute conditions requiring further emergency intervention are present at this time. The patient/guardian have been advised of the diagnosis and plan. We have discussed signs and symptoms that warrant return to the ED, such as changes or worsening in symptoms.   Vital signs are stable at discharge.   BP 134/87  Pulse 90  Temp(Src) 98.6 F (37 C) (Oral)  Resp 18  Wt 350 lb 4.8 oz (158.895 kg)  SpO2 97%  Patient/guardian has voiced understanding and agreed to follow-up with the PCP or specialist.    I personally performed the services described in this documentation, which was scribed in my presence. The recorded information has been reviewed and is accurate.     Dierdre Forth, PA-C 12/04/13 1910  Dierdre Forth, PA-C 12/04/13 1913

## 2013-12-05 NOTE — ED Provider Notes (Signed)
Medical screening examination/treatment/procedure(s) were performed by non-physician practitioner and as supervising physician I was immediately available for consultation/collaboration.  EKG Interpretation   None         Candyce ChurnJohn David Manpreet Strey, MD 12/05/13 [phone]

## 2013-12-14 ENCOUNTER — Encounter (HOSPITAL_COMMUNITY): Payer: Self-pay | Admitting: Emergency Medicine

## 2013-12-14 ENCOUNTER — Emergency Department (HOSPITAL_COMMUNITY)
Admission: EM | Admit: 2013-12-14 | Discharge: 2013-12-14 | Disposition: A | Discharge status: Home / Self Care | Payer: Self-pay | Attending: Emergency Medicine | Admitting: Emergency Medicine

## 2013-12-14 DIAGNOSIS — M79671 Pain in right foot: Secondary | ICD-10-CM

## 2013-12-14 DIAGNOSIS — I10 Essential (primary) hypertension: Secondary | ICD-10-CM | POA: Insufficient documentation

## 2013-12-14 DIAGNOSIS — G8929 Other chronic pain: Secondary | ICD-10-CM | POA: Insufficient documentation

## 2013-12-14 DIAGNOSIS — M79609 Pain in unspecified limb: Secondary | ICD-10-CM | POA: Insufficient documentation

## 2013-12-14 DIAGNOSIS — Z8719 Personal history of other diseases of the digestive system: Secondary | ICD-10-CM | POA: Insufficient documentation

## 2013-12-14 DIAGNOSIS — F17200 Nicotine dependence, unspecified, uncomplicated: Secondary | ICD-10-CM | POA: Insufficient documentation

## 2013-12-14 DIAGNOSIS — M79672 Pain in left foot: Secondary | ICD-10-CM

## 2013-12-14 MED ORDER — OXYCODONE-ACETAMINOPHEN 5-325 MG PO TABS: 1.0000 | ORAL_TABLET | ORAL | Status: DC | PRN

## 2013-12-14 MED ORDER — OXYCODONE-ACETAMINOPHEN 5-325 MG PO TABS
1.0000 | ORAL_TABLET | Freq: Once | ORAL | Status: AC
  Administered 2013-12-14: 1 via ORAL
  Filled 2013-12-14: qty 1

## 2013-12-14 MED ORDER — IBUPROFEN 800 MG PO TABS: 800.0000 mg | ORAL_TABLET | Freq: Three times a day (TID) | ORAL | Status: DC

## 2013-12-14 NOTE — ED Provider Notes (Signed)
CSN: [id number]     Arrival date & time 12/14/13  1354 History  This chart was scribed for non-physician practitioner Jaynie Crumble, PA-C working with Shelda Jakes, MD by Valera Castle, ED scribe. This patient was seen in room TR11C/TR11C and the patient's care was started at 4:45 PM.   Chief Complaint  Patient presents with  . Foot Pain  . Leg Pain    The history is provided by the patient. No language interpreter was used.   HPI Comments: [name] is a 38 y.o. male with h/o neuropathy, who presents to the Emergency Department complaining of bilateral foot and leg pain, onset 1 week ago since his last appointment with pain clinic. He states reports being able to ambulate, but with pain to his feet. He states that sitting down, keeping pressure off his feet helps relieve his pain.He has not been able to f/u with pain clinic. He denies h/o DM. He denies any other symptoms. He reports not being able to read and write and so has trouble finding a primary care doctor or applying for assitance.   PCP - No PCP Per Patient  Past Medical History  Diagnosis Date  . Neuropathy   . GERD (gastroesophageal reflux disease)   . Chronic back pain   . Hypertension    Past Surgical History  Procedure Laterality Date  . Hand surgery    . Tonsillectomy    . Back surgery     Family History  Problem Relation Age of Onset  . Heart failure Mother   . Diabetes Father    History  Substance Use Topics  . Smoking status: Current Every Day Smoker -- 1.00 packs/day for 18 years    Types: Cigarettes  . Smokeless tobacco: Not on file  . Alcohol Use: No    Review of Systems  Constitutional: Negative for fever.  Musculoskeletal: Positive for arthralgias (bilateral feet, LE). Negative for back pain and gait problem.       + for bilateral LE pain.     Allergies  Tramadol and Vicodin  Home Medications   Current Outpatient Rx  Name  Route  Sig  Dispense  Refill  . ibuprofen  (ADVIL,MOTRIN) 200 MG tablet   Oral   Take 600 mg by mouth once.          . diazepam (VALIUM) 5 MG tablet   Oral   Take 5 mg by mouth every 6 (six) hours as needed for anxiety (spasms).         Marland Kitchen oxyCODONE-acetaminophen (PERCOCET/ROXICET) 5-325 MG per tablet   Oral   Take 1-2 tablets by mouth every 4 (four) hours as needed for severe pain.          BP 151/84  Pulse 73  Temp(Src) 97.9 F (36.6 C) (Oral)  Resp 18  SpO2 98%  Physical Exam  Nursing note and vitals reviewed. Constitutional: He is oriented to person, place, and time. He appears well-developed and well-nourished. No distress.  HENT:  Head: Normocephalic and atraumatic.  Eyes: EOM are normal.  Neck: Neck supple.  Cardiovascular: Normal rate.   Pulmonary/Chest: Effort normal. No respiratory distress.  Musculoskeletal: Normal range of motion.  Normal appearing bilateral feet and LE. Multiple calluses over bilateral feet, ttp. Feet are warm, pink. Normal dorsal pedal pulses bilaterally  Neurological: He is alert and oriented to person, place, and time.  Skin: Skin is warm and dry.  Psychiatric: He has a normal mood and affect. His behavior is  normal.    ED Course  Procedures (including critical care time)  DIAGNOSTIC STUDIES: Oxygen Saturation is 98% on room air, normal by my interpretation.    COORDINATION OF CARE: 4:48 PM-Discussed treatment plan which includes pain medication with pt at bedside and pt agreed to plan. Discussed with pt limitations of treating chronic pain in ED. Will give pt referral to PCP.    Labs Review Labs Reviewed - No data to display Imaging Review No results found.  EKG Interpretation   None       MDM   1. Foot pain, bilateral     Patient's with bilateral chronic foot pain. He states he was told he has neuropathy however he denies diabetes. He states he saw a neurologist 2 years ago who has done some testing and told him that's what he had. Patient states that he was  also referred to pain clinic and had an appointment but then medicated dropped down and he now is unable to pay for his visits. He does not have a primary care Dr. Patient states he is alert and unable to read or write. He requests assistance with applying for [facility].   I spoke with the case manager who came by and saw patient, and she will make an [facility] for [facility] for [facility].   Will give prescription for 10 tab of percocet. Follow up   Filed Vitals:   12/14/13 1404  BP: 151/84  Pulse: 73  Temp: 97.9 F (36.6 C)  Resp: 18    I personally performed the services described in this documentation, which was scribed in my presence. The recorded information has been reviewed and is accurate.   Lottie Musselatyana A Dickson Kostelnik, PA-C 12/14/13 2343  Lottie Musselatyana A Novis League, PA-C 12/14/13 2344

## 2013-12-14 NOTE — Discharge Instructions (Signed)
Ibuprofen and percocet for pain. Follow up with a primary care doctor.    Peripheral Neuropathy Peripheral neuropathy is a type of nerve damage. It affects nerves that carry signals between the spinal cord and other parts of the body. These are called peripheral nerves. With peripheral neuropathy, one nerve or a group of nerves may be damaged.  CAUSES  Many things can damage peripheral nerves. For some people with peripheral neuropathy, the cause is unknown. Some causes include:  Diabetes. This is the most common cause of peripheral neuropathy.  Injury to a nerve.  Pressure or stress on a nerve that lasts a long time.  Too little vitamin B. Alcoholism can lead to this.  Infections.  Autoimmune diseases, such as multiple sclerosis and systemic lupus erythematosus.  Inherited nerve diseases.  Some medicines, such as cancer drugs.  Toxic substances, such as lead and mercury.  Too little blood flowing to the legs.  Kidney disease.  Thyroid disease. SIGNS AND SYMPTOMS  Different people have different symptoms. The symptoms you have will depend on which of your nerves is damaged. Common symptoms include:  Loss of feeling (numbness) in the feet and hands.  Tingling in the feet and hands.  Pain that burns.  Very sensitive skin.  Weakness.  Not being able to move a part of the body (paralysis).  Muscle twitching.  Clumsiness or poor coordination.  Loss of balance.  Not being able to control your bladder.  Feeling dizzy.  Sexual problems. DIAGNOSIS  Peripheral neuropathy is a symptom, not a disease. Finding the cause of peripheral neuropathy can be hard. To figure that out, your health care provider will take a medical history and do a physical exam. A neurological exam will also be done. This involves checking things affected by your brain, spinal cord, and nerves (nervous system). For example, your health care provider will check your reflexes, how you move, and  what you can feel.  Other types of tests may also be ordered, such as:  Blood tests.  A test of the fluid in your spinal cord.  Imaging tests, such as CT scans or an MRI.  Electromyography (EMG). This test checks the nerves that control muscles.  Nerve conduction velocity tests. These tests check how fast messages pass through your nerves.  Nerve biopsy. A small piece of nerve is removed. It is then checked under a microscope. TREATMENT   Medicine is often used to treat peripheral neuropathy. Medicines may include:  Pain-relieving medicines. Prescription or over-the-counter medicine may be suggested.  Antiseizure medicine. This may be used for pain.  Antidepressants. These also may help ease pain from neuropathy.  Lidocaine. This is a numbing medicine. You might wear a patch or be given a shot.  Mexiletine. This medicine is typically used to help control irregular heart rhythms.  Surgery. Surgery may be needed to relieve pressure on a nerve or to destroy a nerve that is causing pain.  Physical therapy to help movement.  Assistive devices to help movement. HOME CARE INSTRUCTIONS   Only take over-the-counter or prescription medicines as directed by your health care provider. Follow the instructions carefully for any given medicines. Do not take any other medicines without first getting approval from your health care provider.  If you have diabetes, work closely with your health care provider to keep your blood sugar under control.  If you have numbness in your feet:  Check every day for signs of injury or infection. Watch for redness, warmth, and swelling.  Wear padded socks and comfortable shoes. These help protect your feet.  Do not do things that put pressure on your damaged nerve.  Do not smoke. Smoking keeps blood from getting to damaged nerves.  Avoid or limit alcohol. Too much alcohol can cause a lack of B vitamins. These vitamins are needed for healthy  nerves.  Develop a good support system. Coping with peripheral neuropathy can be stressful. Talk to a mental health specialist or join a support group if you are struggling.  Follow up with your health care provider as directed. SEEK MEDICAL CARE IF:   You have new signs or symptoms of peripheral neuropathy.  You are struggling emotionally from dealing with peripheral neuropathy.  You have a fever. SEEK IMMEDIATE MEDICAL CARE IF:   You have an injury or infection that is not healing.  You feel very dizzy or begin vomiting.  You have chest pain.  You have trouble breathing. Document Released: 11/01/2002 Document Revised: 07/24/2011 Document Reviewed: 07/19/2013 [facility] Patient Information 2014 [employer] [place].

## 2013-12-14 NOTE — ED Notes (Signed)
Pt reports needing social worker to help him with applying for orange card, he is unable to read or write.

## 2013-12-14 NOTE — Progress Notes (Signed)
ED CM called to see patient by  regarding PCP issues patient is uninsured. [facility]. Hx of neuropathy. Pt states, he does not have insurance therefore, has been unable to establish care with a PCP.Pt states he is unemployed. Discussed the [facility] and [employer] Card. Patient receptive and  agrees with plan. asked permission to forward his information to [facility] Felicia Liaison to [facility]. verified phone number as [phone].  No further ED CM needs identified

## 2013-12-14 NOTE — ED Notes (Signed)
Pt was here recently for same. Having bilateral foot and leg pain, has been diagnosed with neuropathy but has been unable to followup with pain clinic. Pt ambulatory at triage.

## 2013-12-19 NOTE — ED Provider Notes (Signed)
Medical screening examination/treatment/procedure(s) were performed by non-physician practitioner and as supervising physician I was immediately available for consultation/collaboration.  EKG Interpretation   None        Shelda JakesScott W. Dvaughn Fickle, MD 12/19/13 Rickey [id number]

## 2013-12-20 ENCOUNTER — Emergency Department (HOSPITAL_COMMUNITY)
Admission: EM | Admit: 2013-12-20 | Discharge: 2013-12-20 | Disposition: A | Discharge status: Home / Self Care | Payer: Self-pay | Attending: Emergency Medicine | Admitting: Emergency Medicine

## 2013-12-20 ENCOUNTER — Encounter (HOSPITAL_COMMUNITY): Payer: Self-pay | Admitting: Emergency Medicine

## 2013-12-20 DIAGNOSIS — M79606 Pain in leg, unspecified: Secondary | ICD-10-CM

## 2013-12-20 DIAGNOSIS — G8929 Other chronic pain: Secondary | ICD-10-CM | POA: Insufficient documentation

## 2013-12-20 DIAGNOSIS — Z8669 Personal history of other diseases of the nervous system and sense organs: Secondary | ICD-10-CM | POA: Insufficient documentation

## 2013-12-20 DIAGNOSIS — I10 Essential (primary) hypertension: Secondary | ICD-10-CM | POA: Insufficient documentation

## 2013-12-20 DIAGNOSIS — M79609 Pain in unspecified limb: Secondary | ICD-10-CM | POA: Insufficient documentation

## 2013-12-20 DIAGNOSIS — F17200 Nicotine dependence, unspecified, uncomplicated: Secondary | ICD-10-CM | POA: Insufficient documentation

## 2013-12-20 DIAGNOSIS — Z8719 Personal history of other diseases of the digestive system: Secondary | ICD-10-CM | POA: Insufficient documentation

## 2013-12-20 MED ORDER — DIAZEPAM 5 MG PO TABS
5.0000 mg | ORAL_TABLET | Freq: Two times a day (BID) | ORAL | Status: DC
Start: 2013-12-20 — End: 2013-12-30

## 2013-12-20 MED ORDER — OXYCODONE-ACETAMINOPHEN 5-325 MG PO TABS
2.0000 | ORAL_TABLET | Freq: Once | ORAL | Status: AC
  Administered 2013-12-20: 2 via ORAL
  Filled 2013-12-20: qty 2

## 2013-12-20 MED ORDER — OXYCODONE-ACETAMINOPHEN 5-325 MG PO TABS: 2.0000 | ORAL_TABLET | ORAL | Status: DC | PRN

## 2013-12-20 NOTE — Progress Notes (Signed)
[facility] from [facility]-C [facility] [place] concerning PCP f/u appt with [facility].   Explained that the schedulers were notified about contacting patient to set up f/u with PCP. CM will [facility] again by email regarding f/u appointment.

## 2013-12-20 NOTE — ED Notes (Signed)
Pt. Here for pain in lower legs and feet. Seen previously for same. Reports "I cant find a PCP that will accept me and that I can afford. I'm going to court about my Medicaid. And last time I was here the social worker said they would call me about getting an orange card and I no one has been in touch".

## 2013-12-20 NOTE — Discharge Instructions (Signed)
Take Percocet as needed for pain. Take Valium as needed. Follow up with Primary Care Physician from the resource guide.    Emergency Department Resource Guide 1) Find a Doctor and Pay Out of Pocket Although you won't have to find out who is covered by your insurance plan, it is a good idea to ask around and get recommendations. You will then need to call the office and see if the doctor you have chosen will accept you as a new patient and what types of options they offer for patients who are self-pay. Some doctors offer discounts or will set up payment plans for their patients who do not have insurance, but you will need to ask so you aren't surprised when you get to your appointment.  2) Contact Your Local Health Department Not all health departments have doctors that can see patients for sick visits, but many do, so it is worth a call to see if yours does. If you don't know where your local health department is, you can check in your phone book. The CDC also has a tool to help you locate your state's health department, and many state websites also have listings of all of their local health departments.  3) Find a Walk-in Clinic If your illness is not likely to be very severe or complicated, you may want to try a walk in clinic. These are popping up all over the country in pharmacies, drugstores, and shopping centers. They're usually staffed by nurse practitioners or physician assistants that have been trained to treat common illnesses and complaints. They're usually fairly quick and inexpensive. However, if you have serious medical issues or chronic medical problems, these are probably not your best option.  No Primary Care Doctor: - Call Health Connect at  [phone] - they can help you locate a primary care doctor that  accepts your insurance, provides certain services, etc. - Physician Referral Service- [phone]  Chronic Pain Problems: Organization         Address  Phone   Notes  [facility]  [phone] Patients need to be referred by their primary care doctor.   Medication Assistance: Organization         Address  Phone   Notes  [facility] [address] [place], [place] [address] [phone]) [phone] --Must be a resident of [facility] [place] -- Must have NO insurance coverage whatsoever (no Medicaid/ Medicare, etc.) -- The pt. MUST have a primary care doctor that directs their care regularly and follows them in the community   MedAssist  [phone]) [phone]   [employer]  [phone]    Agencies that provide inexpensive medical care: Organization         Address  Phone   Notes  [facility]  [phone]) [phone]   [facility]    [phone]) [phone]   [facility] [address] [place], [place] [address] [phone]) [phone]   [facility] [address], [place] [phone]   Planned Parenthood    [phone]   [facility]    [phone](336) [phone]   [facility]  [address], [place] Phone:  [phone]) [phone], Fax:  [phone] Hours of Operation:  9 am - 6 pm, M-F.  Also accepts Medicaid/Medicare and self-pay.  [facility]  [address],  Phone: [phone](336) [phone], Fax: [phone]. Hours of Operation:  8:30 am - 5:30 pm, M-F.  Also accepts Medicaid and self-pay.  [facility] [place] [address], [place] Phone: [phone]   [place], [place], [place] [phone], Ext. [phone] Mondays & Thursdays: 7-9 AM.  First 15 patients are seen on a first come, first serve basis.    [place] Providers:  Organization         Address  Phone   Notes  [facility] [address], [place] [phone] Also accepts self-pay patients.  [facility] [address], [place]  [phone]   [place], [address], [place] [phone]   [facility] [address] [place] [phone]   Lucianne Lei [address], [place]   [phone] Only accepts [place] Access [place] patients after they have their name applied to their card.   Self-Pay (no insurance) in [facility]:  Organization         Address  Phone   Notes  Sickle Cell Patients, [facility] [place] [phone]   [facility] [place] [phone]   [facility] [place]  [place], [place], [place] [phone]   [facility]/Dr. Osei-Bonsu  [address], [place] or [address], [place] [phone] Phone number for both [place] and [place] locations is the same.  Urgent Medical and [facility] [address], [place] [phone]   [facility] [address] [place] or [address] [phone]   [facility] [address] [place] [phone], phone; [phone], fax Sees patients 1st and 3rd Saturday of every month.  Must not qualify for public or private insurance (i.e. Medicaid, Medicare, [place] Health Choice, Veterans' Benefits)  Household income should be no more than 200% of the poverty level The clinic cannot treat you if you are pregnant or think you are pregnant  Sexually transmitted diseases are not treated at the clinic.    Dental Care: Organization         Address  Phone  Notes  [facility] Department of [facility] [place] [phone] Accepts children up to age 37 who are enrolled in [place] or [place]; pregnant women with a Medicaid card; and children who have applied for  Medicaid or [place] Health Choice, but were declined, whose parents can pay a reduced fee at time of service.  [facility] Department of [facility]  [address], [place] [phone] Accepts children up to age 26 who are enrolled in [place] or [place]; pregnant women with a Medicaid card; and children who have applied for Medicaid or [place] Health Choice, but were declined, whose parents can pay a reduced fee at time of service.  [facility]  [place] [phone] Patients are seen by appointment only. Walk-ins are not accepted. [place] will see patients 43 years of age and older. Monday - Tuesday (8am-5pm) Most Wednesdays (8:30-5pm) $30 per visit, cash only  [place] [facility] [employer] PROGRAM  [address], [employer] [phone])  [phone] Patients are seen by appointment only. Walk-ins are not accepted. [place] will see patients 34 years of age and older. One Wednesday Evening (Monthly: Volunteer Based).  $30 per visit, cash only  [place]  [phone] for adults; Children under age 57, call Graduate Pediatric Dentistry at [phone]. Children aged 16-14, please call [phone] to request a pediatric application.  Dental services are provided in all areas of dental care including fillings, crowns and bridges, complete and partial dentures, implants, gum treatment, root canals, and extractions. Preventive care is also provided. Treatment is provided to both adults and children. Patients are selected via a lottery and there is often a waiting list.   [facility] [address] [place]  [phone] [email or web address]   [facility] [address] [place], [place] [phone], Ext. [phone] Second and Fourth Thursday of each month, opens at 6:30 AM; Clinic ends at 9 AM.  Patients are seen on a first-come first-served basis, and a limited number  are seen during each clinic.   [facility]  [address] Hillard Danker [place], [place] [phone]   Eligibility Requirements You must have lived in [place], [place], or [place] counties for at least the last three months.   You cannot be eligible for state or federal sponsored [employer], including [employer], [place], or [employer].   You generally cannot be eligible for healthcare insurance through your employer.    How to apply: Eligibility screenings are held every Tuesday and Wednesday afternoon from 1:00 pm until 4:00 pm. You do not need an appointment for the interview!  [facility] [address] [place], [place]   [place]  [place] Department  [place]  [phone]    Behavioral Health Resources in the Community: Intensive Outpatient Programs Organization         Address  Phone  Notes  [facility]. [address], [place], [place] [phone]   [facility] [address] [place], [place]   ADS: Alcohol & Drug Svcs [address], [place], [place]   [place] [address]  [place], [place] or [phone]   Substance Abuse Resources Organization         Address  Phone  Notes  Alcohol and Drug Services  [phone]   [place]  [phone]   [facility] [place]   Chinita Pester  [phone]   Residential & Outpatient Substance Abuse Program  [phone]   Psychological Services Organization         Address  Phone  Notes  [facility] [place]  [place]  [phone]   [place] [address] [place] or [phone]    Mobile Crisis Teams Organization         Address  Phone  Notes  Therapeutic  Alternatives, Mobile Crisis Care Unit  [phone]   Assertive Psychotherapeutic Services  [address] [place], [place]   [place] Levels [address] [place] [phone]    Self-Help/Support Groups Organization         Address  Phone             Notes  [place]. of [place] - variety of support groups  [phone]  [phone] Call for more information  Narcotics Anonymous (NA), [facility] [address], [employer] [place]  2 meetings at this location   Residential Facilities manager         Address  Phone  Notes  [facility] [place],    [place]  [phone]   [facility]  [address], [place] [address], [place], [place]   [place], [place] [phone] Admissions: 8am-3pm M-F  Incentives Substance [place] [address]    [place], [place] [phone]   [facility] [address] [place], [place], [place]   [facility] [address]  [place], [place]   Insight Programs - Intensive Outpatient [address], [place], [place]   [facility] ([place].,  [place], [place] [phone] or [phone]   [facility] (RTS) [address], [place], [place] Accepts Medicaid  [facility] [place] [address]  [place] [phone])[phone] Substance Abuse/Addiction Treatment   [facility] Organization         Address  Phone  Notes  [facility]  [phone]   Domenic Schwab, PhD [address] Arlis Porta [place], [place]   [phone] or [phone]   [place], [place] [phone]   [facility] [address] [place], [place] [phone] Insurance/Medicaid/sponsorship through [facility] and Families  [address] [place]                                    [place], [place] [phone] [place] [address] [place], [place] [phone]    Dr. Adele Schilder  [phone]   [facility] Dept. 1) [address], [place] 2) [place] 3)  [place] [address], [place] [phone]  [phone]   [place] [phone] or [phone] (After Hours)

## 2013-12-20 NOTE — ED Notes (Signed)
Pt has had continual pain to bilateral legs, states dx with neuropathy. Denied at pain clinic. Here due to pain.

## 2013-12-20 NOTE — ED Provider Notes (Signed)
CSN: [id number]     Arrival date & time 12/20/13  1722 History  This chart was scribed for non-physician practitioner, Emilia Beck, PA-C working with Gavin Pound. Oletta Lamas, MD by Greggory Stallion, ED scribe. This patient was seen in room TR06C/TR06C and the patient's care was started at 7:12 PM.   Chief Complaint  Patient presents with  . Leg Pain   The history is provided by the patient. No language interpreter was used.   HPI Comments: [name] is a 38 y.o. male with history of neuropathy who presents to the Emergency Department complaining of continuing bilateral leg and foot pain that started several months ago. Denies new injury. Pt has been evaluated here in the past for the same and was supposed to get set up with an orange card but never did. He is requesting Valium.   Past Medical History  Diagnosis Date  . Neuropathy   . GERD (gastroesophageal reflux disease)   . Chronic back pain   . Hypertension    Past Surgical History  Procedure Laterality Date  . Hand surgery    . Tonsillectomy    . Back surgery     Family History  Problem Relation Age of Onset  . Heart failure Mother   . Diabetes Father    History  Substance Use Topics  . Smoking status: Current Every Day Smoker -- 1.00 packs/day for 18 years    Types: Cigarettes  . Smokeless tobacco: Not on file  . Alcohol Use: No    Review of Systems  Musculoskeletal: Positive for arthralgias and myalgias.  All other systems reviewed and are negative.   Allergies  Tramadol and Vicodin  Home Medications   Current Outpatient Rx  Name  Route  Sig  Dispense  Refill  . diazepam (VALIUM) 5 MG tablet   Oral   Take 5 mg by mouth every 6 (six) hours as needed for anxiety (spasms).          BP 145/88  Pulse 78  Temp(Src) 98.1 F (36.7 C) (Oral)  Resp 20  Wt 355 lb 7 oz (161.225 kg)  SpO2 99%  Physical Exam  Nursing note and vitals reviewed. Constitutional: He is oriented to person, place, and time. He  appears well-developed and well-nourished. No distress.  HENT:  Head: Normocephalic and atraumatic.  Eyes: EOM are normal.  Neck: Neck supple. No tracheal deviation present.  Cardiovascular: Normal rate.   Pulmonary/Chest: Effort normal. No respiratory distress.  Musculoskeletal: Normal range of motion.  No obvious deformity of lower extremity joints. Mild generalized tenderness to palpation.   Neurological: He is alert and oriented to person, place, and time.  Skin: Skin is warm and dry.  Psychiatric: He has a normal mood and affect. His behavior is normal.    ED Course  Procedures (including critical care time)  DIAGNOSTIC STUDIES: Oxygen Saturation is 99% on RA, normal by my interpretation.    COORDINATION OF CARE: 7:14 PM-Discussed treatment plan which includes pain medication in the ED and resources with pt at bedside and pt agreed to plan.   Labs Review Labs Reviewed - No data to display Imaging Review No results found.  EKG Interpretation   None       MDM   1. Chronic leg pain     8:08 PM Patient has chronic leg pain. Patient is being referred to a PCP by case management. Patient will have pain medication. Vitals stable and patient afebrile. No imaging indicated at this time.  I personally performed the services described in this documentation, which was scribed in my presence. The recorded information has been reviewed and is accurate.   Emilia BeckKaitlyn Derry Kassel, PA-C 12/21/13 0006

## 2013-12-20 NOTE — ED Notes (Signed)
Pt states he has "neuropathy" bilat lower legs but it is officially undx by a physician.  States "my father had it so I must have it too, heard it is hereditary"   Has been experiencing difficulty finding a health care provider due to lack of insurance and funds to pay for one.

## 2013-12-21 NOTE — ED Provider Notes (Signed)
Medical screening examination/treatment/procedure(s) were performed by non-physician practitioner and as supervising physician I was immediately available for consultation/collaboration.  EKG Interpretation   None         Jontavius Rabalais Y. Natalyah Cummiskey, MD 12/21/13 2104 

## 2013-12-21 NOTE — Progress Notes (Signed)
Attempted to contact patient by phone with phone number provided, to give him appt date and time. As per [facility]  [facility] liaison Appt is scheduled for Feb 20 th at 4:30p.

## 2013-12-30 ENCOUNTER — Encounter (HOSPITAL_COMMUNITY): Payer: Self-pay | Admitting: Emergency Medicine

## 2013-12-30 ENCOUNTER — Emergency Department (HOSPITAL_COMMUNITY)
Admission: EM | Admit: 2013-12-30 | Discharge: 2013-12-30 | Disposition: A | Discharge status: Home / Self Care | Payer: Self-pay | Attending: Emergency Medicine | Admitting: Emergency Medicine

## 2013-12-30 DIAGNOSIS — Z8719 Personal history of other diseases of the digestive system: Secondary | ICD-10-CM | POA: Insufficient documentation

## 2013-12-30 DIAGNOSIS — R269 Unspecified abnormalities of gait and mobility: Secondary | ICD-10-CM | POA: Insufficient documentation

## 2013-12-30 DIAGNOSIS — G629 Polyneuropathy, unspecified: Secondary | ICD-10-CM

## 2013-12-30 DIAGNOSIS — G609 Hereditary and idiopathic neuropathy, unspecified: Secondary | ICD-10-CM | POA: Insufficient documentation

## 2013-12-30 DIAGNOSIS — IMO0001 Reserved for inherently not codable concepts without codable children: Secondary | ICD-10-CM | POA: Insufficient documentation

## 2013-12-30 DIAGNOSIS — F17200 Nicotine dependence, unspecified, uncomplicated: Secondary | ICD-10-CM | POA: Insufficient documentation

## 2013-12-30 DIAGNOSIS — I10 Essential (primary) hypertension: Secondary | ICD-10-CM | POA: Insufficient documentation

## 2013-12-30 DIAGNOSIS — G8929 Other chronic pain: Secondary | ICD-10-CM | POA: Insufficient documentation

## 2013-12-30 DIAGNOSIS — M549 Dorsalgia, unspecified: Secondary | ICD-10-CM | POA: Insufficient documentation

## 2013-12-30 MED ORDER — METHOCARBAMOL 500 MG PO TABS: 500.0000 mg | ORAL_TABLET | Freq: Two times a day (BID) | ORAL | Status: DC

## 2013-12-30 MED ORDER — METHOCARBAMOL 500 MG PO TABS
750.0000 mg | ORAL_TABLET | Freq: Once | ORAL | Status: AC
  Administered 2013-12-30: 750 mg via ORAL
  Filled 2013-12-30: qty 2

## 2013-12-30 NOTE — Discharge Instructions (Signed)
1. Medications: robaxin, usual home medications 2. Treatment: rest, drink plenty of fluids, take medications at home 3. Follow Up: Please followup with your primary doctor for discussion of your diagnoses and further evaluation after today's visit;

## 2013-12-30 NOTE — ED Provider Notes (Signed)
CSN: [id number]     Arrival date & time 12/30/13  1522 History  This chart was scribed for non-physician practitioner Dierdre Forth, PA-C, working with [name], MD by Dorothey Baseman, ED Scribe. This patient was seen in room TR04C/TR04C and the patient's care was started at 4:25 PM.    Chief Complaint  Patient presents with  . Leg Pain  . Foot Pain   The history is provided by the patient and medical records. No language interpreter was used.   HPI Comments: [name] is a 38 y.o. Male with a history of chronic back pain and neuropathy who presents to the Emergency Department complaining of a constant pain to the bilateral legs and feet that he states is chronic in nature. He denies any new potential injury or trauma to the back. He reports an associated itching/burning sensation to the bilateral lower legs onset about 2 days ago. He reports taking ibuprofen, Percocet, and Valium at home without significant relief of his symptoms. Patient states that he has been told his symptoms are due to neuropathy, but that he has not been able to follow up with a doctor due to lack of health insurance. Patient states that he is currently working with a Child psychotherapist to help him acquire health insurance, but that he has not heard back from them in about 2 weeks. He states that he also has not been able to follow up at the pain management/wellness clinic for financial reasons. He denies numbness, bowel or bladder incontinence. Patient also has a history of HTN.  Past Medical History  Diagnosis Date  . Neuropathy   . GERD (gastroesophageal reflux disease)   . Chronic back pain   . Hypertension    Past Surgical History  Procedure Laterality Date  . Hand surgery    . Tonsillectomy    . Back surgery     Family History  Problem Relation Age of Onset  . Heart failure Mother   . Diabetes Father    History  Substance Use Topics  . Smoking status: Current Every Day Smoker -- 1.00 packs/day  for 18 years    Types: Cigarettes  . Smokeless tobacco: Not on file  . Alcohol Use: No    Review of Systems  Constitutional: Negative for fever and fatigue.  Respiratory: Negative for chest tightness and shortness of breath.   Cardiovascular: Negative for chest pain.  Gastrointestinal: Negative for nausea, vomiting, abdominal pain and diarrhea.  Genitourinary: Negative for dysuria, urgency, frequency and hematuria.  Musculoskeletal: Positive for back pain, gait problem ( 2/2 pain) and myalgias. Negative for arthralgias, joint swelling, neck pain and neck stiffness.  Skin: Negative for rash.  Neurological: Negative for weakness, light-headedness, numbness and headaches.  All other systems reviewed and are negative.    Allergies  Tramadol and Vicodin  Home Medications   Current Outpatient Rx  Name  Route  Sig  Dispense  Refill  . amphetamine-dextroamphetamine (ADDERALL) 20 MG tablet   Oral   Take 20 mg by mouth 2 (two) times daily.         . diazepam (VALIUM) 5 MG tablet   Oral   Take 5 mg by mouth every 6 (six) hours as needed for anxiety or muscle spasms.         Marland Kitchen gabapentin (NEURONTIN) 300 MG capsule   Oral   Take 600 mg by mouth 2 (two) times daily.         Marland Kitchen ibuprofen (ADVIL,MOTRIN) 200 MG tablet  Oral   Take 600 mg by mouth every 8 (eight) hours as needed for mild pain.         Marland Kitchen. oxyCODONE-acetaminophen (PERCOCET/ROXICET) 5-325 MG per tablet   Oral   Take 1 tablet by mouth every 6 (six) hours as needed for severe pain.         . methocarbamol (ROBAXIN) 500 MG tablet   Oral   Take 1 tablet (500 mg total) by mouth 2 (two) times daily.   20 tablet   0    Triage Vitals: BP 147/89  Pulse 93  Temp(Src) 97.6 F (36.4 C) (Oral)  Resp 18  SpO2 99%  Physical Exam  Nursing note and vitals reviewed. Constitutional: He is oriented to person, place, and time. He appears well-developed and well-nourished. No distress.  Awake, alert, nontoxic appearance   HENT:  Head: Normocephalic and atraumatic.  Mouth/Throat: Oropharynx is clear and moist. No oropharyngeal exudate.  Eyes: Conjunctivae are normal. No scleral icterus.  Neck: Normal range of motion and full passive range of motion without pain. Neck supple. No spinous process tenderness and no muscular tenderness present. No rigidity. Normal range of motion present.  Full ROM without pain  Cardiovascular: Normal rate, regular rhythm, normal heart sounds and intact distal pulses.   No murmur heard. Pulmonary/Chest: Effort normal and breath sounds normal. No respiratory distress. He has no wheezes.  Abdominal: Soft. Bowel sounds are normal. He exhibits no distension and no mass. There is no tenderness. There is no rebound and no guarding.  Musculoskeletal: Normal range of motion. He exhibits no edema.  Full range of motion of the T-spine and L-spine No tenderness to palpation of the spinous processes of the T-spine or L-spine No tenderness to palpation of the paraspinous muscles of the L-spine  Lymphadenopathy:    He has no cervical adenopathy.  Neurological: He is alert and oriented to person, place, and time. He has normal reflexes. He displays normal reflexes. He exhibits normal muscle tone. Coordination normal.  Reflex Scores:      Patellar reflexes are 2+ on the right side and 2+ on the left side.      Achilles reflexes are 2+ on the right side and 2+ on the left side. Speech is clear and goal oriented, follows commands Normal strength in upper and lower extremities bilaterally including dorsiflexion and plantar flexion, strong and equal grip strength Sensation normal to light and sharp touch Moves extremities without ataxia, coordination intact Normal gait Normal balance   Skin: Skin is warm and dry. No rash noted. He is not diaphoretic. No erythema.  No erythema, induration or rash noted to bilateral lower legs or other areas of skin  Psychiatric: He has a normal mood and affect.  His behavior is normal.    ED Course  Procedures (including critical care time)  DIAGNOSTIC STUDIES: Oxygen Saturation is 99% on room air, normal by my interpretation.    COORDINATION OF CARE: 4:30 PM- Will consult with the social worker. Will order a muscle relaxant to manage symptoms. Discussed that the patient will not be receiving any more narcotic pain medication at this time and that the patient needs to be followed by an out-patient clinic to manage his chronic pain. Discussed treatment plan with patient at bedside and patient verbalized agreement.     Labs Review Labs Reviewed - No data to display Imaging Review No results found.  EKG Interpretation   None       MDM   1. Chronic  back pain   2. Peripheral neuropathy     Dante Gang presents with chronic back pain and sciatic symptoms.  Normal neurological exam, no evidence of urinary incontinence or retention, pain is consistently reproducible.  Patient walks without difficulty but states is painful.  No loss of bowel or bladder control.  No concern for cauda equina. No focal neurologic deficits.  No fever, night sweats, weight loss, h/o cancer, IVDU.  Pt has been seen here in the department several times for the same.  Reports he cannot see a PCP b/c social work was going to make an appointment for him. He reports he still has vicodin at home.  RICE protocol indicated and discussed with patient. I have also discussed reasons to return immediately to the ER.  Patient expresses understanding and agrees with plan.  Will consult Social work.   5:12 PM Discussed with social work; pt with appointment on 01/14/13 at 4pm.  Pt given resources and discussed that the ED will not Rx narcotics from the ED anymore.  Pt to f/u with PCP.    Dahlia Client Aliece Honold, PA-C 12/30/13 1721

## 2013-12-30 NOTE — ED Notes (Signed)
Spoke to [name] with case management sts pt has appointment [facility] 01/14/14 at 4:30. Pt provided incorrect phone number and facility was unable to reach him. Appointment shared with patient and given in written format to patient.

## 2013-12-30 NOTE — ED Notes (Signed)
Pt reports chronic pain to legs and feet, has been told its neuropathy but pt has not followed up with dr and has been waiting on social worker to call him about getting his orange card. No distress noted, ambulatory at triage.

## 2013-12-30 NOTE — ED Notes (Signed)
Pt given Robaxin 750mg  to take with cup of water.  Pt appeared to take to med but kept his fist clenched.  When I ask him to open his hand the med was still there.  I instructed him to take the med in which he did at that time.

## 2013-12-31 NOTE — ED Provider Notes (Signed)
Medical screening examination/treatment/procedure(s) were performed by non-physician practitioner and as supervising physician I was immediately available for consultation/collaboration.  Joanell Cressler, MD 12/31/13 0011 

## 2014-01-08 ENCOUNTER — Emergency Department (HOSPITAL_COMMUNITY)
Admission: EM | Admit: 2014-01-08 | Discharge: 2014-01-08 | Disposition: A | Discharge status: Home / Self Care | Payer: Self-pay | Attending: Emergency Medicine | Admitting: Emergency Medicine

## 2014-01-08 ENCOUNTER — Encounter (HOSPITAL_COMMUNITY): Payer: Self-pay | Admitting: Emergency Medicine

## 2014-01-08 DIAGNOSIS — Z79899 Other long term (current) drug therapy: Secondary | ICD-10-CM | POA: Insufficient documentation

## 2014-01-08 DIAGNOSIS — Z8719 Personal history of other diseases of the digestive system: Secondary | ICD-10-CM | POA: Insufficient documentation

## 2014-01-08 DIAGNOSIS — I10 Essential (primary) hypertension: Secondary | ICD-10-CM | POA: Insufficient documentation

## 2014-01-08 DIAGNOSIS — G629 Polyneuropathy, unspecified: Secondary | ICD-10-CM

## 2014-01-08 DIAGNOSIS — F17200 Nicotine dependence, unspecified, uncomplicated: Secondary | ICD-10-CM | POA: Insufficient documentation

## 2014-01-08 DIAGNOSIS — G5790 Unspecified mononeuropathy of unspecified lower limb: Secondary | ICD-10-CM | POA: Insufficient documentation

## 2014-01-08 DIAGNOSIS — M549 Dorsalgia, unspecified: Secondary | ICD-10-CM | POA: Insufficient documentation

## 2014-01-08 DIAGNOSIS — G8929 Other chronic pain: Secondary | ICD-10-CM | POA: Insufficient documentation

## 2014-01-08 MED ORDER — GABAPENTIN 300 MG PO CAPS
300.0000 mg | ORAL_CAPSULE | Freq: Once | ORAL | Status: AC
  Administered 2014-01-08: 300 mg via ORAL
  Filled 2014-01-08 (×2): qty 1

## 2014-01-08 MED ORDER — OXYCODONE-ACETAMINOPHEN 5-325 MG PO TABS
1.0000 | ORAL_TABLET | Freq: Once | ORAL | Status: AC
  Administered 2014-01-08: 1 via ORAL
  Filled 2014-01-08: qty 1

## 2014-01-08 MED ORDER — OXYCODONE-ACETAMINOPHEN 5-325 MG PO TABS: 1.0000 | ORAL_TABLET | Freq: Four times a day (QID) | ORAL | Status: DC | PRN

## 2014-01-08 MED ORDER — GABAPENTIN 600 MG PO TABS
300.0000 mg | ORAL_TABLET | Freq: Once | ORAL | Status: DC
  Filled 2014-01-08: qty 0.5

## 2014-01-08 MED ORDER — GABAPENTIN 300 MG PO CAPS: 300.0000 mg | ORAL_CAPSULE | Freq: Three times a day (TID) | ORAL | Status: DC

## 2014-01-08 MED ORDER — DIAZEPAM 5 MG PO TABS: 5.0000 mg | ORAL_TABLET | Freq: Four times a day (QID) | ORAL | Status: DC | PRN

## 2014-01-08 NOTE — ED Notes (Signed)
Pt in c/o bilateral lower leg and foot pain, states he has been told he has neuropathy and was referred to follow up but doesn't have insurance so he hasn't been able to, states the medication we prescribed him isn't helping

## 2014-01-08 NOTE — ED Provider Notes (Signed)
CSN: [id number]     Arrival date & time 01/08/14  1650 History   First MD Initiated Contact with Patient 01/08/14 1700     This chart was scribed for non-physician practitioner, Francee PiccoloJennifer Ermie Glendenning PA-C working with Gwyneth SproutWhitney Plunkett, MD by Arlan OrganAshley Leger, ED Scribe. This patient was seen in room TR08C/TR08C and the patient's care was started at 5:11 PM.   Chief Complaint  Patient presents with  . Leg Pain   Patient is a 38 y.o. male presenting with leg pain. The history is provided by the patient. No language interpreter was used.  Leg Pain Location:  Leg and foot Injury: no   Foot location:  L foot and R foot Pain details:    Quality:  Burning and sharp   Radiates to:  L leg and R leg   Severity:  Severe   Onset quality:  Gradual   Timing:  Constant   Progression:  Worsening Dislocation: no   Foreign body present:  No foreign bodies Prior injury to area:  No Relieved by:  Nothing Worsened by:  Nothing tried Associated symptoms: back pain   Associated symptoms: no fever     HPI Comments: [name] is a 38 y.o. male with a PMHx of neuropathy, GERD, chronic back pain, and HTN who presents to the Emergency Department complaining of gradually worsening, constant, severe bilateral lower extremity and foot pain described as "sharp" and "burning" which he says is chronic in nature. He states the pain originates in his feet bilaterally, and radiates up his lower extremities. Denies any recent falls or injury. He has tried OTC and prescribed medications such as ibuprofen, Valium, and Percocet with no noticeably improvement. Pt has also tried Robaxin with no improvement, but says he has recently run out of his prescription. He denies any fever or chills at this time. Pt was recently seen on 2/5 for the same complaint and was treated with a muscle relaxant. Pt was also advised to follow up with a provider for further evaluation. However, he states he is currently awaiting his Orange card  for coverage, and should be receiving his package in the mail in about 6 days.  Past Medical History  Diagnosis Date  . Neuropathy   . GERD (gastroesophageal reflux disease)   . Chronic back pain   . Hypertension    Past Surgical History  Procedure Laterality Date  . Hand surgery    . Tonsillectomy    . Back surgery     Family History  Problem Relation Age of Onset  . Heart failure Mother   . Diabetes Father    History  Substance Use Topics  . Smoking status: Current Every Day Smoker -- 1.00 packs/day for 18 years    Types: Cigarettes  . Smokeless tobacco: Not on file  . Alcohol Use: No    Review of Systems  Constitutional: Negative for fever and chills.  Musculoskeletal: Positive for arthralgias, back pain and myalgias (Bilateral lower extremity pain).       Bilateral foot and lower extremity pain  All other systems reviewed and are negative.      Allergies  Tramadol and Vicodin  Home Medications   Current Outpatient Rx  Name  Route  Sig  Dispense  Refill  . amphetamine-dextroamphetamine (ADDERALL) 20 MG tablet   Oral   Take 20 mg by mouth 2 (two) times daily.         Marland Kitchen. gabapentin (NEURONTIN) 300 MG capsule   Oral  Take 600 mg by mouth 2 (two) times daily.         Marland Kitchen ibuprofen (ADVIL,MOTRIN) 200 MG tablet   Oral   Take 600 mg by mouth every 8 (eight) hours as needed for mild pain.         . methocarbamol (ROBAXIN) 500 MG tablet   Oral   Take 1 tablet (500 mg total) by mouth 2 (two) times daily.   20 tablet   0   . diazepam (VALIUM) 5 MG tablet   Oral   Take 1 tablet (5 mg total) by mouth every 6 (six) hours as needed for muscle spasms.   10 tablet   0   . gabapentin (NEURONTIN) 300 MG capsule   Oral   Take 1 capsule (300 mg total) by mouth 3 (three) times daily.   90 capsule   0   . oxyCODONE-acetaminophen (PERCOCET/ROXICET) 5-325 MG per tablet   Oral   Take 1 tablet by mouth every 6 (six) hours as needed for severe pain.          Marland Kitchen oxyCODONE-acetaminophen (PERCOCET/ROXICET) 5-325 MG per tablet   Oral   Take 1-2 tablets by mouth every 6 (six) hours as needed for severe pain.   10 tablet   0    Triage Vitals: BP 144/86  Pulse 85  Temp(Src) 97.6 F (36.4 C) (Oral)  Resp 20  Ht 5\' 11"  (1.803 m)  Wt 351 lb (159.213 kg)  BMI 48.98 kg/m2  SpO2 100%  Physical Exam  Nursing note and vitals reviewed. Constitutional: He is oriented to person, place, and time. He appears well-developed and well-nourished. No distress.  HENT:  Head: Normocephalic and atraumatic.  Right Ear: External ear normal.  Left Ear: External ear normal.  Nose: Nose normal.  Mouth/Throat: Oropharynx is clear and moist. No oropharyngeal exudate.  Eyes: Conjunctivae and EOM are normal. Pupils are equal, round, and reactive to light.  Neck: Normal range of motion. Neck supple.  Cardiovascular: Normal rate, regular rhythm, normal heart sounds and intact distal pulses.   Pulmonary/Chest: Effort normal and breath sounds normal. No respiratory distress.  Abdominal: Soft. There is no tenderness.  Musculoskeletal: Normal range of motion.  Neurological: He is alert and oriented to person, place, and time. He has normal strength. No cranial nerve deficit or sensory deficit. Gait normal. GCS eye subscore is 4. GCS verbal subscore is 5. GCS motor subscore is 6.  Sharp and dull densation intact in lower extremities  Skin: Skin is warm and dry. He is not diaphoretic.  Psychiatric: He has a normal mood and affect. His behavior is normal.    ED Course  Procedures (including critical care time) Medications  gabapentin (NEURONTIN) capsule 300 mg (300 mg Oral Given 01/08/14 1736)  oxyCODONE-acetaminophen (PERCOCET/ROXICET) 5-325 MG per tablet 1 tablet (1 tablet Oral Given 01/08/14 1740)     DIAGNOSTIC STUDIES: Oxygen Saturation is 100% on RA, Normal by my interpretation.    COORDINATION OF CARE: 5:30 PM- Will give Neurontin, Percocet, and Valium.  Discussed treatment plan with pt at bedside and pt agreed to plan.     Labs Review Labs Reviewed - No data to display Imaging Review No results found.  EKG Interpretation   None       MDM   Final diagnoses:  Neuropathy  Chronic back pain    Filed Vitals:   01/08/14 1654  BP: 144/86  Pulse: 85  Temp: 97.6 F (36.4 C)  Resp: 20  Afebrile, NAD, non-toxic appearing, AAOx4. Patient presenting with continued neuropathy. Neurovascularly intact. Case manager contacted regarding Gabapentin prescription coverage until patient's orange card is in affect. Patient has chronic leg pain. Patient is being referred to a PCP by case management. Patient will have pain medication. Vitals stable and patient afebrile. No imaging indicated at this time. Patient is stable at time of discharge.      I personally performed the services described in this documentation, which was scribed in my presence. The recorded information has been reviewed and is accurate.     Lise Auer Yonas Bunda, PA-C 01/08/14 2350

## 2014-01-08 NOTE — Discharge Instructions (Signed)
Please follow up with your primary care physician in 1-2 days. If you do not have one please call the [facility] number listed above. Please take all medications as prescribed. Case Manager will be in contact with you in the morning regarding your prescription assistance for the Neurontin. Please read all discharge instructions and return precautions.   Neuropathic Pain We often think that pain has a physical cause. If we get rid of the cause, the pain should go away. Nerves themselves can also cause pain. It is called neuropathic pain, which means nerve abnormality. It may be difficult for the patients who have it and for the treating caregivers. Pain is usually described as acute (short-lived) or chronic (long-lasting). Acute pain is related to the physical sensations caused by an injury. It can last from a few seconds to many weeks, but it usually goes away when normal healing occurs. Chronic pain lasts beyond the typical healing time. With neuropathic pain, the nerve fibers themselves may be damaged or injured. They then send incorrect signals to other pain centers. The pain you feel is real, but the cause is not easy to find.  CAUSES  Chronic pain can result from diseases, such as diabetes and shingles (an infection related to chickenpox), or from trauma, surgery, or amputation. It can also happen without any known injury or disease. The nerves are sending pain messages, even though there is no identifiable cause for such messages.   Other common causes of neuropathy include diabetes, phantom limb pain, or Regional Pain Syndrome (RPS).  As with all forms of chronic back pain, if neuropathy is not correctly treated, there can be a number of associated problems that lead to a downward cycle for the patient. These include depression, sleeplessness, feelings of fear and anxiety, limited social interaction and inability to do normal daily activities or work.  The most dramatic and  mysterious example of neuropathic pain is called "phantom limb syndrome." This occurs when an arm or a leg has been removed because of illness or injury. The brain still gets pain messages from the nerves that originally carried impulses from the missing limb. These nerves now seem to misfire and cause troubling pain.  Neuropathic pain often seems to have no cause. It responds poorly to standard pain treatment. Neuropathic pain can occur after:  Shingles (herpes zoster virus infection).  A lasting burning sensation of the skin, caused usually by injury to a peripheral nerve.  Peripheral neuropathy which is widespread nerve damage, often caused by diabetes or alcoholism.  Phantom limb pain following an amputation.  Facial nerve problems (trigeminal neuralgia).  Multiple sclerosis.  Reflex sympathetic dystrophy.  Pain which comes with cancer and cancer chemotherapy.  Entrapment neuropathy such as when pressure is put on a nerve such as in carpal tunnel syndrome.  Back, leg, and hip problems (sciatica).  Spine or back surgery.  HIV Infection or AIDS where nerves are infected by viruses. Your caregiver can explain items in the above list which may apply to you. SYMPTOMS  Characteristics of neuropathic pain are:  Severe, sharp, electric shock-like, shooting, lightening-like, knife-like.  Pins and needles sensation.  Deep burning, deep cold, or deep ache.  Persistent numbness, tingling, or weakness.  Pain resulting from light touch or other stimulus that would not usually cause pain.  Increased sensitivity to something that would normally cause pain, such as a pinprick. Pain may persist for months or years following the healing of damaged tissues. When this happens, pain  signals no longer sound an alarm about current injuries or injuries about to happen. Instead, the alarm system itself is not working correctly.  Neuropathic pain may get worse instead of better over time. For  some people, it can lead to serious disability. It is important to be aware that severe injury in a limb can occur without a proper, protective pain response.Burns, cuts, and other injuries may go unnoticed. Without proper treatment, these injuries can become infected or lead to further disability. Take any injury seriously, and consult your caregiver for treatment. DIAGNOSIS  When you have a pain with no known cause, your caregiver will probably ask some specific questions:   Do you have any other conditions, such as diabetes, shingles, multiple sclerosis, or HIV infection?  How would you describe your pain? (Neuropathic pain is often described as shooting, stabbing, burning, or searing.)  Is your pain worse at any time of the day? (Neuropathic pain is usually worse at night.)  Does the pain seem to follow a certain physical pathway?  Does the pain come from an area that has missing or injured nerves? (An example would be phantom limb pain.)  Is the pain triggered by minor things such as rubbing against the sheets at night? These questions often help define the type of pain involved. Once your caregiver knows what is happening, treatment can begin. Anticonvulsant, antidepressant drugs, and various pain relievers seem to work in some cases. If another condition, such as diabetes is involved, better management of that disorder may relieve the neuropathic pain.  TREATMENT  Neuropathic pain is frequently long-lasting and tends not to respond to treatment with narcotic type pain medication. It may respond well to other drugs such as antiseizure and antidepressant medications. Usually, neuropathic problems do not completely go away, but partial improvement is often possible with proper treatment. Your caregivers have large numbers of medications available to treat you. Do not be discouraged if you do not get immediate relief. Sometimes different medications or a combination of medications will be tried  before you receive the results you are hoping for. See your caregiver if you have pain that seems to be coming from nowhere and does not go away. Help is available.  SEEK IMMEDIATE MEDICAL CARE IF:   There is a sudden change in the quality of your pain, especially if the change is on only one side of the body.  You notice changes of the skin, such as redness, black or purple discoloration, swelling, or an ulcer.  You cannot move the affected limbs. Document Released: 08/08/2004 Document Revised: 02/03/2012 Document Reviewed: 08/08/2004 [facility] Patient Information 2014 [place], [place].

## 2014-01-09 NOTE — Progress Notes (Signed)
Received Incoming call from on call CM [name] Regarding Mr [name] who had an ED visit on 01/08/14 for neuropathy pain.Provided with contact number [phone]. This CM attempted to call patient on this number was number is a non-working number.CM will review EPIC and attempt to contact patient on an alternate number.

## 2014-01-09 NOTE — Progress Notes (Signed)
Attempted to call patient on [phone] this is not a working number.Attempted to call contact number on face sheet [phone].Phone is off.Will attempt number again.

## 2014-01-09 NOTE — Progress Notes (Signed)
CM contacted patient again at [phone].Patient demographics verified in EPIC.Patient requesting medication assistance and reports he has no PCP/ Health Insurance.CM educated  Patient on importance of establishing PCP services.Patient has had 8 ED visits in six Months.Patient reports he has an [facility].CM provided education on The MATCH medication program.Patient reports he has not used the program before.MATCH guidelines explained.Patient aware he needs to make a $3 Dollar co-pay per prescription.Patient Reports he can pay the program co pay.Patient educated pain medications are not covered per program guidelines.Patient educated on the locations of the [facility]. Patient ware his MATCH letter [facility] front desk.Patient thanked CM for her help.Teach back method used to ensure patients understanding.No further CM needs identified.

## 2014-01-09 NOTE — ED Provider Notes (Signed)
Medical screening examination/treatment/procedure(s) were performed by non-physician practitioner and as supervising physician I was immediately available for consultation/collaboration.  EKG Interpretation   None         Gwyneth SproutWhitney Trejan Buda, MD 01/09/14 1221

## 2014-01-14 ENCOUNTER — Inpatient Hospital Stay: Payer: Self-pay

## 2014-01-24 ENCOUNTER — Emergency Department (HOSPITAL_COMMUNITY)
Admission: EM | Admit: 2014-01-24 | Discharge: 2014-01-24 | Disposition: A | Discharge status: Home / Self Care | Payer: Self-pay | Attending: Emergency Medicine | Admitting: Emergency Medicine

## 2014-01-24 ENCOUNTER — Encounter (HOSPITAL_COMMUNITY): Payer: Self-pay | Admitting: Emergency Medicine

## 2014-01-24 DIAGNOSIS — G8929 Other chronic pain: Secondary | ICD-10-CM | POA: Insufficient documentation

## 2014-01-24 DIAGNOSIS — F17200 Nicotine dependence, unspecified, uncomplicated: Secondary | ICD-10-CM | POA: Insufficient documentation

## 2014-01-24 DIAGNOSIS — Z8719 Personal history of other diseases of the digestive system: Secondary | ICD-10-CM | POA: Insufficient documentation

## 2014-01-24 DIAGNOSIS — Z79899 Other long term (current) drug therapy: Secondary | ICD-10-CM | POA: Insufficient documentation

## 2014-01-24 DIAGNOSIS — E669 Obesity, unspecified: Secondary | ICD-10-CM | POA: Insufficient documentation

## 2014-01-24 DIAGNOSIS — I10 Essential (primary) hypertension: Secondary | ICD-10-CM | POA: Insufficient documentation

## 2014-01-24 DIAGNOSIS — M79643 Pain in unspecified hand: Secondary | ICD-10-CM

## 2014-01-24 DIAGNOSIS — Z9889 Other specified postprocedural states: Secondary | ICD-10-CM | POA: Insufficient documentation

## 2014-01-24 DIAGNOSIS — M79609 Pain in unspecified limb: Secondary | ICD-10-CM | POA: Insufficient documentation

## 2014-01-24 MED ORDER — ONDANSETRON 4 MG PO TBDP: 4.0000 mg | ORAL_TABLET | Freq: Three times a day (TID) | ORAL | Status: DC | PRN

## 2014-01-24 MED ORDER — HYDROCODONE-ACETAMINOPHEN 5-325 MG PO TABS: 1.0000 | ORAL_TABLET | ORAL | Status: DC | PRN

## 2014-01-24 NOTE — ED Notes (Addendum)
Pt reports pain to R hand for 4 days. Pain shoots up R arm and making his chronic neck pain worse. sts feels like hand locks up and feels tighter and occasionally turns purple. Bilateral hands appear red. Hx of surgery to R hand and reports cervical neck is curved. Pt also was supposed to pick something up from social worker to be able to get neuropathy prescription filled last week but was unable to get here.
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
## 2014-01-24 NOTE — Discharge Instructions (Signed)
Call for a follow up appointment with a Family or Primary Care Provider.  Return if Symptoms worsen.   Take medication as prescribed.   Ice your hand 3-4 times a day.

## 2014-01-24 NOTE — ED Provider Notes (Signed)
CSN: [id number]     Arrival date & time 01/24/14  1716 History  This chart was scribed for non-physician practitioner, Harvie Heck, PA-C,working with Alfonzo Feller, DO, by Marlowe Kays, ED Scribe.  This patient was seen in room TR11C/TR11C and the patient's care was started at 10:28 PM.  Chief Complaint  Patient presents with  . Hand Pain   The history is provided by the patient. No language interpreter was used.   HPI Comments:  [name] is a 38 y.o. obese male who presents to the Emergency Department complaining of moderate right hand pain. Pt states he had surgery on his right hand approximately 11 years ago. He deneis recent injury to the hand. Pt reports taking Ibuprofen and icing the area with no relief. He denies numbness or tingling. He denies h/o DM. He states he is a smoker. Pt states [facility] He states Dr. Rush Farmer was his orthopedic surgeon.  Past Medical History  Diagnosis Date  . Neuropathy   . GERD (gastroesophageal reflux disease)   . Chronic back pain   . Hypertension    Past Surgical History  Procedure Laterality Date  . Hand surgery    . Tonsillectomy    . Back surgery     Family History  Problem Relation Age of Onset  . Heart failure Mother   . Diabetes Father    History  Substance Use Topics  . Smoking status: Current Every Day Smoker -- 1.00 packs/day for 18 years    Types: Cigarettes  . Smokeless tobacco: Not on file  . Alcohol Use: No    Review of Systems  Constitutional: Negative for fever and chills.  Musculoskeletal: Positive for arthralgias. Negative for joint swelling.  Skin: Negative for color change, pallor and wound.  Neurological: Negative for weakness and numbness.  All other systems reviewed and are negative.    Allergies  Tramadol and Vicodin  Home Medications   Current Outpatient Rx  Name  Route  Sig  Dispense  Refill  . amphetamine-dextroamphetamine (ADDERALL) 20 MG  tablet   Oral   Take 20 mg by mouth 2 (two) times daily.         . diazepam (VALIUM) 5 MG tablet   Oral   Take 1 tablet (5 mg total) by mouth every 6 (six) hours as needed for muscle spasms.   10 tablet   0   . gabapentin (NEURONTIN) 300 MG capsule   Oral   Take 600 mg by mouth 2 (two) times daily.         Marland Kitchen gabapentin (NEURONTIN) 300 MG capsule   Oral   Take 1 capsule (300 mg total) by mouth 3 (three) times daily.   90 capsule   0   . ibuprofen (ADVIL,MOTRIN) 200 MG tablet   Oral   Take 600 mg by mouth every 8 (eight) hours as needed for mild pain.         . methocarbamol (ROBAXIN) 500 MG tablet   Oral   Take 1 tablet (500 mg total) by mouth 2 (two) times daily.   20 tablet   0   . oxyCODONE-acetaminophen (PERCOCET/ROXICET) 5-325 MG per tablet   Oral   Take 1 tablet by mouth every 6 (six) hours as needed for severe pain.         Marland Kitchen oxyCODONE-acetaminophen (PERCOCET/ROXICET) 5-325 MG per tablet   Oral   Take 1-2 tablets by mouth every 6 (six) hours  as needed for severe pain.   10 tablet   0    Triage Vitals: BP 116/76  Pulse 68  Temp(Src) 97.9 F (36.6 C)  Resp 19  Wt 346 lb (156.945 kg)  SpO2 96% Physical Exam  Nursing note and vitals reviewed. Constitutional: He is oriented to person, place, and time. He appears well-developed and well-nourished.  HENT:  Head: Normocephalic and atraumatic.  Eyes: EOM are normal.  Neck: Normal range of motion.  Cardiovascular: Normal rate.   Pulses:      Radial pulses are 2+ on the right side, and 2+ on the left side.  Pulmonary/Chest: Effort normal.  Musculoskeletal: Normal range of motion.       Right hand: He exhibits bony tenderness and swelling. He exhibits normal range of motion, normal capillary refill and no laceration. Normal sensation noted. Normal strength noted. He exhibits no finger abduction, no thumb/finger opposition and no wrist extension trouble.  Multiple well healing scars to dorsal aspect of  the hand with generalized tenderness. No crepitus, decreased sensation, erythemia.  Full ROM. Minimal swelling. Good cap refill. No anatomical snuffbox tenderness with palpation.   Neurological: He is alert and oriented to person, place, and time.  Good strength bilaterally. Neurovascularly intact.  Skin: Skin is warm and dry.  Well-healed scars to dorsum of hand from previous surgery.   Psychiatric: He has a normal mood and affect. His behavior is normal.    ED Course  Procedures (including critical care time) COORDINATION OF CARE: 10:32 PM- Will prescribe pain medication. Advised pt to follow up with his orthopedic surgeon and continue taking Ibuprofen and icing area. Pt verbalizes understanding and agrees to plan.  Medications - No data to display  Labs Review Labs Reviewed - No data to display Imaging Review No results found.   EKG Interpretation None      MDM   Final diagnoses:  Hand pain   Pt with a history of Right hand surgery comes with a non-traumatic induced pain to right hand.  NV intact, no obvious deformity.  I don't feel that emergent imaging is indicated at this time. Ice and NSAIDs encouraged. Will have pt follow up with his hand surgeon. Discussed treatment plan with the patient. Return precautions given. Reports understanding and no other concerns at this time.  Patient is stable for discharge at this time. After discharge the patient met his other two friends, that were also being evaluated for pain in the ED, all narcotic seeking.   Meds given in ED:  Medications - No data to display  Discharge Medication List as of 01/24/2014 10:40 PM    START taking these medications   Details  HYDROcodone-acetaminophen (NORCO/VICODIN) 5-325 MG per tablet Take 1 tablet by mouth every 4 (four) hours as needed., Starting 01/24/2014, Until Discontinued, Print    ondansetron (ZOFRAN ODT) 4 MG disintegrating tablet Take 1 tablet (4 mg total) by mouth every 8 (eight) hours as  needed for nausea or vomiting., Starting 01/24/2014, Until Discontinued, Print       I personally performed the services described in this documentation, which was scribed in my presence. The recorded information has been reviewed and is accurate.    Lorrine Kin, PA-C 01/27/14 1353

## 2014-01-24 NOTE — ED Notes (Signed)
PA at bedside.

## 2014-01-27 NOTE — ED Provider Notes (Signed)
Medical screening examination/treatment/procedure(s) were performed by non-physician practitioner and as supervising physician I was immediately available for consultation/collaboration.   EKG Interpretation None        Veronnica Hennings M Tilman Mcclaren, DO 01/27/14 1411 
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
## 2014-02-10 ENCOUNTER — Encounter (HOSPITAL_COMMUNITY): Payer: Self-pay | Admitting: Emergency Medicine

## 2014-02-10 ENCOUNTER — Emergency Department (HOSPITAL_COMMUNITY)
Admission: EM | Admit: 2014-02-10 | Discharge: 2014-02-10 | Disposition: A | Discharge status: Home / Self Care | Payer: Self-pay | Attending: Emergency Medicine | Admitting: Emergency Medicine

## 2014-02-10 DIAGNOSIS — M25579 Pain in unspecified ankle and joints of unspecified foot: Secondary | ICD-10-CM | POA: Insufficient documentation

## 2014-02-10 DIAGNOSIS — G8929 Other chronic pain: Secondary | ICD-10-CM | POA: Insufficient documentation

## 2014-02-10 DIAGNOSIS — I10 Essential (primary) hypertension: Secondary | ICD-10-CM | POA: Insufficient documentation

## 2014-02-10 DIAGNOSIS — K219 Gastro-esophageal reflux disease without esophagitis: Secondary | ICD-10-CM | POA: Insufficient documentation

## 2014-02-10 DIAGNOSIS — M549 Dorsalgia, unspecified: Secondary | ICD-10-CM | POA: Insufficient documentation

## 2014-02-10 DIAGNOSIS — G589 Mononeuropathy, unspecified: Secondary | ICD-10-CM | POA: Insufficient documentation

## 2014-02-10 DIAGNOSIS — F17200 Nicotine dependence, unspecified, uncomplicated: Secondary | ICD-10-CM | POA: Insufficient documentation

## 2014-02-10 DIAGNOSIS — Z79899 Other long term (current) drug therapy: Secondary | ICD-10-CM | POA: Insufficient documentation

## 2014-02-10 DIAGNOSIS — Z885 Allergy status to narcotic agent status: Secondary | ICD-10-CM | POA: Insufficient documentation

## 2014-02-10 MED ORDER — OXYCODONE-ACETAMINOPHEN 5-325 MG PO TABS
1.0000 | ORAL_TABLET | Freq: Once | ORAL | Status: AC
  Administered 2014-02-10: 1 via ORAL
  Filled 2014-02-10: qty 1

## 2014-02-10 NOTE — ED Notes (Signed)
Pt has hx of chronic back pain, radiating down both legs.

## 2014-02-10 NOTE — ED Provider Notes (Signed)
CSN: [id number]     Arrival date & time 02/10/14  1341 History  This chart was scribed for non-physician practitioner, Sharilyn SitesLisa Ester Hilley, PA-C working with Juliet RudeNathan R. Rubin PayorPickering, MD by Greggory StallionKayla Andersen, ED scribe. This patient was seen in room TR07C/TR07C and the patient's care was started at 2:48 PM.   Chief Complaint  Patient presents with  . Foot Pain  . Leg Pain   The history is provided by the patient. No language interpreter was used.   HPI Comments: [name] is a 38 y.o. male who presents to the Emergency Department complaining of chronic back pain that radiates into his right leg and bilateral foot pain. Denies new injury. Pt has been seen several times in the past for the same and given orthopedic follow up. He states he has not been to see an orthopedist because he can not afford it right now. [facility] but was not given anything for pain. He states he is trying to get an orange card but has an appointment 3/20 to get set up with a pain clinic.  No new numbness or paresthesias of LE changed from baseline.  No loss of bowel or bladder control.  Past Medical History  Diagnosis Date  . Neuropathy   . GERD (gastroesophageal reflux disease)   . Chronic back pain   . Hypertension    Past Surgical History  Procedure Laterality Date  . Hand surgery    . Tonsillectomy    . Back surgery     Family History  Problem Relation Age of Onset  . Heart failure Mother   . Diabetes Father    History  Substance Use Topics  . Smoking status: Current Every Day Smoker -- 1.00 packs/day for 18 years    Types: Cigarettes  . Smokeless tobacco: Not on file  . Alcohol Use: No    Review of Systems  Musculoskeletal: Positive for arthralgias, back pain and myalgias.  All other systems reviewed and are negative.   Allergies  Tramadol and Vicodin  Home Medications   Current Outpatient Rx  Name  Route  Sig  Dispense  Refill  . amphetamine-dextroamphetamine  (ADDERALL) 20 MG tablet   Oral   Take 20 mg by mouth 2 (two) times daily.         Marland Kitchen. HYDROcodone-acetaminophen (NORCO/VICODIN) 5-325 MG per tablet   Oral   Take 1 tablet by mouth every 4 (four) hours as needed.   6 tablet   0   . ibuprofen (ADVIL,MOTRIN) 200 MG tablet   Oral   Take 400 mg by mouth 3 (three) times daily as needed for mild pain.          Marland Kitchen. ondansetron (ZOFRAN ODT) 4 MG disintegrating tablet   Oral   Take 1 tablet (4 mg total) by mouth every 8 (eight) hours as needed for nausea or vomiting.   10 tablet   0    BP 136/88  Pulse 72  Temp(Src) 98.8 F (37.1 C) (Oral)  Resp 16  SpO2 99%  Physical Exam  Nursing note and vitals reviewed. Constitutional: He is oriented to person, place, and time. He appears well-developed and well-nourished. No distress.  HENT:  Head: Normocephalic and atraumatic.  Mouth/Throat: Oropharynx is clear and moist.  Eyes: Conjunctivae and EOM are normal. Pupils are equal, round, and reactive to light.  Neck: Normal range of motion.  Cardiovascular: Normal rate, regular rhythm and normal heart sounds.   Pulmonary/Chest:  Effort normal and breath sounds normal. No respiratory distress. He has no wheezes.  Musculoskeletal: Normal range of motion.       Lumbar back: He exhibits tenderness, bony tenderness and pain. He exhibits normal range of motion.       Back:  Tenderness to palpation and pain of right lumbar paraspinal region and SI joint;  No midline tenderness, step-off, or gross deformity; distal sensation intact; ambulating unassisted without difficulty  Neurological: He is alert and oriented to person, place, and time.  Skin: Skin is warm and dry. He is not diaphoretic.  Psychiatric: He has a normal mood and affect.    ED Course  Procedures (including critical care time)  DIAGNOSTIC STUDIES: Oxygen Saturation is 99% on RA, normal by my interpretation.    COORDINATION OF CARE: 2:50 PM-Discussed treatment plan which includes  percocet in the ED with pt at bedside and pt agreed to plan.   Labs Review Labs Reviewed - No data to display Imaging Review No results found.   EKG Interpretation None      MDM   Final diagnoses:  Chronic pain   Patient well known to the ED for chronic pain. Today his symptoms are unchanged and he has no neurologic deficit or symptoms concerning for cauda equina. Spoke with case manager who has been in contact with patient, patient has [facility] and is currently being set up for pain contract. I was instructed that he is not to receive any medication refills in the ED today.  He was again advised to FU with his PCP as previously scheduled.  I personally performed the services described in this documentation, which was scribed in my presence. The recorded information has been reviewed and is accurate.  Garlon Hatchet, PA-C 02/10/14 1542

## 2014-02-10 NOTE — Progress Notes (Signed)
02/10/14 1525 ED CM called by Morrie SheldonAshley [facility] as [facility] he requested to speak again with CM. Pt inquired how to get assist for Rx for pain medication. States ED PA did not d/c him with a Rx.  CM recommended [facility] as a walk in pt or to allow CM to send him a list of [place] self pay providers. Pt refused the list of self pay providers He informed CM "I can't read." Cm inquired if his girlfriend could assist in reading the sheet for him or if he could find someone to assist in reading it for him Pt stated "I have no one" Cm offered to name a few agencies Pt informed CM " That's ok I will find a way to get some medicines or buy some from someone" Pt concluded the call with the CM

## 2014-02-10 NOTE — ED Notes (Addendum)
bilateral foot pain and  Rt leg and back pain no new injury has been seen for same several times but no relief was sent to ortho dr but he does not have $

## 2014-02-10 NOTE — Progress Notes (Signed)
CARE MANAGEMENT ED NOTE 02/10/2014  Patient:  [name],[name]   Account Number:  [id number]  Date Initiated:  02/10/2014  Documentation initiated by:  Edd ArbourGIBBS,Pasqualina Colasurdo  Subjective/Objective Assessment:   38 yr old self pay [place] resident with 9 ED visits in the last 6 months for various pain complaints Pt noted in EPIC to [facility] PMH per pt and EPIC of Neuropathy, chronic back pain, GERD, HTn, hand surgery     Subjective/Objective Assessment Detail:   PMH back surgery  ED CM consulted at 1339 02/10/14 by [facility] after receiving a [facility] triage/registration staff when pt arrived requesting a MATCH letter (since 12/2013) to assist with pain medication  pt informed CM that he has "enough phenergan at home" therefore he does not need help with "Zofran"  He also told CM he had a pain clinic MD Pt states he gets a Rx from [facility] "for years" and [facility] to [facility] CM explained to pt that Rx from an outside facility other than [facility] will not be MATCH    EPIC indicated appt on 03/09/14 at 1700 with Dr Ayesha Rumpfeepak, adan for HFU-foot pain     Action/Plan:   CM spoke with ED SW, ED triage staff, Pt, Cm made attempts to call [facility] center to confirm appt Cm spoke with Fleet Contrasachel, [facility] ED NP/PA to update her on pt requesting to be seen for further pain mgmt even though he has a pain mgmt MD   Action/Plan Detail:   CM spoke with Sharilyn SitesLisa Sanders about pt requests and visits for pain medication Pt [facility] Plan not to sen pt out with narcotic med but refer to pcp [facility] CM entered in follow [facility] information In EPIC   Anticipated DC Date:  02/10/2014     Status Recommendation to Physician:   Result of Recommendation:    Other ED Services  Consult Working Plan    DC Planning Services  Other  PCP issues  Outpatient Services - Pt will follow up  Medication Assistance    Choice offered to / List presented to:              Status of service:  Completed, signed off  ED Comments:   ED Comments Detail:  pcp updated in EPIC as Dr Ayesha Rumpfeepak Cm spoke with pt to review his [facility] 4/15[facility]1 t 1700 with Dr Ayesha Rumpfeepak Informed him it should be listed in d/c instructions Discussed difference in care services of pcp and [facility] EDs (crisis versus monitored f/u care for chronic needs) Discussed listed EPIC [facility] for various pain concerns and need for pcp services Pt states "I am trying to get to wellness center to get a referral to pain clinic" Cm discussed with pt [email or web address] pat assistance programs for assist with Neurontin or having [facility] to assist with med assist or [facility] pharmacy to assist with Medication cost or application to patient assistance program for Neurontin Pt verbalized understanding and that he will f/u with Cm recommendations for Neurontin assist and he understand [facility] will not provide MATCH letter for a RX [facility] He voiced understanding that ED PA will make a decision on what he is d/c with  Follow-up With Details Comments Contact Info [facility]  On 03/09/2014 at 1700 03/09/14 with Dr Ayesha Rumpfeepak [address]  [place] [address] [phone]

## 2014-02-10 NOTE — ED Notes (Signed)
Pt unhappy upon discharge- states he wants a prescription for pain medication.  This RN and PA explained to patient about follow up with case management and health and wellness center.  Pt remained unhappy, security called- pt ambulatory without difficulty to discharge desk.

## 2014-02-11 NOTE — ED Provider Notes (Signed)
Medical screening examination/treatment/procedure(s) were performed by non-physician practitioner and as supervising physician I was immediately available for consultation/collaboration.   EKG Interpretation None       Nicolena Schurman R. Michaelanthony Kempton, MD 02/11/14 0657 

## 2014-03-09 ENCOUNTER — Ambulatory Visit: Payer: Self-pay | Attending: Internal Medicine | Admitting: Internal Medicine

## 2014-03-09 ENCOUNTER — Encounter: Payer: Self-pay | Admitting: Internal Medicine

## 2014-03-09 VITALS — BP 143/86 | HR 66 | Temp 97.9°F | Resp 16 | Wt 358.0 lb

## 2014-03-09 DIAGNOSIS — IMO0002 Reserved for concepts with insufficient information to code with codable children: Secondary | ICD-10-CM

## 2014-03-09 DIAGNOSIS — Z885 Allergy status to narcotic agent status: Secondary | ICD-10-CM | POA: Insufficient documentation

## 2014-03-09 DIAGNOSIS — F341 Dysthymic disorder: Secondary | ICD-10-CM

## 2014-03-09 DIAGNOSIS — Z791 Long term (current) use of non-steroidal anti-inflammatories (NSAID): Secondary | ICD-10-CM | POA: Insufficient documentation

## 2014-03-09 DIAGNOSIS — G8929 Other chronic pain: Secondary | ICD-10-CM | POA: Insufficient documentation

## 2014-03-09 DIAGNOSIS — M792 Neuralgia and neuritis, unspecified: Secondary | ICD-10-CM

## 2014-03-09 DIAGNOSIS — M545 Low back pain, unspecified: Secondary | ICD-10-CM | POA: Insufficient documentation

## 2014-03-09 DIAGNOSIS — Z79899 Other long term (current) drug therapy: Secondary | ICD-10-CM | POA: Insufficient documentation

## 2014-03-09 DIAGNOSIS — F17200 Nicotine dependence, unspecified, uncomplicated: Secondary | ICD-10-CM | POA: Insufficient documentation

## 2014-03-09 DIAGNOSIS — Z139 Encounter for screening, unspecified: Secondary | ICD-10-CM

## 2014-03-09 DIAGNOSIS — F3289 Other specified depressive episodes: Secondary | ICD-10-CM | POA: Insufficient documentation

## 2014-03-09 DIAGNOSIS — K219 Gastro-esophageal reflux disease without esophagitis: Secondary | ICD-10-CM | POA: Insufficient documentation

## 2014-03-09 DIAGNOSIS — G589 Mononeuropathy, unspecified: Secondary | ICD-10-CM | POA: Insufficient documentation

## 2014-03-09 DIAGNOSIS — I10 Essential (primary) hypertension: Secondary | ICD-10-CM | POA: Insufficient documentation

## 2014-03-09 DIAGNOSIS — Z888 Allergy status to other drugs, medicaments and biological substances status: Secondary | ICD-10-CM | POA: Insufficient documentation

## 2014-03-09 DIAGNOSIS — F419 Anxiety disorder, unspecified: Secondary | ICD-10-CM

## 2014-03-09 DIAGNOSIS — F411 Generalized anxiety disorder: Secondary | ICD-10-CM | POA: Insufficient documentation

## 2014-03-09 DIAGNOSIS — F32A Depression, unspecified: Secondary | ICD-10-CM | POA: Insufficient documentation

## 2014-03-09 DIAGNOSIS — F329 Major depressive disorder, single episode, unspecified: Secondary | ICD-10-CM | POA: Insufficient documentation

## 2014-03-09 DIAGNOSIS — K649 Unspecified hemorrhoids: Secondary | ICD-10-CM | POA: Insufficient documentation

## 2014-03-09 LAB — CBC WITH DIFFERENTIAL/PLATELET
BASOS ABS: 0 10*3/uL (ref 0.0–0.1)
Basophils Relative: 0 % (ref 0–1)
EOS PCT: 2 % (ref 0–5)
Eosinophils Absolute: 0.2 10*3/uL (ref 0.0–0.7)
HEMATOCRIT: 42.1 % (ref 39.0–52.0)
Hemoglobin: 15 g/dL (ref 13.0–17.0)
LYMPHS ABS: 3.9 10*3/uL (ref 0.7–4.0)
LYMPHS PCT: 52 % — AB (ref 12–46)
MCH: 30.7 pg (ref 26.0–34.0)
MCHC: 35.6 g/dL (ref 30.0–36.0)
MCV: 86.1 fL (ref 78.0–100.0)
MONO ABS: 0.5 10*3/uL (ref 0.1–1.0)
Monocytes Relative: 7 % (ref 3–12)
NEUTROS ABS: 2.9 10*3/uL (ref 1.7–7.7)
Neutrophils Relative %: 39 % — ABNORMAL LOW (ref 43–77)
PLATELETS: 214 10*3/uL (ref 150–400)
RBC: 4.89 MIL/uL (ref 4.22–5.81)
RDW: 13.4 % (ref 11.5–15.5)
WBC: 7.5 10*3/uL (ref 4.0–10.5)

## 2014-03-09 MED ORDER — HYDROCHLOROTHIAZIDE 12.5 MG PO TABS: 12.5000 mg | ORAL_TABLET | Freq: Every day | ORAL | Status: DC

## 2014-03-09 MED ORDER — GABAPENTIN 300 MG PO CAPS: 300.0000 mg | ORAL_CAPSULE | Freq: Three times a day (TID) | ORAL | Status: DC

## 2014-03-09 MED ORDER — OMEPRAZOLE 40 MG PO CPDR: 40.0000 mg | DELAYED_RELEASE_CAPSULE | Freq: Every day | ORAL | Status: DC

## 2014-03-09 MED ORDER — HYDROCORTISONE ACETATE 25 MG RE SUPP: 25.0000 mg | Freq: Two times a day (BID) | RECTAL | Status: DC

## 2014-03-09 MED ORDER — NICOTINE 21 MG/24HR TD PT24: 21.0000 mg | MEDICATED_PATCH | Freq: Every day | TRANSDERMAL | Status: DC

## 2014-03-09 NOTE — Progress Notes (Signed)
Patient Demographics  [name], is a 38 y.o. male  XBJ:[id number]:[id number]  HYQ:[id number]  DOB - 02-26-1976  CC:  Chief Complaint  Patient presents with  . Establish Care       HPI: [name] is a 38 y.o. male here today to establish medical care. Who has history of anxiety/depression following up with [facility] health at [facility], also history of hypertension used to be on some medication in the past, chronic lower back pain had surgery done several years ago neuropathy in both legs/feet as per patient he used to be on Neurontin the past and could not afford, currently not taking any medication, also history of GERD takes over-the-counter heartburn medication, does smoke cigarettes, advised quit smoking patient is ready to try patch, he also reported bright red blood per rectum which is up in once or twice. Patient has No headache, No chest pain, No abdominal pain - No Nausea, No new weakness tingling or numbness, No Cough - SOB.  Allergies  Allergen Reactions  . Tramadol Nausea And Vomiting  . Vicodin [Hydrocodone-Acetaminophen] Nausea And Vomiting  . Zofran [Ondansetron] Nausea And Vomiting   Past Medical History  Diagnosis Date  . Neuropathy   . GERD (gastroesophageal reflux disease)   . Chronic back pain   . Hypertension    Current Outpatient Prescriptions on File Prior to Visit  Medication Sig Dispense Refill  . amphetamine-dextroamphetamine (ADDERALL) 20 MG tablet Take 20 mg by mouth 2 (two) times daily.      Marland Kitchen. ibuprofen (ADVIL,MOTRIN) 200 MG tablet Take 400 mg by mouth 3 (three) times daily as needed for mild pain.       . Omega-3 Fatty Acids (FISH OIL) 1000 MG CAPS Take 1,000 mg by mouth daily.      . promethazine (PHENERGAN) 25 MG tablet Take 25 mg by mouth every 6 (six) hours as needed for nausea or vomiting.       No current facility-administered medications on file prior to visit.   Family History  Problem Relation Age of Onset  . Heart failure Mother     . Heart disease Mother   . Diabetes Father   . Stroke Brother    History   Social History  . Marital Status: Legally Separated    Spouse Name: N/A    Number of Children: N/A  . Years of Education: N/A   Occupational History  . Not on file.   Social History Main Topics  . Smoking status: Current Every Day Smoker -- 2.00 packs/day for 18 years    Types: Cigarettes  . Smokeless tobacco: Not on file  . Alcohol Use: No  . Drug Use: No  . Sexual Activity: Not on file   Other Topics Concern  . Not on file   Social History Narrative  . No narrative on file    Review of Systems: Constitutional: Negative for fever, chills, diaphoresis, activity change, appetite change and fatigue. HENT: Negative for ear pain, nosebleeds, congestion, facial swelling, rhinorrhea, neck pain, neck stiffness and ear discharge.  Eyes: Negative for pain, discharge, redness, itching and visual disturbance. Respiratory: Negative for cough, choking, chest tightness, shortness of breath, wheezing and stridor.  Cardiovascular: Negative for chest pain, palpitations and leg swelling. Gastrointestinal: Negative for abdominal distention. Genitourinary: Negative for dysuria, urgency, frequency, hematuria, flank pain, decreased urine volume, difficulty urinating and dyspareunia.  Musculoskeletal: Negative for back pain, joint swelling, arthralgia and gait problem. Neurological: Negative for dizziness, tremors, seizures, syncope, facial asymmetry, speech  difficulty, weakness, light-headedness, numbness and headaches.  Hematological: Negative for adenopathy. Does not bruise/bleed easily. Psychiatric/Behavioral: Negative for hallucinations, behavioral problems, confusion, dysphoric mood, decreased concentration and agitation.    Objective:   Filed Vitals:   03/09/14 1623  BP: 143/86  Pulse: 66  Temp: 97.9 F (36.6 C)  Resp: 16    Physical Exam: Constitutional: Patient appears well-developed and  well-nourished. No distress. HENT: Normocephalic, atraumatic, External right and left ear normal. Oropharynx is clear and moist.  Eyes: Conjunctivae and EOM are normal. PERRLA, no scleral icterus. Neck: Normal ROM. Neck supple. No JVD. No tracheal deviation. No thyromegaly. CVS: RRR, S1/S2 +, no murmurs, no gallops, no carotid bruit.  Pulmonary: Effort and breath sounds normal, no stridor, rhonchi, wheezes, rales.  Abdominal: Soft. BS +, no distension, tenderness, rebound or guarding.  Musculoskeletal: Low lumbar old midline surgical scar some paraspinal tenderness, with SLR test patient feels heaviness. 1+ pedal edema.  Neuro: Alert. Normal reflexes, muscle tone coordination. No cranial nerve deficit. Skin: Skin is warm and dry. No rash noted. Not diaphoretic. No erythema. No pallor. Psychiatric: Normal mood and affect. Behavior, judgment, thought content normal.  Lab Results  Component Value Date   WBC 7.9 08/26/2013   HGB 15.1 08/26/2013   HCT 41.4 08/26/2013   MCV 87.0 08/26/2013   PLT 169 08/26/2013   Lab Results  Component Value Date   CREATININE 0.77 08/26/2013   BUN 13 08/26/2013   NA 138 08/26/2013   K 3.9 08/26/2013   CL 102 08/26/2013   CO2 29 08/26/2013    Lab Results  Component Value Date   HGBA1C 5.5 12/21/2012   Lipid Panel  No results found for this basename: chol, trig, hdl, cholhdl, vldl, ldlcalc       Assessment and plan:   1. Essential hypertension, benign Advised patient for DASH diet, started on hydrochlorothiazide 12.5 mg. Patient will come back in 2 weeks for BP check. - hydrochlorothiazide (HYDRODIURIL) 12.5 MG tablet; Take 1 tablet (12.5 mg total) by mouth daily.  Dispense: 90 tablet; Refill: 3  2. Smoking  - nicotine (NICODERM CQ) 21 mg/24hr patch; Place 1 patch (21 mg total) onto the skin daily.  Dispense: 28 patch; Refill: 0  3. Anxiety and depression [facility]  4. Chronic low back pain/5. Neuropathic pain Resume back on  Neurontin. - gabapentin (NEURONTIN) 300 MG capsule; Take 1 capsule (300 mg total) by mouth 3 (three) times daily.  Dispense: 90 capsule; Refill: 3  6. Hemorrhoid  - hydrocortisone (ANUSOL-HC) 25 MG suppository; Place 1 suppository (25 mg total) rectally 2 (two) times daily.  Dispense: 12 suppository; Refill: 0  7. GERD (gastroesophageal reflux disease) Started patient on Prilosec, advise for lifestyle modification . - omeprazole (PRILOSEC) 40 MG capsule; Take 1 capsule (40 mg total) by mouth daily.  Dispense: 30 capsule; Refill: 3  8. Screening Baseline fasting blood work. - CBC with Differential - COMPLETE METABOLIC PANEL WITH GFR - TSH - Lipid panel - Vit D  25 hydroxy (rtn osteoporosis monitoring)   Return in about 3 months (around 06/08/2014) for BP check in 2 weeks/Nurse Visit.    Doris Cheadleeepak Akil Hoos, MD

## 2014-03-09 NOTE — Patient Instructions (Signed)
Smoking Cessation Quitting smoking is important to your health and has many advantages. However, it is not always easy to quit since nicotine is a very addictive drug. Often times, people try 3 times or more before being able to quit. This document explains the best ways for you to prepare to quit smoking. Quitting takes hard work and a lot of effort, but you can do it. ADVANTAGES OF QUITTING SMOKING  You will live longer, feel better, and live better.  Your body will feel the impact of quitting smoking almost immediately.  Within 20 minutes, blood pressure decreases. Your pulse returns to its normal level.  After 8 hours, carbon monoxide levels in the blood return to normal. Your oxygen level increases.  After 24 hours, the chance of having a heart attack starts to decrease. Your breath, hair, and body stop smelling like smoke.  After 48 hours, damaged nerve endings begin to recover. Your sense of taste and smell improve.  After 72 hours, the body is virtually free of nicotine. Your bronchial tubes relax and breathing becomes easier.  After 2 to 12 weeks, lungs can hold more air. Exercise becomes easier and circulation improves.  The risk of having a heart attack, stroke, cancer, or lung disease is greatly reduced.  After 1 year, the risk of coronary heart disease is cut in half.  After 5 years, the risk of stroke falls to the same as a nonsmoker.  After 10 years, the risk of lung cancer is cut in half and the risk of other cancers decreases significantly.  After 15 years, the risk of coronary heart disease drops, usually to the level of a nonsmoker.  If you are pregnant, quitting smoking will improve your chances of having a healthy baby.  The people you live with, especially any children, will be healthier.  You will have extra money to spend on things other than cigarettes. QUESTIONS TO THINK ABOUT BEFORE ATTEMPTING TO QUIT You may want to talk about your answers with your  caregiver.  Why do you want to quit?  If you tried to quit in the past, what helped and what did not?  What will be the most difficult situations for you after you quit? How will you plan to handle them?  Who can help you through the tough times? Your family? Friends? A caregiver?  What pleasures do you get from smoking? What ways can you still get pleasure if you quit? Here are some questions to ask your caregiver:  How can you help me to be successful at quitting?  What medicine do you think would be best for me and how should I take it?  What should I do if I need more help?  What is smoking withdrawal like? How can I get information on withdrawal? GET READY  Set a quit date.  Change your environment by getting rid of all cigarettes, ashtrays, matches, and lighters in your home, car, or work. Do not let people smoke in your home.  Review your past attempts to quit. Think about what worked and what did not. GET SUPPORT AND ENCOURAGEMENT You have a better chance of being successful if you have help. You can get support in many ways.  Tell your family, friends, and co-workers that you are going to quit and need their support. Ask them not to smoke around you.  Get individual, group, or telephone counseling and support. Programs are available at local hospitals and health centers. Call your local health department for   information about programs in your area.  Spiritual beliefs and practices may help some smokers quit.  Download a "quit meter" on your computer to keep track of quit statistics, such as how long you have gone without smoking, cigarettes not smoked, and money saved.  Get a self-help book about quitting smoking and staying off of tobacco. LEARN NEW SKILLS AND BEHAVIORS  Distract yourself from urges to smoke. Talk to someone, go for a walk, or occupy your time with a task.  Change your normal routine. Take a different route to work. Drink tea instead of coffee.  Eat breakfast in a different place.  Reduce your stress. Take a hot bath, exercise, or read a book.  Plan something enjoyable to do every day. Reward yourself for not smoking.  Explore interactive web-based programs that specialize in helping you quit. GET MEDICINE AND USE IT CORRECTLY Medicines can help you stop smoking and decrease the urge to smoke. Combining medicine with the above behavioral methods and support can greatly increase your chances of successfully quitting smoking.  Nicotine replacement therapy helps deliver nicotine to your body without the negative effects and risks of smoking. Nicotine replacement therapy includes nicotine gum, lozenges, inhalers, nasal sprays, and skin patches. Some may be available over-the-counter and others require a prescription.  Antidepressant medicine helps people abstain from smoking, but how this works is unknown. This medicine is available by prescription.  Nicotinic receptor partial agonist medicine simulates the effect of nicotine in your brain. This medicine is available by prescription. Ask your caregiver for advice about which medicines to use and how to use them based on your health history. Your caregiver will tell you what side effects to look out for if you choose to be on a medicine or therapy. Carefully read the information on the package. Do not use any other product containing nicotine while using a nicotine replacement product.  RELAPSE OR DIFFICULT SITUATIONS Most relapses occur within the first 3 months after quitting. Do not be discouraged if you start smoking again. Remember, most people try several times before finally quitting. You may have symptoms of withdrawal because your body is used to nicotine. You may crave cigarettes, be irritable, feel very hungry, cough often, get headaches, or have difficulty concentrating. The withdrawal symptoms are only temporary. They are strongest when you first quit, but they will go away within  10 14 days. To reduce the chances of relapse, try to:  Avoid drinking alcohol. Drinking lowers your chances of successfully quitting.  Reduce the amount of caffeine you consume. Once you quit smoking, the amount of caffeine in your body increases and can give you symptoms, such as a rapid heartbeat, sweating, and anxiety.  Avoid smokers because they can make you want to smoke.  Do not let weight gain distract you. Many smokers will gain weight when they quit, usually less than 10 pounds. Eat a healthy diet and stay active. You can always lose the weight gained after you quit.  Find ways to improve your mood other than smoking. FOR MORE INFORMATION  www.smokefree.gov  Document Released: 11/05/2001 Document Revised: 05/12/2012 Document Reviewed: 02/20/2012 ExitCare Patient Information 2014 [employer].  DASH Diet The DASH diet stands for "Dietary Approaches to Stop Hypertension." It is a healthy eating plan that has been shown to reduce high blood pressure (hypertension) in as little as 14 days, while also possibly providing other significant health benefits. These other health benefits include reducing the risk of breast cancer after menopause and reducing   the risk of type 2 diabetes, heart disease, colon cancer, and stroke. Health benefits also include weight loss and slowing kidney failure in patients with chronic kidney disease.  DIET GUIDELINES  Limit salt (sodium). Your diet should contain less than 1500 mg of sodium daily.  Limit refined or processed carbohydrates. Your diet should include mostly whole grains. Desserts and added sugars should be used sparingly.  Include small amounts of heart-healthy fats. These types of fats include nuts, oils, and tub margarine. Limit saturated and trans fats. These fats have been shown to be harmful in the body. CHOOSING FOODS  The following food groups are based on a 2000 calorie diet. See your Registered Dietitian for individual calorie  needs. Grains and Grain Products (6 to 8 servings daily)  Eat More Often: Whole-wheat bread, brown rice, whole-grain or wheat pasta, quinoa, popcorn without added fat or salt (air popped).  Eat Less Often: White bread, white pasta, white rice, cornbread. Vegetables (4 to 5 servings daily)  Eat More Often: Fresh, frozen, and canned vegetables. Vegetables may be raw, steamed, roasted, or grilled with a minimal amount of fat.  Eat Less Often/Avoid: Creamed or fried vegetables. Vegetables in a cheese sauce. Fruit (4 to 5 servings daily)  Eat More Often: All fresh, canned (in natural juice), or frozen fruits. Dried fruits without added sugar. One hundred percent fruit juice ( cup [237 mL] daily).  Eat Less Often: Dried fruits with added sugar. Canned fruit in light or heavy syrup. Lean Meats, Fish, and Poultry (2 servings or less daily. One serving is 3 to 4 oz [85-114 g]).  Eat More Often: Ninety percent or leaner ground beef, tenderloin, sirloin. Round cuts of beef, chicken breast, turkey breast. All fish. Grill, bake, or broil your meat. Nothing should be fried.  Eat Less Often/Avoid: Fatty cuts of meat, turkey, or chicken leg, thigh, or wing. Fried cuts of meat or fish. Dairy (2 to 3 servings)  Eat More Often: Low-fat or fat-free milk, low-fat plain or light yogurt, reduced-fat or part-skim cheese.  Eat Less Often/Avoid: Milk (whole, 2%).Whole milk yogurt. Full-fat cheeses. Nuts, Seeds, and Legumes (4 to 5 servings per week)  Eat More Often: All without added salt.  Eat Less Often/Avoid: Salted nuts and seeds, canned beans with added salt. Fats and Sweets (limited)  Eat More Often: Vegetable oils, tub margarines without trans fats, sugar-free gelatin. Mayonnaise and salad dressings.  Eat Less Often/Avoid: Coconut oils, palm oils, butter, stick margarine, cream, half and half, cookies, candy, pie. FOR MORE INFORMATION The Dash Diet Eating Plan: www.dashdiet.org Document  Released: 10/31/2011 Document Revised: 02/03/2012 Document Reviewed: 10/31/2011 ExitCare Patient Information 2014 [employer].  

## 2014-03-09 NOTE — Progress Notes (Signed)
Patient here to establish care  States takes medication for depression

## 2014-03-10 ENCOUNTER — Telehealth: Payer: Self-pay | Admitting: *Deleted

## 2014-03-10 ENCOUNTER — Telehealth: Payer: Self-pay

## 2014-03-10 LAB — VITAMIN D 25 HYDROXY (VIT D DEFICIENCY, FRACTURES): Vit D, 25-Hydroxy: 16 ng/mL — ABNORMAL LOW (ref 30–89)

## 2014-03-10 LAB — COMPLETE METABOLIC PANEL WITH GFR
ALT: 13 U/L (ref 0–53)
AST: 15 U/L (ref 0–37)
Albumin: 3.9 g/dL (ref 3.5–5.2)
Alkaline Phosphatase: 72 U/L (ref 39–117)
BUN: 12 mg/dL (ref 6–23)
CALCIUM: 8.7 mg/dL (ref 8.4–10.5)
CHLORIDE: 107 meq/L (ref 96–112)
CO2: 31 meq/L (ref 19–32)
CREATININE: 0.85 mg/dL (ref 0.50–1.35)
GFR, Est Non African American: >89 mL/min
Glucose, Bld: 82 mg/dL (ref 70–99)
POTASSIUM: 4.5 meq/L (ref 3.5–5.3)
Sodium: 142 mEq/L (ref 135–145)
Total Bilirubin: 0.3 mg/dL (ref 0.2–1.2)
Total Protein: 5.9 g/dL — ABNORMAL LOW (ref 6.0–8.3)

## 2014-03-10 LAB — LIPID PANEL
CHOL/HDL RATIO: 4.3 ratio
Cholesterol: 125 mg/dL (ref 0–200)
HDL: 29 mg/dL — ABNORMAL LOW (ref >39)
LDL CALC: 74 mg/dL (ref 0–99)
Triglycerides: 111 mg/dL (ref <150)
VLDL: 22 mg/dL (ref 0–40)

## 2014-03-10 LAB — TSH: TSH: 1.029 u[IU]/mL (ref 0.350–4.500)

## 2014-03-10 MED ORDER — VITAMIN D (ERGOCALCIFEROL) 1.25 MG (50000 UNIT) PO CAPS: 50000.0000 [IU] | ORAL_CAPSULE | ORAL | Status: DC

## 2014-03-10 NOTE — Telephone Encounter (Signed)
Spoke with [name] about patient blood results She will relay message to him Prescription sent to pharmacy on file

## 2014-03-10 NOTE — Telephone Encounter (Signed)
Message copied by Presly Steinruck, DENISLestine MountE L on Thu Mar 10, 2014  2:54 PM ------      Message from: Doris CheadleADVANI, DEEPAK      Created: Thu Mar 10, 2014 11:03 AM       Blood work reviewed, noticed low vitamin D, call patient advise to start ergocalciferol 50,000 units once a week for the duration of  12 weeks.       ------

## 2014-03-21 ENCOUNTER — Telehealth: Payer: Self-pay | Admitting: Internal Medicine

## 2014-03-21 NOTE — Telephone Encounter (Signed)
Pt has come in today to request a nurse visit to talk about the pain he has been having; please f/u with pt about making an appointment to talk about his pain management

## 2014-05-15 ENCOUNTER — Emergency Department (HOSPITAL_COMMUNITY)
Admission: EM | Admit: 2014-05-15 | Discharge: 2014-05-15 | Discharge status: Against Medical Advice | Payer: Self-pay | Attending: Emergency Medicine | Admitting: Emergency Medicine

## 2014-05-15 ENCOUNTER — Encounter (HOSPITAL_COMMUNITY): Payer: Self-pay | Admitting: Emergency Medicine

## 2014-05-15 ENCOUNTER — Emergency Department (HOSPITAL_COMMUNITY): Payer: Self-pay

## 2014-05-15 DIAGNOSIS — M25561 Pain in right knee: Secondary | ICD-10-CM

## 2014-05-15 DIAGNOSIS — IMO0002 Reserved for concepts with insufficient information to code with codable children: Secondary | ICD-10-CM | POA: Insufficient documentation

## 2014-05-15 DIAGNOSIS — F17200 Nicotine dependence, unspecified, uncomplicated: Secondary | ICD-10-CM | POA: Insufficient documentation

## 2014-05-15 DIAGNOSIS — G8929 Other chronic pain: Secondary | ICD-10-CM | POA: Insufficient documentation

## 2014-05-15 DIAGNOSIS — I10 Essential (primary) hypertension: Secondary | ICD-10-CM | POA: Insufficient documentation

## 2014-05-15 DIAGNOSIS — Z79899 Other long term (current) drug therapy: Secondary | ICD-10-CM | POA: Insufficient documentation

## 2014-05-15 DIAGNOSIS — M25569 Pain in unspecified knee: Secondary | ICD-10-CM | POA: Insufficient documentation

## 2014-05-15 DIAGNOSIS — H409 Unspecified glaucoma: Secondary | ICD-10-CM | POA: Insufficient documentation

## 2014-05-15 DIAGNOSIS — K219 Gastro-esophageal reflux disease without esophagitis: Secondary | ICD-10-CM | POA: Insufficient documentation

## 2014-05-15 MED ORDER — OXYCODONE-ACETAMINOPHEN 5-325 MG PO TABS: 1.0000 | ORAL_TABLET | Freq: Once | ORAL | Status: DC

## 2014-05-15 NOTE — ED Notes (Signed)
Patient transported to X-ray 

## 2014-05-15 NOTE — ED Provider Notes (Signed)
CSN: [id number]     Arrival date & time 05/15/14  1652 History  This chart was scribed for non-physician practitioner, Fayrene HelperBowie Mylinda Brook, PA-C working with Dagmar HaitWilliam Blair Walden, MD by Greggory StallionKayla Andersen, ED scribe. This patient was seen in room WTR5/WTR5 and the patient's care was started at 5:30 PM.    Chief Complaint  Patient presents with  . Leg Pain   The history is provided by the patient. No language interpreter was used.   HPI Comments: [name] is a 38 y.o. male who presents to the Emergency Department complaining of gradual onset right lower leg pain that started about one week ago. Pain is from his knee down to his ankle. Sitting in one position for too long and bearing weight worsens the pain and causes it to become cramping. States this is not his normal pain. States he had a fall about one month ago but didn't have any symptoms after the fall. Pt states he has still not gotten an orange card and states he does not have any pain management follow up. DVT negative. No recent surgery or prolonged bedrest.   Past Medical History  Diagnosis Date  . Neuropathy   . GERD (gastroesophageal reflux disease)   . Chronic back pain   . Hypertension    Past Surgical History  Procedure Laterality Date  . Hand surgery    . Tonsillectomy    . Back surgery     Family History  Problem Relation Age of Onset  . Heart failure Mother   . Heart disease Mother   . Diabetes Father   . Stroke Brother    History  Substance Use Topics  . Smoking status: Current Every Day Smoker -- 2.00 packs/day for 18 years    Types: Cigarettes  . Smokeless tobacco: Not on file  . Alcohol Use: No    Review of Systems  Musculoskeletal: Positive for myalgias.  All other systems reviewed and are negative.  Allergies  Tramadol and Zofran  Home Medications   Prior to Admission medications   Medication Sig Start Date End Date Taking? Authorizing Provider  amphetamine-dextroamphetamine (ADDERALL) 20 MG  tablet Take 20 mg by mouth 2 (two) times daily.    Historical Provider, MD  gabapentin (NEURONTIN) 300 MG capsule Take 1 capsule (300 mg total) by mouth 3 (three) times daily. 03/09/14   Doris Cheadleeepak Advani, MD  hydrochlorothiazide (HYDRODIURIL) 12.5 MG tablet Take 1 tablet (12.5 mg total) by mouth daily. 03/09/14   Doris Cheadleeepak Advani, MD  hydrocortisone (ANUSOL-HC) 25 MG suppository Place 1 suppository (25 mg total) rectally 2 (two) times daily. 03/09/14   Doris Cheadleeepak Advani, MD  ibuprofen (ADVIL,MOTRIN) 200 MG tablet Take 400 mg by mouth 3 (three) times daily as needed for mild pain.     Historical Provider, MD  nicotine (NICODERM CQ) 21 mg/24hr patch Place 1 patch (21 mg total) onto the skin daily. 03/09/14   Doris Cheadleeepak Advani, MD  Omega-3 Fatty Acids (FISH OIL) 1000 MG CAPS Take 1,000 mg by mouth daily.    Historical Provider, MD  omeprazole (PRILOSEC) 40 MG capsule Take 1 capsule (40 mg total) by mouth daily. 03/09/14   Doris Cheadleeepak Advani, MD  promethazine (PHENERGAN) 25 MG tablet Take 25 mg by mouth every 6 (six) hours as needed for nausea or vomiting.    Historical Provider, MD  Vitamin D, Ergocalciferol, (DRISDOL) 50000 UNITS CAPS capsule Take 1 capsule (50,000 Units total) by mouth every 7 (seven) days. 03/10/14   Doris Cheadleeepak Advani, MD  BP 130/73  Pulse 81  Temp(Src) 98 F (36.7 C) (Oral)  Resp 20  SpO2 97%  Physical Exam  Nursing note and vitals reviewed. Constitutional: He is oriented to person, place, and time. He appears well-developed and well-nourished. No distress.  HENT:  Head: Normocephalic and atraumatic.  Eyes: Conjunctivae and EOM are normal.  Neck: Neck supple. No tracheal deviation present.  Cardiovascular: Normal rate and intact distal pulses.   Pulmonary/Chest: Effort normal. No respiratory distress.  Musculoskeletal: Normal range of motion. He exhibits no edema.  Right knee and ankle skin tones are normal. No rash. No deformity.   Neurological: He is alert and oriented to person, place, and  time. He has normal reflexes.  Ambulate without difficulty.  Skin: Skin is warm and dry.  Psychiatric: He has a normal mood and affect. His behavior is normal.    ED Course  Procedures (including critical care time)  DIAGNOSTIC STUDIES: Oxygen Saturation is 97% on RA, normal by my interpretation.    COORDINATION OF CARE: 5:33 PM-Discussed treatment plan which includes a muscle relaxer with pt at bedside. Advised pt that narcotics will not be prescribed at this time. Will give him an orthopedic referral and advised him to follow up.  5:35 PM-Pt walked out of the ED without discharge paperwork saying that he does not want the prescription for the muscle relaxer.   Labs Review Labs Reviewed - No data to display  Imaging Review Dg Tibia/fibula Right  05/15/2014   CLINICAL DATA:  Right leg pain for 1 month  EXAM: RIGHT TIBIA AND FIBULA - 2 VIEW  COMPARISON:  None.  FINDINGS: There is no evidence of fracture or other focal bone lesions. Soft tissues are unremarkable.  IMPRESSION: Negative.   Electronically Signed   By: Elige KoHetal  Patel   On: 05/15/2014 17:22   Dg Knee Complete 4 Views Right  05/15/2014   CLINICAL DATA:  Right leg pain for 1 month  EXAM: RIGHT KNEE - COMPLETE 4+ VIEW  COMPARISON:  02/20/2011  FINDINGS: No fracture or dislocation. Osteoarthritis of the medial femorotibial compartment. No significant joint effusion. No lytic or sclerotic osseous lesion.  IMPRESSION: Mild osteoarthritis of the medial femorotibial compartment. No acute osseous injury of the right knee.   Electronically Signed   By: Elige KoHetal  Patel   On: 05/15/2014 17:22     EKG Interpretation None      MDM   Final diagnoses:  Right knee pain    BP 130/73  Pulse 81  Temp(Src) 98 F (36.7 C) (Oral)  Resp 20  SpO2 97%  I have reviewed nursing notes and vital signs. I personally reviewed the imaging tests through PACS system  I reviewed available ER/hospitalization records thought the EMR   I personally  performed the services described in this documentation, which was scribed in my presence. The recorded information has been reviewed and is accurate.  Fayrene HelperBowie Kiven Vangilder, PA-C 05/15/14 1737

## 2014-05-15 NOTE — ED Provider Notes (Signed)
Medical screening examination/treatment/procedure(s) were performed by non-physician practitioner and as supervising physician I was immediately available for consultation/collaboration.   EKG Interpretation None        Dagmar HaitWilliam Rafi Kenneth, MD 05/15/14 1924

## 2014-05-15 NOTE — ED Notes (Signed)
Pt and wife not in room, empty gown on floor.

## 2014-05-15 NOTE — ED Notes (Signed)
Pt complains of pain in R leg from knee down to ankle along shin. Pt had fall a month ago, but didn't have any symptoms after. Pt denies calf pain. Pain less with straightening leg. Pt states he has episodes of leg shaking. Pedal pulse strong

## 2014-06-08 ENCOUNTER — Ambulatory Visit: Payer: Self-pay | Admitting: Internal Medicine

## 2014-06-30 ENCOUNTER — Ambulatory Visit: Payer: Self-pay | Attending: Internal Medicine | Admitting: Internal Medicine

## 2014-06-30 ENCOUNTER — Encounter: Payer: Self-pay | Admitting: Internal Medicine

## 2014-06-30 VITALS — BP 140/92 | HR 73 | Temp 98.0°F | Resp 17 | Wt 352.6 lb

## 2014-06-30 DIAGNOSIS — M545 Low back pain, unspecified: Secondary | ICD-10-CM

## 2014-06-30 DIAGNOSIS — M792 Neuralgia and neuritis, unspecified: Secondary | ICD-10-CM

## 2014-06-30 DIAGNOSIS — IMO0002 Reserved for concepts with insufficient information to code with codable children: Secondary | ICD-10-CM

## 2014-06-30 DIAGNOSIS — F17200 Nicotine dependence, unspecified, uncomplicated: Secondary | ICD-10-CM | POA: Insufficient documentation

## 2014-06-30 DIAGNOSIS — Z79899 Other long term (current) drug therapy: Secondary | ICD-10-CM | POA: Insufficient documentation

## 2014-06-30 DIAGNOSIS — M25561 Pain in right knee: Secondary | ICD-10-CM

## 2014-06-30 DIAGNOSIS — G8929 Other chronic pain: Secondary | ICD-10-CM

## 2014-06-30 DIAGNOSIS — E559 Vitamin D deficiency, unspecified: Secondary | ICD-10-CM

## 2014-06-30 DIAGNOSIS — M79609 Pain in unspecified limb: Secondary | ICD-10-CM | POA: Insufficient documentation

## 2014-06-30 DIAGNOSIS — I10 Essential (primary) hypertension: Secondary | ICD-10-CM

## 2014-06-30 DIAGNOSIS — IMO0001 Reserved for inherently not codable concepts without codable children: Secondary | ICD-10-CM

## 2014-06-30 DIAGNOSIS — M25569 Pain in unspecified knee: Secondary | ICD-10-CM

## 2014-06-30 DIAGNOSIS — G5790 Unspecified mononeuropathy of unspecified lower limb: Secondary | ICD-10-CM | POA: Insufficient documentation

## 2014-06-30 MED ORDER — IBUPROFEN 800 MG PO TABS: 800.0000 mg | ORAL_TABLET | Freq: Three times a day (TID) | ORAL | Status: DC | PRN

## 2014-06-30 MED ORDER — GABAPENTIN 400 MG PO CAPS: 400.0000 mg | ORAL_CAPSULE | Freq: Three times a day (TID) | ORAL | Status: DC

## 2014-06-30 NOTE — Progress Notes (Signed)
MRN: [id number] Name: [name]  Sex: male Age: 38 y.o. DOB: Oct 28, 1976  Allergies: Tramadol and Zofran  Chief Complaint  Patient presents with  . Leg Pain    HPI: Patient is 38 y.o. male who has history of hypertension, anxiety/depression, chronic lower back pain neuropathic pain comes today reported to have right knee pain for the last one month denies any fall or trauma also has worsening lower back pain patient is taking Neurontin 300 mg 3 times a day, patient had a blood work done which was reviewed with the patient noticed vitamin D deficiency as per patient is taking over-the-counter 1000 unit daily. Patient is to smoke cigarettes, today's blood pressure is borderline elevated denies any headache dizziness chest and shortness of breath.  Past Medical History  Diagnosis Date  . Neuropathy   . GERD (gastroesophageal reflux disease)   . Chronic back pain   . Hypertension     Past Surgical History  Procedure Laterality Date  . Hand surgery    . Tonsillectomy    . Back surgery        Medication List       This list is accurate as of: 06/30/14 11:26 AM.  Always use your most recent med list.               amphetamine-dextroamphetamine 20 MG tablet  Commonly known as:  ADDERALL  Take 20 mg by mouth 2 (two) times daily.     Fish Oil 1000 MG Caps  Take 1,000 mg by mouth daily.     gabapentin 400 MG capsule  Commonly known as:  NEURONTIN  Take 1 capsule (400 mg total) by mouth 3 (three) times daily.     hydrochlorothiazide 12.5 MG tablet  Commonly known as:  HYDRODIURIL  Take 1 tablet (12.5 mg total) by mouth daily.     hydrocortisone 25 MG suppository  Commonly known as:  ANUSOL-HC  Place 1 suppository (25 mg total) rectally 2 (two) times daily.     ibuprofen 800 MG tablet  Commonly known as:  ADVIL,MOTRIN  Take 1 tablet (800 mg total) by mouth every 8 (eight) hours as needed.     ibuprofen 200 MG tablet  Commonly known as:  ADVIL,MOTRIN  Take  400 mg by mouth 3 (three) times daily as needed for mild pain.     nicotine 21 mg/24hr patch  Commonly known as:  NICODERM CQ  Place 1 patch (21 mg total) onto the skin daily.     omeprazole 40 MG capsule  Commonly known as:  PRILOSEC  Take 1 capsule (40 mg total) by mouth daily.     promethazine 25 MG tablet  Commonly known as:  PHENERGAN  Take 25 mg by mouth every 6 (six) hours as needed for nausea or vomiting.     Vitamin D (Ergocalciferol) 50000 UNITS Caps capsule  Commonly known as:  DRISDOL  Take 1 capsule (50,000 Units total) by mouth every 7 (seven) days.        Meds ordered this encounter  Medications  . ibuprofen (ADVIL,MOTRIN) 800 MG tablet    Sig: Take 1 tablet (800 mg total) by mouth every 8 (eight) hours as needed.    Dispense:  30 tablet    Refill:  1  . gabapentin (NEURONTIN) 400 MG capsule    Sig: Take 1 capsule (400 mg total) by mouth 3 (three) times daily.    Dispense:  90 capsule    Refill:  3  There is no immunization history on file for this patient.  Family History  Problem Relation Age of Onset  . Heart failure Mother   . Heart disease Mother   . Diabetes Father   . Stroke Brother     History  Substance Use Topics  . Smoking status: Current Every Day Smoker -- 2.00 packs/day for 18 years    Types: Cigarettes  . Smokeless tobacco: Not on file  . Alcohol Use: No    Review of Systems   As noted in HPI  Filed Vitals:   06/30/14 1030  BP: 140/92  Pulse: 73  Temp: 98 F (36.7 C)  Resp: 17    Physical Exam  Physical Exam  Eyes: EOM are normal. Pupils are equal, round, and reactive to light.  Cardiovascular: Normal rate and regular rhythm.   Pulmonary/Chest: Breath sounds normal. No respiratory distress. He has no wheezes. He has no rales.  Musculoskeletal:  Right knee tenderness medially no erythema   Lower back old midline surgical scar     CBC    Component Value Date/Time   WBC 7.5 03/09/2014 1717   RBC 4.89  03/09/2014 1717   HGB 15.0 03/09/2014 1717   HCT 42.1 03/09/2014 1717   PLT 214 03/09/2014 1717   MCV 86.1 03/09/2014 1717   LYMPHSABS 3.9 03/09/2014 1717   MONOABS 0.5 03/09/2014 1717   EOSABS 0.2 03/09/2014 1717   BASOSABS 0.0 03/09/2014 1717    CMP     Component Value Date/Time   NA 142 03/09/2014 1717   K 4.5 03/09/2014 1717   CL 107 03/09/2014 1717   CO2 31 03/09/2014 1717   GLUCOSE 82 03/09/2014 1717   BUN 12 03/09/2014 1717   CREATININE 0.85 03/09/2014 1717   CREATININE 0.77 08/26/2013 0952   CALCIUM 8.7 03/09/2014 1717   PROT 5.9* 03/09/2014 1717   ALBUMIN 3.9 03/09/2014 1717   AST 15 03/09/2014 1717   ALT 13 03/09/2014 1717   ALKPHOS 72 03/09/2014 1717   BILITOT 0.3 03/09/2014 1717   GFRNONAA >89 03/09/2014 1717   GFRNONAA >90 08/26/2013 0952   GFRAA >89 03/09/2014 1717   GFRAA >90 08/26/2013 0952    Lab Results  Component Value Date/Time   CHOL 125 03/09/2014  5:17 PM    No components found with this basename: [id number]    Lab Results  Component Value Date/Time   AST 15 03/09/2014  5:17 PM    Assessment and Plan  Neuropathic pain - Plan: I have increased the dose of Neurontin to 400 mg 3 times a day gabapentin (NEURONTIN) 400 MG capsule  Essential hypertension, benign The patient is borderline elevated advised patient for DASH diet continue with hydrochlorothiazide.  Smoking Advised patient to quit smoking.  Unspecified vitamin D deficiency Continue with over-the-counter 1000 units daily  Right knee pain - Plan: DG Knee Complete 4 Views Right, Ambulatory referral to Sports Medicine  Chronic low back pain - Plan: gabapentin (NEURONTIN) 400 MG capsule, Ambulatory referral to Sports Medicine   Return in about 3 months (around 09/30/2014) for hypertension.  Doris CheadleADVANI, Leilene Diprima, MD

## 2014-06-30 NOTE — Patient Instructions (Signed)
DASH Eating Plan °DASH stands for "Dietary Approaches to Stop Hypertension." The DASH eating plan is a healthy eating plan that has been shown to reduce high blood pressure (hypertension). Additional health benefits may include reducing the risk of type 2 diabetes mellitus, heart disease, and stroke. The DASH eating plan may also help with weight loss. °WHAT DO I NEED TO KNOW ABOUT THE DASH EATING PLAN? °For the DASH eating plan, you will follow these general guidelines: °· Choose foods with a percent daily value for sodium of less than 5% (as listed on the food label). °· Use salt-free seasonings or herbs instead of table salt or sea salt. °· Check with your health care provider or pharmacist before using salt substitutes. °· Eat lower-sodium products, often labeled as "lower sodium" or "no salt added." °· Eat fresh foods. °· Eat more vegetables, fruits, and low-fat dairy products. °· Choose whole grains. Look for the word "whole" as the first word in the ingredient list. °· Choose fish and skinless chicken or turkey more often than red meat. Limit fish, poultry, and meat to 6 oz (170 g) each day. °· Limit sweets, desserts, sugars, and sugary drinks. °· Choose heart-healthy fats. °· Limit cheese to 1 oz (28 g) per day. °· Eat more home-cooked food and less restaurant, buffet, and fast food. °· Limit fried foods. °· Cook foods using methods other than frying. °· Limit canned vegetables. If you do use them, rinse them well to decrease the sodium. °· When eating at a restaurant, ask that your food be prepared with less salt, or no salt if possible. °WHAT FOODS CAN I EAT? °Seek help from a dietitian for individual calorie needs. °Grains °Whole grain or whole wheat bread. Brown rice. Whole grain or whole wheat pasta. Quinoa, bulgur, and whole grain cereals. Low-sodium cereals. Corn or whole wheat flour tortillas. Whole grain cornbread. Whole grain crackers. Low-sodium crackers. °Vegetables °Fresh or frozen vegetables  (raw, steamed, roasted, or grilled). Low-sodium or reduced-sodium tomato and vegetable juices. Low-sodium or reduced-sodium tomato sauce and paste. Low-sodium or reduced-sodium canned vegetables.  °Fruits °All fresh, canned (in natural juice), or frozen fruits. °Meat and Other Protein Products °Ground beef (85% or leaner), grass-fed beef, or beef trimmed of fat. Skinless chicken or turkey. Ground chicken or turkey. Pork trimmed of fat. All fish and seafood. Eggs. Dried beans, peas, or lentils. Unsalted nuts and seeds. Unsalted canned beans. °Dairy °Low-fat dairy products, such as skim or 1% milk, 2% or reduced-fat cheeses, low-fat ricotta or cottage cheese, or plain low-fat yogurt. Low-sodium or reduced-sodium cheeses. °Fats and Oils °Tub margarines without trans fats. Light or reduced-fat mayonnaise and salad dressings (reduced sodium). Avocado. Safflower, olive, or canola oils. Natural peanut or almond butter. °Other °Unsalted popcorn and pretzels. °The items listed above may not be a complete list of recommended foods or beverages. Contact your dietitian for more options. °WHAT FOODS ARE NOT RECOMMENDED? °Grains °White bread. White pasta. White rice. Refined cornbread. Bagels and croissants. Crackers that contain trans fat. °Vegetables °Creamed or fried vegetables. Vegetables in a cheese sauce. Regular canned vegetables. Regular canned tomato sauce and paste. Regular tomato and vegetable juices. °Fruits °Dried fruits. Canned fruit in light or heavy syrup. Fruit juice. °Meat and Other Protein Products °Fatty cuts of meat. Ribs, chicken wings, bacon, sausage, bologna, salami, chitterlings, fatback, hot dogs, bratwurst, and packaged luncheon meats. Salted nuts and seeds. Canned beans with salt. °Dairy °Whole or 2% milk, cream, half-and-half, and cream cheese. Whole-fat or sweetened yogurt. Full-fat   cheeses or blue cheese. Nondairy creamers and whipped toppings. Processed cheese, cheese spreads, or cheese  curds. °Condiments °Onion and garlic salt, seasoned salt, table salt, and sea salt. Canned and packaged gravies. Worcestershire sauce. Tartar sauce. Barbecue sauce. Teriyaki sauce. Soy sauce, including reduced sodium. Steak sauce. Fish sauce. Oyster sauce. Cocktail sauce. Horseradish. Ketchup and mustard. Meat flavorings and tenderizers. Bouillon cubes. Hot sauce. Tabasco sauce. Marinades. Taco seasonings. Relishes. °Fats and Oils °Butter, stick margarine, lard, shortening, ghee, and bacon fat. Coconut, palm kernel, or palm oils. Regular salad dressings. °Other °Pickles and olives. Salted popcorn and pretzels. °The items listed above may not be a complete list of foods and beverages to avoid. Contact your dietitian for more information. °WHERE CAN I FIND MORE INFORMATION? °[employer], [employer], [employer]: www.nhlbi.nih.gov/health/health-topics/topics/dash/ °Document Released: 10/31/2011 Document Revised: 03/28/2014 Document Reviewed: 09/15/2013 °[employer]® Patient Information ©2015 [employer]. This information is not intended to replace advice given to you by your health care provider. Make sure you discuss any questions you have with your health care provider. ° °

## 2014-06-30 NOTE — Progress Notes (Signed)
Patient complains of pain to both legs the past three nights Has not slept due to the pain Did state he suffers from nueropathy

## 2014-07-05 ENCOUNTER — Ambulatory Visit (HOSPITAL_COMMUNITY)
Admission: RE | Admit: 2014-07-05 | Discharge: 2014-07-05 | Disposition: A | Discharge status: Home / Self Care | Payer: Self-pay | Source: Ambulatory Visit | Attending: Internal Medicine | Admitting: Internal Medicine

## 2014-07-05 DIAGNOSIS — M2340 Loose body in knee, unspecified knee: Secondary | ICD-10-CM | POA: Insufficient documentation

## 2014-07-05 DIAGNOSIS — M25561 Pain in right knee: Secondary | ICD-10-CM

## 2014-07-17 ENCOUNTER — Encounter (HOSPITAL_COMMUNITY): Payer: Self-pay | Admitting: Emergency Medicine

## 2014-07-17 ENCOUNTER — Emergency Department (HOSPITAL_COMMUNITY)
Admission: EM | Admit: 2014-07-17 | Discharge: 2014-07-17 | Disposition: A | Discharge status: Home / Self Care | Payer: Self-pay | Attending: Emergency Medicine | Admitting: Emergency Medicine

## 2014-07-17 DIAGNOSIS — I10 Essential (primary) hypertension: Secondary | ICD-10-CM | POA: Insufficient documentation

## 2014-07-17 DIAGNOSIS — F17200 Nicotine dependence, unspecified, uncomplicated: Secondary | ICD-10-CM | POA: Insufficient documentation

## 2014-07-17 DIAGNOSIS — Z8669 Personal history of other diseases of the nervous system and sense organs: Secondary | ICD-10-CM | POA: Insufficient documentation

## 2014-07-17 DIAGNOSIS — Z8614 Personal history of Methicillin resistant Staphylococcus aureus infection: Secondary | ICD-10-CM | POA: Insufficient documentation

## 2014-07-17 DIAGNOSIS — K219 Gastro-esophageal reflux disease without esophagitis: Secondary | ICD-10-CM | POA: Insufficient documentation

## 2014-07-17 DIAGNOSIS — L089 Local infection of the skin and subcutaneous tissue, unspecified: Secondary | ICD-10-CM | POA: Insufficient documentation

## 2014-07-17 DIAGNOSIS — G8929 Other chronic pain: Secondary | ICD-10-CM | POA: Insufficient documentation

## 2014-07-17 DIAGNOSIS — L03019 Cellulitis of unspecified finger: Principal | ICD-10-CM

## 2014-07-17 DIAGNOSIS — L02519 Cutaneous abscess of unspecified hand: Secondary | ICD-10-CM | POA: Insufficient documentation

## 2014-07-17 DIAGNOSIS — Z79899 Other long term (current) drug therapy: Secondary | ICD-10-CM | POA: Insufficient documentation

## 2014-07-17 MED ORDER — CEPHALEXIN 250 MG PO CAPS
1000.0000 mg | ORAL_CAPSULE | Freq: Once | ORAL | Status: AC
  Administered 2014-07-17: 1000 mg via ORAL
  Filled 2014-07-17: qty 4

## 2014-07-17 MED ORDER — KETOROLAC TROMETHAMINE 60 MG/2ML IM SOLN
60.0000 mg | Freq: Once | INTRAMUSCULAR | Status: AC
  Administered 2014-07-17: 60 mg via INTRAMUSCULAR
  Filled 2014-07-17: qty 2

## 2014-07-17 MED ORDER — SULFAMETHOXAZOLE-TRIMETHOPRIM 800-160 MG PO TABS: 1.0000 | ORAL_TABLET | Freq: Two times a day (BID) | ORAL | Status: DC

## 2014-07-17 MED ORDER — CEPHALEXIN 500 MG PO CAPS: 500.0000 mg | ORAL_CAPSULE | Freq: Four times a day (QID) | ORAL | Status: DC

## 2014-07-17 MED ORDER — SULFAMETHOXAZOLE-TMP DS 800-160 MG PO TABS
1.0000 | ORAL_TABLET | Freq: Once | ORAL | Status: AC
  Administered 2014-07-17: 1 via ORAL
  Filled 2014-07-17: qty 1

## 2014-07-17 NOTE — ED Provider Notes (Signed)
CSN: [id number]     Arrival date & time 07/17/14  1546 History  This chart was scribed for non-physician practitioner Dalia Heading, PA-C, working with Elwin Mocha, MD, by Yevette Edwards, ED Scribe. This patient was seen in room TR11C/TR11C and the patient's care was started at 5:38 PM.   First MD Initiated Contact with Patient 07/17/14 1620     Chief Complaint  Patient presents with  . Follow-up  . Wound Infection    HPI HPI Comments: [name] is a 38 y.o. male who presents to the Emergency Department complaining of a suspected abscess to his right third fingertip which began approximately 10 days, began to improve, and then worsened three days ago. He endorses pain to the finger which he characterizes as "throbbing" and "stinging." The pt Ramirez the site opened a week ago when he accidentally injured it, but then closed again and is not actively draining. [name] a h/o MRSA to his left arm last year; he states he has had MRSA several times. He also endorses chronic knee pain which he rates as 9/10.  The pt is a current smoker.   Past Medical History  Diagnosis Date  . Neuropathy   . GERD (gastroesophageal reflux disease)   . Chronic back pain   . Hypertension    Past Surgical History  Procedure Laterality Date  . Hand surgery    . Tonsillectomy    . Back surgery     Family History  Problem Relation Age of Onset  . Heart failure Mother   . Heart disease Mother   . Diabetes Father   . Stroke Brother    History  Substance Use Topics  . Smoking status: Current Every Day Smoker -- 2.00 packs/day for 18 years    Types: Cigarettes  . Smokeless tobacco: Not on file  . Alcohol Use: No    Review of Systems  Constitutional: Negative for fever, diaphoresis, appetite change, fatigue and unexpected weight change.  HENT: Negative for mouth sores.   Eyes: Negative for visual disturbance.  Respiratory: Negative for cough, chest tightness, shortness of  breath and wheezing.   Cardiovascular: Negative for chest pain.  Gastrointestinal: Negative for nausea, vomiting, abdominal pain, diarrhea and constipation.  Endocrine: Negative for polydipsia, polyphagia and polyuria.  Genitourinary: Negative for dysuria, urgency, frequency and hematuria.  Musculoskeletal: Negative for back pain and neck stiffness.  Skin: Negative for rash.  Allergic/Immunologic: Negative for immunocompromised state.  Neurological: Negative for syncope, light-headedness and headaches.  Hematological: Does not bruise/bleed easily.  Psychiatric/Behavioral: Negative for sleep disturbance. The patient is not nervous/anxious.       Allergies  Tramadol and Zofran  Home Medications   Prior to Admission medications   Medication Sig Start Date End Date Taking? Authorizing Provider  amphetamine-dextroamphetamine (ADDERALL) 20 MG tablet Take 20 mg by mouth 2 (two) times daily.    Historical Provider, MD  cephALEXin (KEFLEX) 500 MG capsule Take 1 capsule (500 mg total) by mouth 4 (four) times daily. 07/17/14   Gaila Engebretsen, PA-C  gabapentin (NEURONTIN) 400 MG capsule Take 1 capsule (400 mg total) by mouth 3 (three) times daily. 06/30/14   Doris Cheadle, MD  hydrochlorothiazide (HYDRODIURIL) 12.5 MG tablet Take 1 tablet (12.5 mg total) by mouth daily. 03/09/14   Doris Cheadle, MD  hydrocortisone (ANUSOL-HC) 25 MG suppository Place 1 suppository (25 mg total) rectally 2 (two) times daily. 03/09/14   Doris Cheadle, MD  ibuprofen (ADVIL,MOTRIN) 200 MG tablet Take 400 mg by  mouth 3 (three) times daily as needed for mild pain.     Historical Provider, MD  ibuprofen (ADVIL,MOTRIN) 800 MG tablet Take 1 tablet (800 mg total) by mouth every 8 (eight) hours as needed. 06/30/14   Doris Cheadle, MD  nicotine (NICODERM CQ) 21 mg/24hr patch Place 1 patch (21 mg total) onto the skin daily. 03/09/14   Doris Cheadle, MD  Omega-3 Fatty Acids (FISH OIL) 1000 MG CAPS Take 1,000 mg by mouth daily.     Historical Provider, MD  omeprazole (PRILOSEC) 40 MG capsule Take 1 capsule (40 mg total) by mouth daily. 03/09/14   Doris Cheadle, MD  promethazine (PHENERGAN) 25 MG tablet Take 25 mg by mouth every 6 (six) hours as needed for nausea or vomiting.    Historical Provider, MD  sulfamethoxazole-trimethoprim (SEPTRA DS) 800-160 MG per tablet Take 1 tablet by mouth every 12 (twelve) hours. 07/17/14   Kartik Fernando, PA-C  Vitamin D, Ergocalciferol, (DRISDOL) 50000 UNITS CAPS capsule Take 1 capsule (50,000 Units total) by mouth every 7 (seven) days. 03/10/14   Doris Cheadle, MD   Triage Vitals: BP 142/94  Pulse 84  Temp(Src) 97.7 F (36.5 C) (Oral)  Resp 24  SpO2 96%  Physical Exam  Nursing note and vitals reviewed. Constitutional: He is oriented to person, place, and time. He appears well-developed and well-nourished. No distress.  HENT:  Head: Normocephalic and atraumatic.  Eyes: Conjunctivae are normal. No scleral icterus.  Neck: Normal range of motion.  Cardiovascular: Normal rate, regular rhythm, normal heart sounds and intact distal pulses.   No murmur heard. Capillary refill < 3 sec  Pulmonary/Chest: Effort normal and breath sounds normal. No respiratory distress.  Musculoskeletal:  Full ROM of all joints in all fingers of the right hand NO swelling or erythema of any joint in the right hand  Lymphadenopathy:    He has no cervical adenopathy.  Neurological: He is alert and oriented to person, place, and time.  Sensation intact to dull and sharp  Skin: Skin is warm and dry. He is not diaphoretic. There is erythema.  Mild erythema surrounding an old laceration of the medial right middle finger No induration or evidence of abscess No paronychia No fullness, erythema or pain to palpation of the pad of the finger  Psychiatric: He has a normal mood and affect.    ED Course  Procedures (including critical care time)  DIAGNOSTIC STUDIES: Oxygen Saturation is 96% on room air,  normal by my interpretation.    COORDINATION OF CARE:  5:41 PM- Discussed treatment plan with patient, and the patient agreed to the plan. The plan includes a course of antibiotics. Advised pt to use warm soaks multiple times a day.  Informed pt to [facility] in two days. Return precautions given.   Labs Review Labs Reviewed - No data to display  Imaging Review No results found.   EKG Interpretation None      MDM   Final diagnoses:  Finger infection   Dante Gang presents with mild cellulitis surrounding an old wound to the right middle finger.  Pt without evidence of abscess, felon or paronychia.  No evidence of septic joint.  No systemic symptoms.  Pt with Hx of MRSA.  Will d/c home with bactrim and keflex for coverage since he has no insurance.    Pt is without risk factors for HIV; no recent use of steroids or other immunosuppressive medications; no Hx of diabetes.  Pt is without  gross abscess for which I&D would be possible.  Area marked and pt encouraged to return if redness begins to streak, extends beyond the markings, and/or fever or nausea/vomiting develop.  Pt is alert, oriented, NAD, afebrile, non tachycardic, nonseptic and nontoxic appearing.  Pt to be d/c on oral antibiotics with strict f/u instructions.    BP 127/86  Pulse 67  Temp(Src) 97.4 F (36.3 C) (Oral)  Resp 22  SpO2 100%   I personally performed the services described in this documentation, which was scribed in my presence. The recorded information has been reviewed and is accurate.    Dahlia Client Kamare Caspers, PA-C 07/17/14 2151

## 2014-07-17 NOTE — Discharge Instructions (Signed)
1. Medications: bactrim, keflex, usual home medications 2. Treatment: rest, drink plenty of fluids, warm water soaks 3x per day 3. Follow Up: [facility]up [facility] in [facility] days for wound check or return to the ED for increasing swelling, redness or streaking

## 2014-07-17 NOTE — ED Notes (Signed)
He states he has a wound on R middle fingertip that has been increasingly painful over past week. He also c/o R knee  pain, [facility] but they wont call him back with the test results and he would like for someone to look at the results for him

## 2014-07-18 NOTE — ED Provider Notes (Signed)
Medical screening examination/treatment/procedure(s) were performed by non-physician practitioner and as supervising physician I was immediately available for consultation/collaboration.   EKG Interpretation None        Elwin Mocha, MD 07/18/14 [phone]

## 2014-07-26 ENCOUNTER — Ambulatory Visit: Payer: Self-pay | Attending: Internal Medicine

## 2014-07-26 ENCOUNTER — Telehealth: Payer: Self-pay | Admitting: Internal Medicine

## 2014-07-26 NOTE — Telephone Encounter (Signed)
Patient came in today requesting a f/u call from the nurse about his [employer] application; please f/u with patient

## 2014-08-02 ENCOUNTER — Telehealth: Payer: Self-pay | Admitting: Internal Medicine

## 2014-08-02 NOTE — Telephone Encounter (Signed)
Pt. Called to request x-ray results. Please f/u with pt

## 2014-09-06 ENCOUNTER — Emergency Department (HOSPITAL_COMMUNITY)
Admission: EM | Admit: 2014-09-06 | Discharge: 2014-09-06 | Disposition: A | Discharge status: Home / Self Care | Payer: Self-pay | Attending: Emergency Medicine | Admitting: Emergency Medicine

## 2014-09-06 ENCOUNTER — Encounter (HOSPITAL_COMMUNITY): Payer: Self-pay | Admitting: Emergency Medicine

## 2014-09-06 DIAGNOSIS — S6391XA Sprain of unspecified part of right wrist and hand, initial encounter: Secondary | ICD-10-CM | POA: Insufficient documentation

## 2014-09-06 DIAGNOSIS — Z792 Long term (current) use of antibiotics: Secondary | ICD-10-CM | POA: Insufficient documentation

## 2014-09-06 DIAGNOSIS — M25561 Pain in right knee: Secondary | ICD-10-CM | POA: Insufficient documentation

## 2014-09-06 DIAGNOSIS — Z79899 Other long term (current) drug therapy: Secondary | ICD-10-CM | POA: Insufficient documentation

## 2014-09-06 DIAGNOSIS — Z7952 Long term (current) use of systemic steroids: Secondary | ICD-10-CM | POA: Insufficient documentation

## 2014-09-06 DIAGNOSIS — Y9289 Other specified places as the place of occurrence of the external cause: Secondary | ICD-10-CM | POA: Insufficient documentation

## 2014-09-06 DIAGNOSIS — Y9389 Activity, other specified: Secondary | ICD-10-CM | POA: Insufficient documentation

## 2014-09-06 DIAGNOSIS — K219 Gastro-esophageal reflux disease without esophagitis: Secondary | ICD-10-CM | POA: Insufficient documentation

## 2014-09-06 DIAGNOSIS — I10 Essential (primary) hypertension: Secondary | ICD-10-CM | POA: Insufficient documentation

## 2014-09-06 DIAGNOSIS — G8929 Other chronic pain: Secondary | ICD-10-CM | POA: Insufficient documentation

## 2014-09-06 DIAGNOSIS — Z9889 Other specified postprocedural states: Secondary | ICD-10-CM | POA: Insufficient documentation

## 2014-09-06 DIAGNOSIS — Z791 Long term (current) use of non-steroidal anti-inflammatories (NSAID): Secondary | ICD-10-CM | POA: Insufficient documentation

## 2014-09-06 DIAGNOSIS — X58XXXA Exposure to other specified factors, initial encounter: Secondary | ICD-10-CM | POA: Insufficient documentation

## 2014-09-06 DIAGNOSIS — Z720 Tobacco use: Secondary | ICD-10-CM | POA: Insufficient documentation

## 2014-09-06 DIAGNOSIS — G629 Polyneuropathy, unspecified: Secondary | ICD-10-CM | POA: Insufficient documentation

## 2014-09-06 NOTE — Discharge Instructions (Signed)

## 2014-09-06 NOTE — ED Notes (Signed)
Rt knee pain states went to [facility] and was suppose to get xray but they have not called him back states it keeps locking up, and his  Rt hand pain also no new injury

## 2014-09-06 NOTE — ED Provider Notes (Signed)
CSN: [id number]     Arrival date & time 09/06/14  1112 History  This chart was scribed for non-physician practitioner, Arnoldo HookerShari A Delmy Holdren, PA-C, working with Lyanne CoKevin M Campos, MD by Charline BillsEssence Howell, ED Scribe. This patient was seen in room TR08C/TR08C and the patient's care was started at 11:46 AM.   Chief Complaint  Patient presents with  . Knee Pain   The history is provided by the patient. No language interpreter was used.   HPI Comments: [name] is a 38 y.o. male, with a h/o neuropathy, who presents to the Emergency Department complaining of constant R knee pain onset 3 months ago. Pt states that he has fallen 3 times in the past month due to his knees "giving out"; most recent 1.5 week ago. Pt describes the pain as a shooting and stabbing sensation. Pain is exacerbated with walking and bending. Nothing eases pain. He also reports associated R hand pain with associated swelling from catching himself upon falling. He denies chest pain, SOB, fever, or any other symptoms at this time. Pt has taken 6 200 mg ibuprofen tablets x3 daily, Aleve, Goody power without relief. Pt was [facility] to the [facility] who prescribed him Neurontin and 800 mg ibuprofen; last seen beginning of August 2015. Pt states that he was also given Vicodin that he does not take due to side effect of emesis.   Past Medical History  Diagnosis Date  . Neuropathy   . GERD (gastroesophageal reflux disease)   . Chronic back pain   . Hypertension    Past Surgical History  Procedure Laterality Date  . Hand surgery    . Tonsillectomy    . Back surgery     Family History  Problem Relation Age of Onset  . Heart failure Mother   . Heart disease Mother   . Diabetes Father   . Stroke Brother    History  Substance Use Topics  . Smoking status: Current Every Day Smoker -- 2.00 packs/day for 18 years    Types: Cigarettes  . Smokeless tobacco: Not on file  . Alcohol Use: No    Review of Systems  Constitutional:  Negative for fatigue.  Respiratory: Negative for shortness of breath.   Cardiovascular: Negative for chest pain.  Musculoskeletal: Positive for arthralgias and joint swelling.   Allergies  Tramadol; Vicodin; and Zofran  Home Medications   Prior to Admission medications   Medication Sig Start Date End Date Taking? Authorizing Provider  amphetamine-dextroamphetamine (ADDERALL) 20 MG tablet Take 20 mg by mouth 2 (two) times daily.    Historical Provider, MD  cephALEXin (KEFLEX) 500 MG capsule Take 1 capsule (500 mg total) by mouth 4 (four) times daily. 07/17/14   Hannah Muthersbaugh, PA-C  gabapentin (NEURONTIN) 400 MG capsule Take 1 capsule (400 mg total) by mouth 3 (three) times daily. 06/30/14   Doris Cheadleeepak Advani, MD  hydrochlorothiazide (HYDRODIURIL) 12.5 MG tablet Take 1 tablet (12.5 mg total) by mouth daily. 03/09/14   Doris Cheadleeepak Advani, MD  hydrocortisone (ANUSOL-HC) 25 MG suppository Place 1 suppository (25 mg total) rectally 2 (two) times daily. 03/09/14   Doris Cheadleeepak Advani, MD  ibuprofen (ADVIL,MOTRIN) 200 MG tablet Take 400 mg by mouth 3 (three) times daily as needed for mild pain.     Historical Provider, MD  ibuprofen (ADVIL,MOTRIN) 800 MG tablet Take 1 tablet (800 mg total) by mouth every 8 (eight) hours as needed. 06/30/14   Doris Cheadleeepak Advani, MD  nicotine (NICODERM CQ) 21 mg/24hr patch Place 1 patch (  21 mg total) onto the skin daily. 03/09/14   Doris Cheadleeepak Advani, MD  Omega-3 Fatty Acids (FISH OIL) 1000 MG CAPS Take 1,000 mg by mouth daily.    Historical Provider, MD  omeprazole (PRILOSEC) 40 MG capsule Take 1 capsule (40 mg total) by mouth daily. 03/09/14   Doris Cheadleeepak Advani, MD  promethazine (PHENERGAN) 25 MG tablet Take 25 mg by mouth every 6 (six) hours as needed for nausea or vomiting.    Historical Provider, MD  sulfamethoxazole-trimethoprim (SEPTRA DS) 800-160 MG per tablet Take 1 tablet by mouth every 12 (twelve) hours. 07/17/14   Hannah Muthersbaugh, PA-C  Vitamin D, Ergocalciferol, (DRISDOL) 50000 UNITS  CAPS capsule Take 1 capsule (50,000 Units total) by mouth every 7 (seven) days. 03/10/14   Doris Cheadleeepak Advani, MD   Triage Vitals: BP 140/89  Pulse 74  Temp(Src) 98.7 F (37.1 C) (Oral)  Resp 16  SpO2 97% Physical Exam  Nursing note and vitals reviewed. Constitutional: He is oriented to person, place, and time. He appears well-developed and well-nourished.  HENT:  Head: Normocephalic and atraumatic.  Eyes: Conjunctivae and EOM are normal.  Neck: Neck supple.  Cardiovascular: Normal rate and regular rhythm.   Pulmonary/Chest: Effort normal and breath sounds normal. He has no wheezes. He has no rales.  Musculoskeletal: Normal range of motion.  R hand: R middle finger tenderness to MCP joint with Mild swelling but without bony deformity or discoloration  R knee: R patellar mild swelling No erythema or warmth Mildly tender to palpation Full ROM Sensation intact Feet:  Decreased sensation, chronic sensation associatd with chronic neuropathy  Neurological: He is alert and oriented to person, place, and time.  Ambulatory and fully weight bearing.  Skin: Skin is warm and dry.  Psychiatric: He has a normal mood and affect. His behavior is normal.   ED Course  Procedures (including critical care time) DIAGNOSTIC STUDIES: Oxygen Saturation is 97% on RA, normal by my interpretation.    COORDINATION OF CARE: 12:02 PM-Discussed treatment plan with pt at bedside and pt agreed to plan.   Labs Review Labs Reviewed - No data to display  Imaging Review No results found.   EKG Interpretation None      MDM   Final diagnoses:  None    1. Chronic right knee pain 2. Right hand sprain  No concern for fracture injury, patient in agreement. Review of database and patient record shows adequate treatment for chronic pain with Dr. Mikeal HawthorneGarba, last received regular #120 Vicodin on 08/16/14 as he does every month despite complaint that he "can't take Vicodin". He is well appearing otherwise. Stable  for discharge without further Rx's.  I personally performed the services described in this documentation, which was scribed in my presence. The recorded information has been reviewed and is accurate.    Arnoldo HookerShari A Zevin Nevares, PA-C 09/06/14 1221

## 2014-09-07 NOTE — ED Provider Notes (Signed)
Medical screening examination/treatment/procedure(s) were performed by non-physician practitioner and as supervising physician I was immediately available for consultation/collaboration.   EKG Interpretation None        Lyanne CoKevin M Perri Aragones, MD 09/07/14 [phone]

## 2014-10-19 ENCOUNTER — Ambulatory Visit: Payer: Self-pay | Admitting: Internal Medicine

## 2014-11-05 ENCOUNTER — Encounter (HOSPITAL_COMMUNITY): Payer: Self-pay | Admitting: Emergency Medicine

## 2014-11-05 ENCOUNTER — Emergency Department (HOSPITAL_COMMUNITY)
Admission: EM | Admit: 2014-11-05 | Discharge: 2014-11-05 | Disposition: A | Discharge status: Home / Self Care | Payer: Self-pay | Attending: Emergency Medicine | Admitting: Emergency Medicine

## 2014-11-05 DIAGNOSIS — Z7952 Long term (current) use of systemic steroids: Secondary | ICD-10-CM | POA: Insufficient documentation

## 2014-11-05 DIAGNOSIS — I10 Essential (primary) hypertension: Secondary | ICD-10-CM | POA: Insufficient documentation

## 2014-11-05 DIAGNOSIS — G629 Polyneuropathy, unspecified: Secondary | ICD-10-CM | POA: Insufficient documentation

## 2014-11-05 DIAGNOSIS — Z792 Long term (current) use of antibiotics: Secondary | ICD-10-CM | POA: Insufficient documentation

## 2014-11-05 DIAGNOSIS — J069 Acute upper respiratory infection, unspecified: Secondary | ICD-10-CM | POA: Insufficient documentation

## 2014-11-05 DIAGNOSIS — Z720 Tobacco use: Secondary | ICD-10-CM | POA: Insufficient documentation

## 2014-11-05 DIAGNOSIS — K219 Gastro-esophageal reflux disease without esophagitis: Secondary | ICD-10-CM | POA: Insufficient documentation

## 2014-11-05 DIAGNOSIS — R05 Cough: Secondary | ICD-10-CM

## 2014-11-05 DIAGNOSIS — R059 Cough, unspecified: Secondary | ICD-10-CM

## 2014-11-05 DIAGNOSIS — G8929 Other chronic pain: Secondary | ICD-10-CM | POA: Insufficient documentation

## 2014-11-05 DIAGNOSIS — Z79899 Other long term (current) drug therapy: Secondary | ICD-10-CM | POA: Insufficient documentation

## 2014-11-05 MED ORDER — KETOROLAC TROMETHAMINE 60 MG/2ML IM SOLN
60.0000 mg | Freq: Once | INTRAMUSCULAR | Status: AC
Start: 2014-11-05 — End: 2014-11-05
  Administered 2014-11-05: 60 mg via INTRAMUSCULAR
  Filled 2014-11-05: qty 2

## 2014-11-05 MED ORDER — BENZONATATE 100 MG PO CAPS: 100.0000 mg | ORAL_CAPSULE | Freq: Three times a day (TID) | ORAL | Status: DC

## 2014-11-05 MED ORDER — ALBUTEROL SULFATE HFA 108 (90 BASE) MCG/ACT IN AERS
2.0000 | INHALATION_SPRAY | Freq: Once | RESPIRATORY_TRACT | Status: AC
  Administered 2014-11-05: 2 via RESPIRATORY_TRACT
  Filled 2014-11-05: qty 6.7

## 2014-11-05 NOTE — ED Notes (Signed)
Declined W/C at D/C and was escorted to lobby by RN. 

## 2014-11-05 NOTE — ED Notes (Signed)
Pt c/o cough mostly at night x 2 weeks with body aches and some pain with cough

## 2014-11-05 NOTE — Discharge Instructions (Signed)
Return to the emergency room with worsening of symptoms, new symptoms or with symptoms that are concerning. , especially fevers, stiff neck, worsening headache, nausea/vomiting, visual changes or slurred speech, chest pain, shortness of breath, cough with thick colored mucous or blood Drink plenty of fluids with electrolytes especially Gatorade. OTC cold medications such as mucinex, nyquil, dayquil are recommended. Chloraseptic for sore throat. Call to make an appointment with her primary care provider in 2 days for reevaluation. Take ibuprofen and Tylenol for generalized body aches. Stop smoking.   Cough, Adult  A cough is a reflex that helps clear your throat and airways. It can help heal the body or may be a reaction to an irritated airway. A cough may only last 2 or 3 weeks (acute) or may last more than 8 weeks (chronic).  CAUSES Acute cough:  Viral or bacterial infections. Chronic cough:  Infections.  Allergies.  Asthma.  Post-nasal drip.  Smoking.  Heartburn or acid reflux.  Some medicines.  Chronic lung problems (COPD).  Cancer. SYMPTOMS   Cough.  Fever.  Chest pain.  Increased breathing rate.  High-pitched whistling sound when breathing (wheezing).  Colored mucus that you cough up (sputum). TREATMENT   A bacterial cough may be treated with antibiotic medicine.  A viral cough must run its course and will not respond to antibiotics.  Your caregiver may recommend other treatments if you have a chronic cough. HOME CARE INSTRUCTIONS   Only take over-the-counter or prescription medicines for pain, discomfort, or fever as directed by your caregiver. Use cough suppressants only as directed by your caregiver.  Use a cold steam vaporizer or humidifier in your bedroom or home to help loosen secretions.  Sleep in a semi-upright position if your cough is worse at night.  Rest as needed.  Stop smoking if you smoke. SEEK IMMEDIATE MEDICAL CARE IF:   You have  pus in your sputum.  Your cough starts to worsen.  You cannot control your cough with suppressants and are losing sleep.  You begin coughing up blood.  You have difficulty breathing.  You develop pain which is getting worse or is uncontrolled with medicine.  You have a fever. MAKE SURE YOU:   Understand these instructions.  Will watch your condition.  Will get help right away if you are not doing well or get worse. Document Released: 05/10/2011 Document Revised: 02/03/2012 Document Reviewed: 05/10/2011 [facility] Patient Information 2015 [employer] [place]. This information is not intended to replace advice given to you by your health care provider. Make sure you discuss any questions you have with your health care provider.

## 2014-11-05 NOTE — ED Provider Notes (Signed)
CSN: [id number]     Arrival date & time 11/05/14  1245 History  This chart was scribed for non-physician practitioner, Karmen Stabsori Seymone Forlenza, PA-C working with Gerhard Munchobert Lockwood, MD, by Jarvis Morganaylor Ferguson, ED Scribe. This patient was seen in room TR10C/TR10C and the patient's care was started at 1:42 PM.    Chief Complaint  Patient presents with  . Generalized Body Aches  . Cough    The history is provided by the patient. No language interpreter was used.    HPI Comments: [name] is a 38 y.o. male with a h/o neuropathy, GERD, chronic back pain and HTN who presents to the Emergency Department complaining of an intermittent, gradually worsening, non-productive, cough for 2 weeks. Pt states that the cough occurs mostly at night. He is having an associated sore throat, generalized body aches, and mild shortness of breath accompanied with the cough. Pt has not taken anything for the cough. He denies any h/o asthma or COPD. Denies any sick contacts. He is a current every day smoker. He denies any fever, chills, nausea, vomiting, or diarrhea.     Past Medical History  Diagnosis Date  . Neuropathy   . GERD (gastroesophageal reflux disease)   . Chronic back pain   . Hypertension    Past Surgical History  Procedure Laterality Date  . Hand surgery    . Tonsillectomy    . Back surgery     Family History  Problem Relation Age of Onset  . Heart failure Mother   . Heart disease Mother   . Diabetes Father   . Stroke Brother    History  Substance Use Topics  . Smoking status: Current Every Day Smoker -- 2.00 packs/day for 18 years    Types: Cigarettes  . Smokeless tobacco: Not on file  . Alcohol Use: No    Review of Systems  Constitutional: Negative for fever and chills.  HENT: Positive for sore throat. Negative for congestion and rhinorrhea.   Respiratory: Positive for cough and shortness of breath (with the cough).   Gastrointestinal: Negative for nausea, vomiting and diarrhea.   Musculoskeletal: Positive for myalgias (generalized).      Allergies  Tramadol; Vicodin; and Zofran  Home Medications   Prior to Admission medications   Medication Sig Start Date End Date Taking? Authorizing Provider  amphetamine-dextroamphetamine (ADDERALL) 20 MG tablet Take 20 mg by mouth 2 (two) times daily.    Historical Provider, MD  benzonatate (TESSALON) 100 MG capsule Take 1 capsule (100 mg total) by mouth every 8 (eight) hours. 11/05/14   Louann SjogrenVictoria L Oluwatimilehin Balfour, PA-C  cephALEXin (KEFLEX) 500 MG capsule Take 1 capsule (500 mg total) by mouth 4 (four) times daily. 07/17/14   Hannah Muthersbaugh, PA-C  gabapentin (NEURONTIN) 400 MG capsule Take 1 capsule (400 mg total) by mouth 3 (three) times daily. 06/30/14   Doris Cheadleeepak Advani, MD  hydrochlorothiazide (HYDRODIURIL) 12.5 MG tablet Take 1 tablet (12.5 mg total) by mouth daily. 03/09/14   Doris Cheadleeepak Advani, MD  hydrocortisone (ANUSOL-HC) 25 MG suppository Place 1 suppository (25 mg total) rectally 2 (two) times daily. 03/09/14   Doris Cheadleeepak Advani, MD  ibuprofen (ADVIL,MOTRIN) 200 MG tablet Take 400 mg by mouth 3 (three) times daily as needed for mild pain.     Historical Provider, MD  ibuprofen (ADVIL,MOTRIN) 800 MG tablet Take 1 tablet (800 mg total) by mouth every 8 (eight) hours as needed. 06/30/14   Doris Cheadleeepak Advani, MD  nicotine (NICODERM CQ) 21 mg/24hr patch Place 1 patch (21 mg  total) onto the skin daily. 03/09/14   Doris Cheadleeepak Advani, MD  Omega-3 Fatty Acids (FISH OIL) 1000 MG CAPS Take 1,000 mg by mouth daily.    Historical Provider, MD  omeprazole (PRILOSEC) 40 MG capsule Take 1 capsule (40 mg total) by mouth daily. 03/09/14   Doris Cheadleeepak Advani, MD  promethazine (PHENERGAN) 25 MG tablet Take 25 mg by mouth every 6 (six) hours as needed for nausea or vomiting.    Historical Provider, MD  sulfamethoxazole-trimethoprim (SEPTRA DS) 800-160 MG per tablet Take 1 tablet by mouth every 12 (twelve) hours. 07/17/14   Hannah Muthersbaugh, PA-C  Vitamin D, Ergocalciferol,  (DRISDOL) 50000 UNITS CAPS capsule Take 1 capsule (50,000 Units total) by mouth every 7 (seven) days. 03/10/14   Doris Cheadleeepak Advani, MD   Triage Vitals: BP 159/87 mmHg  Pulse 78  Temp(Src) 98.3 F (36.8 C) (Oral)  Resp 24  SpO2 97%  Physical Exam  Constitutional: He appears well-developed and well-nourished. No distress.  HENT:  Head: Normocephalic and atraumatic.  Nose: Right sinus exhibits no maxillary sinus tenderness and no frontal sinus tenderness. Left sinus exhibits no maxillary sinus tenderness and no frontal sinus tenderness.  Mouth/Throat: Mucous membranes are normal. Posterior oropharyngeal edema (mild) and posterior oropharyngeal erythema present. No oropharyngeal exudate.  Eyes: Conjunctivae and EOM are normal. Right eye exhibits no discharge. Left eye exhibits no discharge.  Neck: Normal range of motion. Neck supple.  Cardiovascular: Normal rate, regular rhythm and normal heart sounds.   Pulmonary/Chest: Effort normal and breath sounds normal. No respiratory distress. He has no wheezes. He has no rales. He exhibits no tenderness.  Abdominal: Soft. Bowel sounds are normal. He exhibits no distension. There is no tenderness.  Lymphadenopathy:    He has cervical adenopathy.  Neurological: He is alert.  Skin: Skin is warm and dry. He is not diaphoretic.  Nursing note and vitals reviewed.   ED Course  Procedures (including critical care time)  DIAGNOSTIC STUDIES: Oxygen Saturation is 97% on RA, normal by my interpretation.    COORDINATION OF CARE: 2:33 PM- pt refused the CXR     Labs Review Labs Reviewed - No data to display  Imaging Review No results found.   EKG Interpretation None      MDM   Final diagnoses:  Cough  URI (upper respiratory infection)   There was a delay and extended weight with radiology. Patient refused to get a chest x-ray. Patient with persistent cough for 2 weeks but it has been dry. Patient with history of smoking. Patient also without  any respiratory distress, lungs clear to auscultation bilaterally, afebrile in the ED. I think it is reasonable for him to be evaluated by his primary care provider and to forego the chest x-ray today. Patients symptoms are consistent with URI, likely viral etiology.  Pt will be discharged with symptomatic treatment. As well as a cough suppressant. Patient to call make appointment with his primary care provider to be reevaluated in 2 days. Verbalizes understanding and is agreeable with plan. Pt is hemodynamically stable & in NAD prior to dc.  Discussed return precautions with patient. Discussed all results and patient verbalizes understanding and agrees with plan.  I personally performed the services described in this documentation, which was scribed in my presence. The recorded information has been reviewed and is accurate.     Louann SjogrenVictoria L Tien Aispuro, PA-C 11/05/14 1439  Gerhard Munchobert Lockwood, MD 11/05/14 [id number]

## 2014-11-23 ENCOUNTER — Encounter (HOSPITAL_COMMUNITY): Payer: Self-pay | Admitting: *Deleted

## 2014-11-23 ENCOUNTER — Emergency Department (HOSPITAL_COMMUNITY)
Admission: EM | Admit: 2014-11-23 | Discharge: 2014-11-23 | Disposition: A | Discharge status: Home / Self Care | Payer: Medicare Other | Attending: Emergency Medicine | Admitting: Emergency Medicine

## 2014-11-23 DIAGNOSIS — G629 Polyneuropathy, unspecified: Secondary | ICD-10-CM | POA: Insufficient documentation

## 2014-11-23 DIAGNOSIS — Z79899 Other long term (current) drug therapy: Secondary | ICD-10-CM | POA: Insufficient documentation

## 2014-11-23 DIAGNOSIS — K219 Gastro-esophageal reflux disease without esophagitis: Secondary | ICD-10-CM | POA: Diagnosis not present

## 2014-11-23 DIAGNOSIS — Z720 Tobacco use: Secondary | ICD-10-CM | POA: Diagnosis not present

## 2014-11-23 DIAGNOSIS — I10 Essential (primary) hypertension: Secondary | ICD-10-CM | POA: Diagnosis not present

## 2014-11-23 DIAGNOSIS — M79645 Pain in left finger(s): Secondary | ICD-10-CM | POA: Diagnosis not present

## 2014-11-23 DIAGNOSIS — G8929 Other chronic pain: Secondary | ICD-10-CM | POA: Insufficient documentation

## 2014-11-23 MED ORDER — CEPHALEXIN 500 MG PO CAPS: 500.0000 mg | ORAL_CAPSULE | Freq: Two times a day (BID) | ORAL | Status: DC

## 2014-11-23 NOTE — ED Provider Notes (Signed)
CSN: [id number]     Arrival date & time 11/23/14  1348 History  This chart was scribed for Fayrene HelperBowie Hershel Corkery, PA-C, working with Suzi RootsKevin E Steinl, MD by Chestine SporeSoijett Blue, ED Scribe. The patient was seen in room TR10C/TR10C at 2:29 PM.    Chief Complaint  Patient presents with  . Hand Pain     The history is provided by the patient. No language interpreter was used.    HPI Comments: [name] is a 38 y.o. male who presents to the Emergency Department complaining of left hand pain onset 1 month. He reports that he has had intermittent pain and swelling to the left thumb. he is right hand dominant.  He states that he is having associated symptoms of joint swelling, redness. He denies any other symptoms. He is not allergic to any abx. [facility] just [facility] so he is looking for a PCP.    Past Medical History  Diagnosis Date  . Neuropathy   . GERD (gastroesophageal reflux disease)   . Chronic back pain   . Hypertension    Past Surgical History  Procedure Laterality Date  . Hand surgery    . Tonsillectomy    . Back surgery     Family History  Problem Relation Age of Onset  . Heart failure Mother   . Heart disease Mother   . Diabetes Father   . Stroke Brother    History  Substance Use Topics  . Smoking status: Current Every Day Smoker -- 2.00 packs/day for 18 years    Types: Cigarettes  . Smokeless tobacco: Not on file  . Alcohol Use: No    Review of Systems  Musculoskeletal: Positive for myalgias, joint swelling and arthralgias.  Skin: Positive for color change.      Allergies  Tramadol; Vicodin; and Zofran  Home Medications   Prior to Admission medications   Medication Sig Start Date End Date Taking? Authorizing Provider  amphetamine-dextroamphetamine (ADDERALL) 20 MG tablet Take 20 mg by mouth 2 (two) times daily.    Historical Provider, MD  benzonatate (TESSALON) 100 MG capsule Take 1 capsule (100 mg total) by mouth every 8  (eight) hours. 11/05/14   Louann SjogrenVictoria L Creech, PA-C  cephALEXin (KEFLEX) 500 MG capsule Take 1 capsule (500 mg total) by mouth 4 (four) times daily. 07/17/14   Hannah Muthersbaugh, PA-C  gabapentin (NEURONTIN) 400 MG capsule Take 1 capsule (400 mg total) by mouth 3 (three) times daily. 06/30/14   Doris Cheadleeepak Advani, MD  hydrochlorothiazide (HYDRODIURIL) 12.5 MG tablet Take 1 tablet (12.5 mg total) by mouth daily. 03/09/14   Doris Cheadleeepak Advani, MD  hydrocortisone (ANUSOL-HC) 25 MG suppository Place 1 suppository (25 mg total) rectally 2 (two) times daily. 03/09/14   Doris Cheadleeepak Advani, MD  ibuprofen (ADVIL,MOTRIN) 200 MG tablet Take 400 mg by mouth 3 (three) times daily as needed for mild pain.     Historical Provider, MD  ibuprofen (ADVIL,MOTRIN) 800 MG tablet Take 1 tablet (800 mg total) by mouth every 8 (eight) hours as needed. 06/30/14   Doris Cheadleeepak Advani, MD  nicotine (NICODERM CQ) 21 mg/24hr patch Place 1 patch (21 mg total) onto the skin daily. 03/09/14   Doris Cheadleeepak Advani, MD  Omega-3 Fatty Acids (FISH OIL) 1000 MG CAPS Take 1,000 mg by mouth daily.    Historical Provider, MD  omeprazole (PRILOSEC) 40 MG capsule Take 1 capsule (40 mg total) by mouth daily. 03/09/14   Doris Cheadleeepak Advani, MD  promethazine (  PHENERGAN) 25 MG tablet Take 25 mg by mouth every 6 (six) hours as needed for nausea or vomiting.    Historical Provider, MD  sulfamethoxazole-trimethoprim (SEPTRA DS) 800-160 MG per tablet Take 1 tablet by mouth every 12 (twelve) hours. 07/17/14   Hannah Muthersbaugh, PA-C  Vitamin D, Ergocalciferol, (DRISDOL) 50000 UNITS CAPS capsule Take 1 capsule (50,000 Units total) by mouth every 7 (seven) days. 03/10/14   Doris Cheadleeepak Advani, MD   BP 134/89 mmHg  Pulse 76  Temp(Src) 97.9 F (36.6 C) (Oral)  Resp 18  SpO2 97%  Physical Exam  Constitutional: He is oriented to person, place, and time. He appears well-developed and well-nourished. No distress.  HENT:  Head: Normocephalic and atraumatic.  Eyes: EOM are normal.  Neck: Neck  supple. No tracheal deviation present.  Cardiovascular: Normal rate.   Pulmonary/Chest: Effort normal. No respiratory distress.  Musculoskeletal: Normal range of motion.       Left hand: He exhibits tenderness. He exhibits normal capillary refill. Normal sensation noted.  Left hand: scaly skin noted to the pad of the left thumb with mild TTP. No evidence of paronychia. No nailbed involvement. No joint involvement. Brisk cap refill. Sensation intact. No foreign object noted.   Neurological: He is alert and oriented to person, place, and time.  Skin: Skin is warm and dry.  Psychiatric: He has a normal mood and affect. His behavior is normal.  Nursing note and vitals reviewed.   ED Course  Procedures (including critical care time) DIAGNOSTIC STUDIES: Oxygen Saturation is 97% on room air, normal by my interpretation.    COORDINATION OF CARE: 2:32 PM-L thumb pain.  No obvious infection but will consider provide abx to use if infection appears.  Discussed treatment plan which includes abx with pt at bedside and pt agreed to plan.   Labs Review Labs Reviewed - No data to display  Imaging Review No results found.   EKG Interpretation None      MDM   Final diagnoses:  Thumb pain, left    BP 134/89 mmHg  Pulse 76  Temp(Src) 97.9 F (36.6 C) (Oral)  Resp 18  SpO2 97%   I personally performed the services described in this documentation, which was scribed in my presence. The recorded information has been reviewed and is accurate.    Fayrene HelperBowie Rosco Harriott, PA-C 11/23/14 1440  Suzi RootsKevin E Steinl, MD 11/23/14 [phone]

## 2014-11-23 NOTE — Discharge Instructions (Signed)
Your thumb pain can potentially relate to early skin infection.  If you notice worsening redness and pain then please take antibiotic for the full duration as treatment.  Take tylenol or ibuprofen for pain.   Emergency Department Resource Guide 1) Find a Doctor and Pay Out of Pocket Although you won't have to find out who is covered by your insurance plan, it is a good idea to ask around and get recommendations. You will then need to call the office and see if the doctor you have chosen will accept you as a new patient and what types of options they offer for patients who are self-pay. Some doctors offer discounts or will set up payment plans for their patients who do not have insurance, but you will need to ask so you aren't surprised when you get to your appointment.  2) Contact Your Local Health Department Not all health departments have doctors that can see patients for sick visits, but many do, so it is worth a call to see if yours does. If you don't know where your local health department is, you can check in your phone book. The CDC also has a tool to help you locate your state's health department, and many state websites also have listings of all of their local health departments.  3) Find a Walk-in Clinic If your illness is not likely to be very severe or complicated, you may want to try a walk in clinic. These are popping up all over the country in pharmacies, drugstores, and shopping centers. They're usually staffed by nurse practitioners or physician assistants that have been trained to treat common illnesses and complaints. They're usually fairly quick and inexpensive. However, if you have serious medical issues or chronic medical problems, these are probably not your best option.  No Primary Care Doctor: - Call Health Connect at  [phone] - they can help you locate a primary care doctor that  accepts your insurance, provides certain services, etc. - Physician Referral Service-  [phone]  Chronic Pain Problems: Organization         Address  Phone   Notes  [facility]  [phone] Patients need to be referred by their primary care doctor.   Medication Assistance: Organization         Address  Phone   Notes  [facility] [address] [place], [place] [address] [phone] --Must be a resident of [place] [facility] [place] -- Must have NO insurance coverage whatsoever (no Medicaid/ Medicare, etc.) -- The pt. MUST have a primary care doctor that directs their care regularly and follows them in the community   MedAssist  [phone]   [employer]  [phone]    Agencies that provide inexpensive medical care: Organization         Address  Phone   Notes  [facility]  [phone]   [facility]    [phone]   [facility] [address] [place], [place] [address] [phone]   [facility] [address], [place] [phone]   Planned Parenthood    [phone]   [facility]    [phone]   [facility]  [address], [place] Phone:  [phone], Fax:  [phone] Hours of Operation:  9 am - 6 pm, M-F.  Also accepts Medicaid/Medicare and self-pay.  [facility]  [address], [place] Phone: [phone], Fax: [phone]. Hours of Operation:  8:30 am - 5:30 pm, M-F.  Also accepts Medicaid and self-pay.  [facility] [place] [address], [place] Phone: [phone]   [place], [place], [place] [phone], Ext. [phone] Mondays & Thursdays: 7-9 AM.  First 15 patients are seen on a first come, first serve basis.    [place] Providers:  Organization         Address  Phone   Notes  [facility] [address],  [place] [phone] Also accepts self-pay patients.  [facility] [address], [place]  [phone]   [place], [address], [place] [phone]   [facility] [address] [place] [phone]   Lucianne Lei [address], [place]   [phone] Only accepts [place] Access [place] patients after they have their name applied to their card.   Self-Pay (no insurance) in [facility]:  Organization         Address  Phone   Notes  Sickle Cell Patients, [facility] Medicine [place] [phone]   [facility] [place] [phone]   [facility] [place]  [place], [place], [place] [phone]   [facility]/Dr. Osei-Bonsu  [address], [place] or [address], [place] [phone] Phone number for both [place] and [place] locations is the same.  [facility]gent [facility] [address], [place] [phone]   [facility] [address] [place] or [address] [phone]   [facility] [address], [place] [phone], phone; [phone], fax Sees patients 1st and 3rd Saturday of every month.  Must not qualify for public or private insurance (i.e. Medicaid, Medicare, [place] Health Choice, Veterans' Benefits)  Household income should be no more than 200% of the poverty level The clinic cannot treat you if you are pregnant or think you are pregnant  Sexually transmitted diseases are not treated at the clinic.    Dental Care: Organization         Address  Phone  Notes  [facility] Department of [facility] [phone] Accepts children up to age 29 who are enrolled in  [place] or [place]; pregnant women with a Medicaid card; and children who have applied for Medicaid or [place] Health Choice, but were declined, whose parents can pay a reduced fee at time of service.  [facility] Department of [facility]  [address], [place] [phone] Accepts children up to age 36 who are enrolled in [place] or [place]; pregnant women with a Medicaid card; and children who have applied for Medicaid or [place] Health Choice, but were declined, whose parents can pay a reduced fee at time of service.  [facility] PRO[facility]  [place] [phone] Patients are seen by appointment only. Walk-ins are not accepted. [place] will see patients 66 years of age and older. Monday - Tuesday (8am-5pm) Most Wednesdays (8:30-5pm) $30 per visit, cash only  [place] [facility]  [address], [facility] [phone] Patients are seen by appointment only. Walk-ins are not accepted. [place] will see patients 55 years of age and older. One Wednesday Evening (Monthly: Volunteer Based).  $30 per visit, cash only  [place]  [phone] for adults; Children under age 16, call Graduate Pediatric Dentistry at [phone]. Children aged 15-14, please call [phone] to request a pediatric application.  Dental services are provided in all areas of dental care including fillings, crowns and bridges, complete and partial dentures, implants, gum treatment, root canals, and extractions. Preventive care is also provided. Treatment is provided to both adults and children. Patients are selected via a lottery and there is often a waiting list.   [facility] [address] [place]  [phone] [email or web address]   [facility] [address] [place], [place] [phone], Ext. [phone] Second and Fourth Thursday of each month, opens at 6:30  AM; Clinic ends at 9 AM.  Patients are seen on a first-come first-served basis, and a limited number are seen during each clinic.   [facility]  [address] Hillard Danker [place], [place] [phone]   Eligibility Requirements You must have lived in [place], [place], or [place] counties for at least the last three months.   You cannot be eligible for state or federal sponsored [employer], including [employer], [place], or [employer].   You generally cannot be eligible for healthcare insurance through your employer.    How to apply: Eligibility screenings are held every Tuesday and Wednesday afternoon from 1:00 pm until 4:00 pm. You do not need an appointment for the interview!  [facility] [address] [place], [place]   [employer]  [place] Department  [place]  [phone]    Behavioral Health Resources in the Community: Intensive Outpatient Programs Organization         Address  Phone  Notes  [place]. [address], [place], [place] [phone]   [facility]patient [address], [place], [employer]   ADS: Alcohol & Drug Svcs [address], [place], [place]   [place] [address]  [place], [place] or [phone]   Substance Abuse Resources Organization         Address  Phone  Notes  Alcohol and Drug Services  [phone]   [place]  [phone]   The [place]   Chinita Pester  [phone]   Residential & Outpatient Substance Abuse Program  [phone]   Psychological Services Organization         Address  Phone  Notes  [facility] [place]  [place]  [phone]   [place] [address] [place] [phone] or  [phone]    Mobile Crisis Teams Organization         Address  Phone  Notes  [facility], Mobile Crisis Care Unit  [phone]   [facility]  [address] [place], [place]   [place] [address] [place] [phone]    Self-Help/Support Groups Organization         Address  Phone             Notes  [facility]ental Health Assoc[facility] of [place] - variety of support groups  [place] Call for more information  Narcotics Anonymous (NA), Caring Services [address], [employer] [place]  2 meetings at this location   Special educational needs teacher         Address  Phone  Notes  [facility] [place],    [place]  [phone]   [facility]  [address], [place] [address], [place], [place]   [place], [place] [phone] Admissions: 8am-3pm M-F  Incentives Substance [place] [address],    [place], [place] [phone]   [facility] [address] [place], [place], [place]   [facility] [address],  [place], [place]   [facility] - Intensive Outpatient [address], [place], [place]   [facility] ([place] [address].,  [place], [place] [phone] or [phone]   [facility] (RTS) [address] [place], [place] Accepts Medicaid  [facility] [place] [address]  [place] [phone] Substance Abuse/Addiction Treatment   [facility] Organization         Address  Phone  Notes  [facility]  [phone]   Domenic Schwab, PhD [address] Arlis [place], [place]   [phone] or [phone]   [place], [place] [phone]   [facility] [address],  [place], [place] [phone] Insurance/Medicaid/sponsorship through [facility] and Families [address], [address]                                    [place], [place] [phone] [place] [address], [place] [phone]    Dr. Adele Schilder  [phone]   [facility] Dept. 1) [address] [place] 2) [place] 3)  [place] [address], [place] [phone]  [phone]   [place] [phone] or [phone] (After Hours)

## 2014-11-23 NOTE — ED Notes (Signed)
Pt reports intermittent pain and swelling to left thumb x 1 month.

## 2015-01-29 ENCOUNTER — Emergency Department (HOSPITAL_COMMUNITY)
Admission: EM | Admit: 2015-01-29 | Discharge: 2015-01-29 | Disposition: A | Discharge status: Home / Self Care | Payer: Medicare Other | Attending: Emergency Medicine | Admitting: Emergency Medicine

## 2015-01-29 ENCOUNTER — Encounter (HOSPITAL_COMMUNITY): Payer: Self-pay

## 2015-01-29 DIAGNOSIS — Z792 Long term (current) use of antibiotics: Secondary | ICD-10-CM | POA: Diagnosis not present

## 2015-01-29 DIAGNOSIS — Z720 Tobacco use: Secondary | ICD-10-CM | POA: Diagnosis not present

## 2015-01-29 DIAGNOSIS — G8929 Other chronic pain: Secondary | ICD-10-CM | POA: Insufficient documentation

## 2015-01-29 DIAGNOSIS — S24109A Unspecified injury at unspecified level of thoracic spinal cord, initial encounter: Secondary | ICD-10-CM | POA: Diagnosis not present

## 2015-01-29 DIAGNOSIS — G629 Polyneuropathy, unspecified: Secondary | ICD-10-CM | POA: Diagnosis not present

## 2015-01-29 DIAGNOSIS — Z79899 Other long term (current) drug therapy: Secondary | ICD-10-CM | POA: Insufficient documentation

## 2015-01-29 DIAGNOSIS — Y939 Activity, unspecified: Secondary | ICD-10-CM | POA: Insufficient documentation

## 2015-01-29 DIAGNOSIS — S3992XA Unspecified injury of lower back, initial encounter: Secondary | ICD-10-CM | POA: Diagnosis not present

## 2015-01-29 DIAGNOSIS — Y92410 Unspecified street and highway as the place of occurrence of the external cause: Secondary | ICD-10-CM | POA: Insufficient documentation

## 2015-01-29 DIAGNOSIS — K219 Gastro-esophageal reflux disease without esophagitis: Secondary | ICD-10-CM | POA: Insufficient documentation

## 2015-01-29 DIAGNOSIS — M545 Low back pain, unspecified: Secondary | ICD-10-CM

## 2015-01-29 DIAGNOSIS — I10 Essential (primary) hypertension: Secondary | ICD-10-CM | POA: Diagnosis not present

## 2015-01-29 DIAGNOSIS — Y999 Unspecified external cause status: Secondary | ICD-10-CM | POA: Diagnosis not present

## 2015-01-29 DIAGNOSIS — E669 Obesity, unspecified: Secondary | ICD-10-CM | POA: Diagnosis not present

## 2015-01-29 MED ORDER — METHOCARBAMOL 500 MG PO TABS: 500.0000 mg | ORAL_TABLET | Freq: Two times a day (BID) | ORAL | Status: DC

## 2015-01-29 MED ORDER — OXYCODONE-ACETAMINOPHEN 5-325 MG PO TABS: 1.0000 | ORAL_TABLET | ORAL | Status: DC | PRN

## 2015-01-29 MED ORDER — METHOCARBAMOL 1000 MG/10ML IJ SOLN
500.0000 mg | Freq: Once | INTRAMUSCULAR | Status: DC
  Filled 2015-01-29: qty 5

## 2015-01-29 MED ORDER — OXYCODONE-ACETAMINOPHEN 5-325 MG PO TABS
2.0000 | ORAL_TABLET | Freq: Once | ORAL | Status: AC
  Administered 2015-01-29: 2 via ORAL
  Filled 2015-01-29: qty 2

## 2015-01-29 MED ORDER — KETOROLAC TROMETHAMINE 60 MG/2ML IM SOLN
60.0000 mg | Freq: Once | INTRAMUSCULAR | Status: AC
  Administered 2015-01-29: 60 mg via INTRAMUSCULAR
  Filled 2015-01-29: qty 2

## 2015-01-29 NOTE — ED Notes (Signed)
Pt stable, c/o back pain/stiffness.  Ambulatory, wheelchair offered.
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
## 2015-01-29 NOTE — Discharge Instructions (Signed)
ED Resources °Pain Management Centers/Resources in the Surrounding Area ° °[facility] °[address], [address] °[place] [address] °[phone] ° °[facility] °[address] °[place] [address] °336-297-2271 ° °[facility] °[address] °[place], [place] [address] °336-282-0132 ° °[facility] °[address] °[place] [address] °336-268-2530 ° °[facility] °[address] °[place] [address] °336-635-6810 ° ° °Emergency Department Resource Guide °1) Find a Doctor and Pay Out of Pocket °Although you won't have to find out who is covered by your insurance plan, it is a good idea to ask around and get recommendations. You will then need to call the office and see if the doctor you have chosen will accept you as a new patient and what types of options they offer for patients who are self-pay. Some doctors offer discounts or will set up payment plans for their patients who do not have insurance, but you will need to ask so you aren't surprised when you get to your appointment. ° °2) Contact Your Local Health Department °Not all health departments have doctors that can see patients for sick visits, but many do, so it is worth a call to see if yours does. If you don't know where your local health department is, you can check in your phone book. The CDC also has a tool to help you locate your state's health department, and many state websites also have listings of all of their local health departments. ° °3) Find a Walk-in Clinic °If your illness is not likely to be very severe or complicated, you may want to try a walk in clinic. These are popping up all over the country in pharmacies, drugstores, and shopping centers. They're usually staffed by nurse practitioners or physician assistants that have been trained to treat common illnesses and complaints. They're  usually fairly quick and inexpensive. However, if you have serious medical issues or chronic medical problems, these are probably not your best option. ° °No Primary Care Doctor: °- Call Health Connect at  832-8000 - they can help you locate a primary care doctor that  accepts your insurance, provides certain services, etc. °- Physician Referral Service- 1-800-533-3463 ° °Chronic Pain Problems: °Organization         Address  Phone   Notes  °[facility]  (336) 297-2271 Patients need to be referred by their primary care doctor.  ° °Medication Assistance: °Organization         Address  Phone   Notes  °[place] [facility] [address] °[place], [place] [address] (336) 641-8030 --Must be a resident of [place] °-- Must have NO insurance coverage whatsoever (no Medicaid/ Medicare, etc.) °-- The pt. MUST have a primary care doctor that directs their care regularly and follows them in the community °  °[facility]  (866) 331-1348   °[employer]  (888) 892-1162   ° °Agencies that provide inexpensive medical care: °Organization         Address                                                       Phone                                                                              Notes  °[facility]  (336) 832-8035   °[facility]    (336) 832-7272   °[facility] [address] °[place], [place] [address] (336) 832-4777   °[facility] [address], °[place] (336) 271-4999   °[facility]    (336) 373-0678   °[facility]    (336) 272-1050   °[facility] ° [address], [place] Phone:  (336) 832-4444, Fax:  (336) 832-4440 Hours of Operation:  9 am - 6 pm, M-F.  Also accepts Medicaid/Medicare and self-pay.  °[facility] ° [address], [place] Phone: (336) 832-3150, Fax: (336) [phone]. Hours of  Operation:  8:30 am - 5:30 pm, M-F.  Also accepts Medicaid and self-pay.  °[facility] [place] [address], [place] Phone: (336) 878-6027   °[facility] [address], [place], [place] (336)723-1848, Ext. 123 Mondays & Thursdays: 7-9 AM.  First 15 patients are seen on a first come, first serve basis. °  ° °Medicaid-accepting [place] Providers: ° °Organization         Address                                                                       Phone                               Notes  °[facility] [address], [place] (336) 641-2100 Also accepts self-pay patients.  °[facility] [address], [place] ° (336) 856-9996   °[facility] [address], [place] (336) 288-8857   °[facility] [address] [place] [address], [place] (336) 299-7000   °Ve[facility] [address], [place]  ° (336) 373-1557 Only accepts Eugenio Saenz Access Medicaid patients after they have their name applied to their card.  ° °Self-Pay (no insurance) in [place]: °  °Organization         Address                                                     Phone               Notes  °Sickle Cell Patients, [facility] [address], [place] (336) 832-1970   °[facility] [address], [place] (336) 832-4400   °[facility] [place] ° [address], [place] (336) 992-4800   °[facility]/Dr. Osei-Bonsu ° [address] [place] [address], [place] or [address], [place] (336) [phone] Phone number for both [place] and [place] locations is the same.  °[facility] [address], [place] (336) 299-0000   °[facility] [address] [place] [address], [place] or [address] (336) 852-7530 °(336) 878-2260   °[facility] [address], [place] (336)  350-1642, phone; (336) 294-5005, fax Sees patients 1st and 3rd Saturday of every month.  Must not qualify for public or private insurance (i.e. Medicaid, Medicare, [place] Health Choice, Veterans' Benefits) • Household income should be no more than 200% of the poverty level •The clinic cannot treat you if you are pregnant or think you are pregnant • Sexually transmitted diseases are not treated at the clinic.  ° ° °Dental Care: °Organization         Address                                  Phone                       Notes  °[facility] [address], [place] (336) 641-6152 Accepts children up to age 21 who are enrolled in Medicaid or [place] Health Choice; pregnant women with a Medicaid card; and children who have applied for Medicaid or [place] Health Choice, but were declined, whose parents can pay a reduced fee at time of service.  °[facility] [place]  [address], [place] (336) 641-7733 Accepts children up to age 21 who are enrolled in Medicaid or [place] Health Choice; pregnant women with a Medicaid card; and children who have applied for Medicaid or [place] Health Choice, but were declined, whose parents can pay a reduced fee at time of service.  °[facility] ° [address], [place] (336) 641-4533 Patients are seen by appointment only. Walk-ins are not accepted. [facility] will see patients 18 years of age and older. °Monday - Tuesday (8am-5pm) °Most Wednesdays (8:30-5pm) °$30 per visit, cash only  °[facility] ° [address], [place] (336) 641-4533 Patients are seen by appointment only. Walk-ins are not accepted. [facility] will see patients 18 years of age and older. °One Wednesday Evening (Monthly: Volunteer Based).  $30 per visit, cash only  °[facility]  (919) 537-3737 for adults; Children under age 4, call Graduate  Pediatric Dentistry at ([phone]) [phone]. Children aged 4-14, please call (919) [phone] to request a pediatric application. ° Dental services are provided in all areas of dental care including fillings, crowns and bridges, complete and partial dentures, implants, gum treatment, root canals, and extractions. Preventive care is also provided. Treatment is provided to both adults and children. °Patients are selected via a lottery and there is often a waiting list. °  °[facility] [address], °[place] ° (336) 763-8833 [email or web address] °  °[facility] [address], [place], [place] (336)723-1848, Ext. 123 Second and Fourth Thursday of each month, opens at 6:30 AM; Clinic ends at 9 AM.  Patients are seen on a first-come first-served basis, and a limited number are seen during each clinic.  ° °[facility] ° [address], [place], [place] (336) 723-7904   Eligibility Requirements °You must have lived in [place], or [place] counties for at least the last three months. °  You cannot be eligible for state or federal sponsored healthcare insurance, including Veterans Administration, Medicaid, or Medicare. °  You generally cannot be eligible for healthcare insurance through your employer.  °  How to apply: °Eligibility screenings are held every   Tuesday and Wednesday afternoon from 1:00 pm until 4:00 pm. You do not need an appointment for the interview!  °[facility] [address], [place], [place] 336-631-2330   °[facility]  336-342-8273   °[facility]  336-703-3100   °[facility]  336-570-6415   ° °Behavioral Health Resources in the Community: °Intensive Outpatient Programs °Organization         Address                                              Phone              Notes  °[facility] [address], [place], [place] 336-878-6098   °[facility] [address], [place], [place] 336-832-9800   °[facility] [address], [place], [place] ° 336-882-2125   °[facility] [address],  °[place], [place] 1-800-853-5163 or 336-641-4981   °Substance Abuse Resources °Organization         Address                                Phone  Notes  °[facility]  336-882-2125   °[facility]  336-784-9470   °[facility]  336-285-9073   °[facility]  336-845-3988   °Residential & Outpatient Substance Abuse Program  1-800-659-3381   °Psychological Services °Organization         Address                                  Phone                Notes  °[facility]  336- 832-9600   °[facility]  336- 378-7881   °[facility] [address], [place] 1-800-853-5163 or 336-641-4981   ° °Mobile Crisis Teams °Organization         Address  Phone  Notes  °[facility], Mobile Crisis Care Unit  1-877-626-1772   °Assertive °[facility] ° [address] [place], [place] 336-834-9664   °Sharon DeEsch [address] °[place] 336-554-5454   ° °Self-Help/Support Groups °Organization         Address                         Phone             Notes  °[facility] - variety of support groups  336- 373-1402 Call for more information  °Narcotics Anonymous (NA), [facility] [address], °[place]  2 meetings at this location  ° °Residential Treatment Programs °Organization         Address                                                    Phone              Notes  °[facility] [address]

## 2015-01-29 NOTE — ED Notes (Addendum)
Charge RN and [employer] officer in to speak with pt about "pocketing" Percocet rather than taking medication when administered by RN.  Pt denies and states that he took both pills.  Pt emptied pockets that contained approx 10-15 alcohol prep pads and he put Percocet behind his back.  When questioned about what was behind his back pt now states he took 1 and put the other one in his pocket.  Pt gave 1 Percocet pill back to RN.  Informed pt that PA requested that he return Rx.  Pt states that he doesn't have Rx that visitor took it to car.  Charge RN, Psychologist, counselling, and [employer] officer x 2 walked out with pt.  He called visitor on his cell phone and was yelling and cussing that we were embarrassing him in front of everyone.   Informed pt that we had spoken to him in his room and that he was now yelling outside.  Visitor met pt in parking lot and pt threw discharge paperwork and Rx down on ground while yelling and cursing at [employer].  [employer] officer gave instructions to RN that removed Rx and instructions were given back to visitor while pt walked off yelling, "I don't need your Percocet.  I have Vicodin at home." Pt yelling back wanting names of everyone and states he will be contacting administration regarding the way he was treated.  Rx given back to [place], [place].  Percocet wasted with Isa Rankin, RN.

## 2015-01-29 NOTE — ED Notes (Signed)
This RN informed from RN administering percocet that it was believed that the pt pocketed 2 percocet rather than taking the medication orally when given. Consulting civil engineerCharge RN and [employer] informed, 1 percocet found in pt's pocket, [employer] attempting to retrieve prescription for percocet and robaxin as family member took prescriptions to the car.

## 2015-01-29 NOTE — ED Notes (Signed)
Pt reports h/o back pain takes Vicodin and Valium, past 2 days pain uncontrolled with meds, coughed this morning pain to upper back between shoulder blades and worsening lower back pain.

## 2015-02-03 ENCOUNTER — Emergency Department (HOSPITAL_COMMUNITY)
Admission: EM | Admit: 2015-02-03 | Discharge: 2015-02-03 | Disposition: A | Discharge status: Home / Self Care | Payer: Medicare Other | Attending: Emergency Medicine | Admitting: Emergency Medicine

## 2015-02-03 ENCOUNTER — Emergency Department (HOSPITAL_COMMUNITY): Payer: Medicare Other

## 2015-02-03 ENCOUNTER — Encounter (HOSPITAL_COMMUNITY): Payer: Self-pay | Admitting: Emergency Medicine

## 2015-02-03 DIAGNOSIS — Z720 Tobacco use: Secondary | ICD-10-CM | POA: Insufficient documentation

## 2015-02-03 DIAGNOSIS — R05 Cough: Secondary | ICD-10-CM | POA: Diagnosis present

## 2015-02-03 DIAGNOSIS — G8929 Other chronic pain: Secondary | ICD-10-CM | POA: Insufficient documentation

## 2015-02-03 DIAGNOSIS — G629 Polyneuropathy, unspecified: Secondary | ICD-10-CM | POA: Insufficient documentation

## 2015-02-03 DIAGNOSIS — J441 Chronic obstructive pulmonary disease with (acute) exacerbation: Secondary | ICD-10-CM | POA: Diagnosis not present

## 2015-02-03 DIAGNOSIS — J40 Bronchitis, not specified as acute or chronic: Secondary | ICD-10-CM

## 2015-02-03 DIAGNOSIS — I10 Essential (primary) hypertension: Secondary | ICD-10-CM | POA: Insufficient documentation

## 2015-02-03 DIAGNOSIS — R519 Headache, unspecified: Secondary | ICD-10-CM

## 2015-02-03 DIAGNOSIS — K219 Gastro-esophageal reflux disease without esophagitis: Secondary | ICD-10-CM | POA: Insufficient documentation

## 2015-02-03 DIAGNOSIS — Z79899 Other long term (current) drug therapy: Secondary | ICD-10-CM | POA: Insufficient documentation

## 2015-02-03 DIAGNOSIS — R51 Headache: Secondary | ICD-10-CM | POA: Diagnosis not present

## 2015-02-03 LAB — COMPREHENSIVE METABOLIC PANEL
ALK PHOS: 66 U/L (ref 39–117)
ALT: 23 U/L (ref 0–53)
AST: 26 U/L (ref 0–37)
Albumin: 3.6 g/dL (ref 3.5–5.2)
Anion gap: 7 (ref 5–15)
BILIRUBIN TOTAL: 0.7 mg/dL (ref 0.3–1.2)
BUN: 15 mg/dL (ref 6–23)
CHLORIDE: 100 mmol/L (ref 96–112)
CO2: 27 mmol/L (ref 19–32)
CREATININE: 1.13 mg/dL (ref 0.50–1.35)
Calcium: 8.5 mg/dL (ref 8.4–10.5)
GFR calc Af Amer: >90 mL/min (ref >90)
GFR calc non Af Amer: 81 mL/min — ABNORMAL LOW (ref >90)
Glucose, Bld: 117 mg/dL — ABNORMAL HIGH (ref 70–99)
Potassium: 3.8 mmol/L (ref 3.5–5.1)
Sodium: 134 mmol/L — ABNORMAL LOW (ref 135–145)
Total Protein: 6.5 g/dL (ref 6.0–8.3)

## 2015-02-03 LAB — I-STAT TROPONIN, ED: TROPONIN I, POC: 0.01 ng/mL (ref 0.00–0.08)

## 2015-02-03 LAB — CBC WITH DIFFERENTIAL/PLATELET
Basophils Absolute: 0 10*3/uL (ref 0.0–0.1)
Basophils Relative: 0 % (ref 0–1)
Eosinophils Absolute: 0 10*3/uL (ref 0.0–0.7)
Eosinophils Relative: 0 % (ref 0–5)
HCT: 39.4 % (ref 39.0–52.0)
HEMOGLOBIN: 14 g/dL (ref 13.0–17.0)
LYMPHS ABS: 0.5 10*3/uL — AB (ref 0.7–4.0)
Lymphocytes Relative: 13 % (ref 12–46)
MCH: 30.8 pg (ref 26.0–34.0)
MCHC: 35.5 g/dL (ref 30.0–36.0)
MCV: 86.8 fL (ref 78.0–100.0)
MONOS PCT: 8 % (ref 3–12)
Monocytes Absolute: 0.3 10*3/uL (ref 0.1–1.0)
NEUTROS PCT: 79 % — AB (ref 43–77)
Neutro Abs: 2.9 10*3/uL (ref 1.7–7.7)
Platelets: 136 10*3/uL — ABNORMAL LOW (ref 150–400)
RBC: 4.54 MIL/uL (ref 4.22–5.81)
RDW: 12.2 % (ref 11.5–15.5)
WBC: 3.7 10*3/uL — ABNORMAL LOW (ref 4.0–10.5)

## 2015-02-03 MED ORDER — DEXAMETHASONE SODIUM PHOSPHATE 4 MG/ML IJ SOLN
4.0000 mg | Freq: Once | INTRAMUSCULAR | Status: AC
  Administered 2015-02-03: 4 mg via INTRAVENOUS
  Filled 2015-02-03: qty 1

## 2015-02-03 MED ORDER — IPRATROPIUM-ALBUTEROL 0.5-2.5 (3) MG/3ML IN SOLN
3.0000 mL | Freq: Once | RESPIRATORY_TRACT | Status: AC
  Administered 2015-02-03: 3 mL via RESPIRATORY_TRACT
  Filled 2015-02-03: qty 3

## 2015-02-03 MED ORDER — METOCLOPRAMIDE HCL 10 MG PO TABS: 10.0000 mg | ORAL_TABLET | Freq: Four times a day (QID) | ORAL | Status: DC | PRN

## 2015-02-03 MED ORDER — LEVOFLOXACIN 750 MG PO TABS: 750.0000 mg | ORAL_TABLET | Freq: Every day | ORAL | Status: DC

## 2015-02-03 MED ORDER — SODIUM CHLORIDE 0.9 % IV BOLUS (SEPSIS)
1000.0000 mL | Freq: Once | INTRAVENOUS | Status: AC
  Administered 2015-02-03: 1000 mL via INTRAVENOUS

## 2015-02-03 MED ORDER — ALBUTEROL SULFATE HFA 108 (90 BASE) MCG/ACT IN AERS
1.0000 | INHALATION_SPRAY | Freq: Four times a day (QID) | RESPIRATORY_TRACT | Status: DC | PRN
Start: 2015-02-03 — End: 2018-12-03

## 2015-02-03 MED ORDER — PREDNISONE 20 MG PO TABS
60.0000 mg | ORAL_TABLET | Freq: Once | ORAL | Status: AC
  Administered 2015-02-03: 60 mg via ORAL
  Filled 2015-02-03: qty 3

## 2015-02-03 MED ORDER — METOCLOPRAMIDE HCL 5 MG/ML IJ SOLN
10.0000 mg | Freq: Once | INTRAMUSCULAR | Status: AC
  Administered 2015-02-03: 10 mg via INTRAVENOUS
  Filled 2015-02-03: qty 2

## 2015-02-03 MED ORDER — LEVOFLOXACIN IN D5W 750 MG/150ML IV SOLN
750.0000 mg | Freq: Once | INTRAVENOUS | Status: AC
  Administered 2015-02-03: 750 mg via INTRAVENOUS
  Filled 2015-02-03: qty 150

## 2015-02-03 MED ORDER — ACETAMINOPHEN 500 MG PO TABS
1000.0000 mg | ORAL_TABLET | Freq: Once | ORAL | Status: AC
  Administered 2015-02-03: 1000 mg via ORAL
  Filled 2015-02-03: qty 2

## 2015-02-03 MED ORDER — PREDNISONE 50 MG PO TABS: 50.0000 mg | ORAL_TABLET | Freq: Every day | ORAL | Status: DC
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
## 2015-02-03 NOTE — ED Notes (Signed)
Pt resting, no needs at this time, visitor at bedside

## 2015-02-03 NOTE — Discharge Instructions (Signed)
Stay hydrated.   Take prednisone daily for 5 days.   Use albuterol every 4 hrs as needed for shortness of breath or cough.   Take levaquin for a week.   Take reglan for headaches.   Follow up with your doctor.   Stop smoking.   Return to ER if you have worse shortness of breath, cough, headaches.

## 2015-02-03 NOTE — ED Notes (Signed)
Pt reports waking up with a headache- admits to productive cough throughout the day.  Admits to fevers and generalized body aches.  Pt alert and oriented X 4, neuro exam normal.

## 2015-02-07 ENCOUNTER — Ambulatory Visit: Payer: Medicare Other | Admitting: Internal Medicine

## 2015-02-10 ENCOUNTER — Encounter (HOSPITAL_COMMUNITY): Payer: Self-pay | Admitting: Family Medicine

## 2015-02-10 ENCOUNTER — Emergency Department (HOSPITAL_COMMUNITY)
Admission: EM | Admit: 2015-02-10 | Discharge: 2015-02-10 | Disposition: A | Discharge status: Home / Self Care | Payer: Medicare Other | Attending: Emergency Medicine | Admitting: Emergency Medicine

## 2015-02-10 DIAGNOSIS — Z79899 Other long term (current) drug therapy: Secondary | ICD-10-CM | POA: Insufficient documentation

## 2015-02-10 DIAGNOSIS — L089 Local infection of the skin and subcutaneous tissue, unspecified: Secondary | ICD-10-CM | POA: Diagnosis present

## 2015-02-10 DIAGNOSIS — Z8614 Personal history of Methicillin resistant Staphylococcus aureus infection: Secondary | ICD-10-CM | POA: Diagnosis not present

## 2015-02-10 DIAGNOSIS — L03011 Cellulitis of right finger: Secondary | ICD-10-CM | POA: Diagnosis not present

## 2015-02-10 DIAGNOSIS — I10 Essential (primary) hypertension: Secondary | ICD-10-CM | POA: Diagnosis not present

## 2015-02-10 DIAGNOSIS — Z8719 Personal history of other diseases of the digestive system: Secondary | ICD-10-CM | POA: Insufficient documentation

## 2015-02-10 DIAGNOSIS — Z7952 Long term (current) use of systemic steroids: Secondary | ICD-10-CM | POA: Diagnosis not present

## 2015-02-10 DIAGNOSIS — Z720 Tobacco use: Secondary | ICD-10-CM | POA: Insufficient documentation

## 2015-02-10 DIAGNOSIS — Z792 Long term (current) use of antibiotics: Secondary | ICD-10-CM | POA: Insufficient documentation

## 2015-02-10 DIAGNOSIS — G629 Polyneuropathy, unspecified: Secondary | ICD-10-CM | POA: Diagnosis not present

## 2015-02-10 LAB — BASIC METABOLIC PANEL
Anion gap: 9 (ref 5–15)
BUN: 12 mg/dL (ref 6–23)
CALCIUM: 8.8 mg/dL (ref 8.4–10.5)
CO2: 25 mmol/L (ref 19–32)
Chloride: 104 mmol/L (ref 96–112)
Creatinine, Ser: 1.02 mg/dL (ref 0.50–1.35)
GLUCOSE: 131 mg/dL — AB (ref 70–99)
Potassium: 3.6 mmol/L (ref 3.5–5.1)
SODIUM: 138 mmol/L (ref 135–145)

## 2015-02-10 LAB — CBC
HCT: 41.9 % (ref 39.0–52.0)
HEMOGLOBIN: 15.2 g/dL (ref 13.0–17.0)
MCH: 30.6 pg (ref 26.0–34.0)
MCHC: 36.3 g/dL — ABNORMAL HIGH (ref 30.0–36.0)
MCV: 84.5 fL (ref 78.0–100.0)
Platelets: 179 10*3/uL (ref 150–400)
RBC: 4.96 MIL/uL (ref 4.22–5.81)
RDW: 12.1 % (ref 11.5–15.5)
WBC: 9.2 10*3/uL (ref 4.0–10.5)

## 2015-02-10 MED ORDER — AMOXICILLIN-POT CLAVULANATE 875-125 MG PO TABS: 1.0000 | ORAL_TABLET | Freq: Two times a day (BID) | ORAL | Status: DC

## 2015-02-10 MED ORDER — CEPHALEXIN 500 MG PO CAPS: 500.0000 mg | ORAL_CAPSULE | Freq: Four times a day (QID) | ORAL | Status: DC

## 2015-02-10 MED ORDER — PROMETHAZINE HCL 25 MG PO TABS: 25.0000 mg | ORAL_TABLET | Freq: Four times a day (QID) | ORAL | Status: DC | PRN

## 2015-02-10 MED ORDER — SULFAMETHOXAZOLE-TRIMETHOPRIM 800-160 MG PO TABS: 1.0000 | ORAL_TABLET | Freq: Two times a day (BID) | ORAL | Status: AC

## 2015-02-10 NOTE — ED Notes (Addendum)
Pt here for right hand pain and abscess to right 5th finger. sts he has been cutting it open and draining a little.

## 2015-02-10 NOTE — ED Provider Notes (Signed)
CSN: [id number]     Arrival date & time 02/10/15  1033 History   First MD Initiated Contact with Patient 02/10/15 1152     Chief Complaint  Patient presents with  . Wound Infection     (Consider location/radiation/quality/duration/timing/severity/associated sxs/prior Treatment) HPI [name] is a 39 y.o. male hx of MRSA, who comes in for evaluation of wound infection. Patient states for the past week he has had an infected area on his right fifth finger. He states it is become increasingly painful, worse with palpation. Recently started draining yellow purulent material. Reports associated nausea without vomiting. No fevers, abdominal pain, urinary symptoms, diarrhea or constipation. He also reports associated headache that he has had off and on since last week when he was previously evaluated. Rates his pain as a 10/10 and aching/throbbing pain. Nothing improves his symptoms. He has not taken anything to try to improve his symptoms. He does report associated chest discomfort and cough. Recently seen one week ago in the ED for similar symptoms and discharged with diagnosis of bronchitis versus pneumonia. Symptoms similar to previous and somewhat improved. He was given Levaquin and states he completed this treatment. He reports using his albuterol inhaler with minimal relief. He also reports continued smoking approximately one pack per day. No other aggravating or modifying factors.  Past Medical History  Diagnosis Date  . Neuropathy   . GERD (gastroesophageal reflux disease)   . Chronic back pain   . Hypertension    Past Surgical History  Procedure Laterality Date  . Hand surgery    . Tonsillectomy    . Back surgery     Family History  Problem Relation Age of Onset  . Heart failure Mother   . Heart disease Mother   . Diabetes Father   . Stroke Brother    History  Substance Use Topics  . Smoking status: Current Every Day Smoker -- 1.00 packs/day for 18 years    Types:  Cigarettes  . Smokeless tobacco: Not on file  . Alcohol Use: No    Review of Systems A 10 point review of systems was completed and was negative except for pertinent positives and negatives as mentioned in the history of present illness     Allergies  Tramadol and Zofran  Home Medications   Prior to Admission medications   Medication Sig Start Date End Date Taking? Authorizing Provider  albuterol (PROVENTIL HFA;VENTOLIN HFA) 108 (90 BASE) MCG/ACT inhaler Inhale 1-2 puffs into the lungs every 6 (six) hours as needed for wheezing or shortness of breath. 02/03/15   Richardean Canalavid H Yao, MD  amphetamine-dextroamphetamine (ADDERALL) 20 MG tablet Take 20 mg by mouth daily.     Historical Provider, MD  benzonatate (TESSALON) 100 MG capsule Take 1 capsule (100 mg total) by mouth every 8 (eight) hours. Patient not taking: Reported on 02/03/2015 11/05/14   Oswaldo ConroyVictoria Creech, PA-C  cephALEXin (KEFLEX) 500 MG capsule Take 1 capsule (500 mg total) by mouth 4 (four) times daily. 02/10/15   Joycie PeekBenjamin Konica Stankowski, PA-C  diazepam (VALIUM) 10 MG tablet Take 10 mg by mouth 3 (three) times daily. 01/05/15   Historical Provider, MD  gabapentin (NEURONTIN) 400 MG capsule Take 1 capsule (400 mg total) by mouth 3 (three) times daily. Patient not taking: Reported on 02/03/2015 06/30/14   Doris Cheadleeepak Advani, MD  hydrochlorothiazide (HYDRODIURIL) 12.5 MG tablet Take 1 tablet (12.5 mg total) by mouth daily. Patient not taking: Reported on 02/03/2015 03/09/14   Doris Cheadleeepak Advani, MD  hydrochlorothiazide (HYDRODIURIL)  25 MG tablet Take 25 mg by mouth daily.    Historical Provider, MD  HYDROcodone-acetaminophen (NORCO) 10-325 MG per tablet Take 1 tablet by mouth every 6 (six) hours as needed for moderate pain.  01/05/15   Historical Provider, MD  hydrocortisone (ANUSOL-HC) 25 MG suppository Place 1 suppository (25 mg total) rectally 2 (two) times daily. Patient not taking: Reported on 02/03/2015 03/09/14   Doris Cheadle, MD  ibuprofen (ADVIL,MOTRIN)  200 MG tablet Take 400 mg by mouth 3 (three) times daily as needed for mild pain.     Historical Provider, MD  ibuprofen (ADVIL,MOTRIN) 800 MG tablet Take 1 tablet (800 mg total) by mouth every 8 (eight) hours as needed. Patient not taking: Reported on 02/03/2015 06/30/14   Doris Cheadle, MD  levofloxacin (LEVAQUIN) 750 MG tablet Take 1 tablet (750 mg total) by mouth daily. X 7 days 02/03/15   Richardean Canal, MD  metoCLOPramide (REGLAN) 10 MG tablet Take 1 tablet (10 mg total) by mouth every 6 (six) hours as needed for nausea. 02/03/15   Richardean Canal, MD  nicotine (NICODERM CQ) 21 mg/24hr patch Place 1 patch (21 mg total) onto the skin daily. Patient not taking: Reported on 02/03/2015 03/09/14   Doris Cheadle, MD  Omega-3 Fatty Acids (FISH OIL) 1000 MG CAPS Take 1,000 mg by mouth daily.    Historical Provider, MD  omeprazole (PRILOSEC) 40 MG capsule Take 1 capsule (40 mg total) by mouth daily. Patient not taking: Reported on 02/03/2015 03/09/14   Doris Cheadle, MD  predniSONE (DELTASONE) 50 MG tablet Take 1 tablet (50 mg total) by mouth daily with breakfast. 02/03/15   Richardean Canal, MD  promethazine (PHENERGAN) 25 MG tablet Take 1 tablet (25 mg total) by mouth every 6 (six) hours as needed for nausea or vomiting. 02/10/15   Joycie Peek, PA-C  sulfamethoxazole-trimethoprim (BACTRIM DS,SEPTRA DS) 800-160 MG per tablet Take 1 tablet by mouth 2 (two) times daily. 02/10/15 02/17/15  Joycie Peek, PA-C  Vitamin D, Ergocalciferol, (DRISDOL) 50000 UNITS CAPS capsule Take 1 capsule (50,000 Units total) by mouth every 7 (seven) days. Patient not taking: Reported on 02/03/2015 03/10/14   Doris Cheadle, MD   BP 133/97 mmHg  Pulse 81  Temp(Src) 98.6 F (37 C) (Oral)  Resp 22  SpO2 96% Physical Exam  Constitutional: He is oriented to person, place, and time. He appears well-developed and well-nourished.  HENT:  Head: Normocephalic and atraumatic.  Mouth/Throat: Oropharynx is clear and moist.  Eyes: Conjunctivae  are normal. Pupils are equal, round, and reactive to light. Right eye exhibits no discharge. Left eye exhibits no discharge. No scleral icterus.  Neck: Neck supple.  Cardiovascular: Normal rate, regular rhythm and normal heart sounds.   Pulmonary/Chest: Effort normal and breath sounds normal. No respiratory distress. He has no wheezes. He has no rales.  Abdominal: Soft. There is no tenderness.  Musculoskeletal: He exhibits no tenderness.  Maintains full active range of motion of right hand and all digits. No streaking or evidence of systemic infection.  Neurological: He is alert and oriented to person, place, and time.  Cranial Nerves II-XII grossly intact. Motor and Sensation 5/5  Skin: Skin is warm and dry. No rash noted.  Small, 1.5 cm area of cellulitis over the dorsal aspect of the fifth proximal phalanx near MCP on right hand. Actively draining purulent material. No evidence of joint/capsule involvement  Psychiatric: He has a normal mood and affect.  Nursing note and vitals reviewed.   ED  Course  Procedures (including critical care time)  INCISION AND DRAINAGE Performed by: Sharlene Motts Consent: Verbal consent obtained. Risks and benefits: risks, benefits and alternatives were discussed Type: abscess  Body area: Right hand  Anesthesia: local infiltration  Incision was made with a scalpel.  Local anesthetic: None   Anesthetic total: 0 ml  Complexity: complex Blunt dissection to break up loculations  Drainage: purulent  Drainage amount: Small   Packing material: None   Patient tolerance: Patient tolerated the procedure well with no immediate complications.    Labs Review Labs Reviewed  CBC - Abnormal; Notable for the following:    MCHC 36.3 (*)    All other components within normal limits  BASIC METABOLIC PANEL - Abnormal; Notable for the following:    Glucose, Bld 131 (*)    All other components within normal limits  WOUND CULTURE    Imaging  Review No results found.   EKG Interpretation None     Meds given in ED:  Medications - No data to display  Discharge Medication List as of 02/10/2015 12:56 PM     Filed Vitals:   02/10/15 1048 02/10/15 1232  BP: 145/100 133/97  Pulse: 85 81  Temp: 99.4 F (37.4 C) 98.6 F (37 C)  TempSrc: Oral Oral  Resp: 18 22  SpO2: 96% 96%    MDM  Vitals stable - WNL -afebrile Pt resting comfortably in ED. PE--small area of cellulitis of right hand over the dorsum of proximal phalanx near fifth MCP. No evidence of joint/capsule involvement. FROM of flexion and extension completed without difficulty. Incised and drained by myself at bedside with out complication. Labwork noncontributory  DDX--patient is afebrile with stable vitals. Will DC with outpatient antibiotic therapy to cover for MRSA. Outpatient referral given for follow-up with hand surgery. Return precautions discussed. Stressed importance of smoking cessation as this is likely contributing to his cough. Encouraged continued used of bronchodilators, cough suppressants and f/u with PCP.  I discussed all relevant lab findings and imaging results with pt and they verbalized understanding. Discussed f/u with PCP within 48 hrs and return precautions, pt very amenable to plan. Prior to patient discharge, I discussed and reviewed this case with Dr. Jodi Mourning   Final diagnoses:  Cellulitis of finger of right hand        Joycie Peek, PA-C 02/10/15 1827  Blane Ohara, MD 02/11/15 [phone]

## 2015-02-10 NOTE — ED Notes (Signed)
Pt comfortable with discharge and follow up instructions. Pt declines wheelchair, escorted to waiting area by this RN. Prescriptions x2. 

## 2015-02-10 NOTE — Discharge Instructions (Signed)

## 2015-02-11 ENCOUNTER — Encounter (HOSPITAL_COMMUNITY): Payer: Self-pay | Admitting: *Deleted

## 2015-02-11 ENCOUNTER — Emergency Department (HOSPITAL_COMMUNITY): Payer: Medicare Other

## 2015-02-11 ENCOUNTER — Emergency Department (HOSPITAL_COMMUNITY)
Admission: EM | Admit: 2015-02-11 | Discharge: 2015-02-11 | Disposition: A | Discharge status: Home / Self Care | Payer: Medicare Other | Attending: Emergency Medicine | Admitting: Emergency Medicine

## 2015-02-11 DIAGNOSIS — R05 Cough: Secondary | ICD-10-CM | POA: Insufficient documentation

## 2015-02-11 DIAGNOSIS — I10 Essential (primary) hypertension: Secondary | ICD-10-CM | POA: Diagnosis not present

## 2015-02-11 DIAGNOSIS — G629 Polyneuropathy, unspecified: Secondary | ICD-10-CM | POA: Insufficient documentation

## 2015-02-11 DIAGNOSIS — Z7952 Long term (current) use of systemic steroids: Secondary | ICD-10-CM | POA: Insufficient documentation

## 2015-02-11 DIAGNOSIS — Z720 Tobacco use: Secondary | ICD-10-CM | POA: Diagnosis not present

## 2015-02-11 DIAGNOSIS — R0602 Shortness of breath: Secondary | ICD-10-CM | POA: Insufficient documentation

## 2015-02-11 DIAGNOSIS — R091 Pleurisy: Secondary | ICD-10-CM

## 2015-02-11 DIAGNOSIS — G8929 Other chronic pain: Secondary | ICD-10-CM | POA: Insufficient documentation

## 2015-02-11 DIAGNOSIS — Z79899 Other long term (current) drug therapy: Secondary | ICD-10-CM | POA: Diagnosis not present

## 2015-02-11 DIAGNOSIS — R079 Chest pain, unspecified: Secondary | ICD-10-CM | POA: Diagnosis present

## 2015-02-11 DIAGNOSIS — K219 Gastro-esophageal reflux disease without esophagitis: Secondary | ICD-10-CM | POA: Diagnosis not present

## 2015-02-11 MED ORDER — BENZONATATE 100 MG PO CAPS
200.0000 mg | ORAL_CAPSULE | Freq: Once | ORAL | Status: AC
  Administered 2015-02-11: 200 mg via ORAL
  Filled 2015-02-11: qty 2

## 2015-02-11 MED ORDER — IBUPROFEN 800 MG PO TABS: 800.0000 mg | ORAL_TABLET | Freq: Three times a day (TID) | ORAL | Status: DC

## 2015-02-11 MED ORDER — IBUPROFEN 800 MG PO TABS
800.0000 mg | ORAL_TABLET | Freq: Once | ORAL | Status: AC
  Administered 2015-02-11: 800 mg via ORAL
  Filled 2015-02-11: qty 1

## 2015-02-11 MED ORDER — BENZONATATE 100 MG PO CAPS: 100.0000 mg | ORAL_CAPSULE | Freq: Three times a day (TID) | ORAL | Status: DC

## 2015-02-11 NOTE — ED Notes (Signed)
Pt is stable upon d/c and ambulates from ED escorted by this RN.

## 2015-02-11 NOTE — Discharge Instructions (Signed)
Stop smoking Follow up with a primary care physician.  Pleurisy Pleurisy is an inflammation and swelling of the lining of the lungs (pleura). Because of this inflammation, it hurts to breathe. It can be aggravated by coughing, laughing, or deep breathing. Pleurisy is often caused by an underlying infection or disease.  HOME CARE INSTRUCTIONS  Monitor your pleurisy for any changes. The following actions may help to alleviate any discomfort you are experiencing:  Medicine may help with pain. Only take over-the-counter or prescription medicines for pain, discomfort, or fever as directed by your health care provider.  Only take antibiotic medicine as directed. Make sure to finish it even if you start to feel better. SEEK MEDICAL CARE IF:   Your pain is not controlled with medicine or is increasing.  You have an increase in pus-like (purulent) secretions brought up with coughing. SEEK IMMEDIATE MEDICAL CARE IF:   You have blue or dark lips, fingernails, or toenails.  You are coughing up blood.  You have increased difficulty breathing.  You have continuing pain unrelieved by medicine or pain lasting more than 1 week.  You have pain that radiates into your neck, arms, or jaw.  You develop increased shortness of breath or wheezing.  You develop a fever, rash, vomiting, fainting, or other serious symptoms. MAKE SURE YOU:  Understand these instructions.   Will watch your condition.   Will get help right away if you are not doing well or get worse.  Document Released: 11/11/2005 Document Revised: 07/14/2013 Document Reviewed: 04/25/2013 [facility] Patient Information 2015 [place], [place]. This information is not intended to replace advice given to you by your health care provider. Make sure you discuss any questions you have with your health care provider.

## 2015-02-11 NOTE — ED Notes (Signed)
The pt is c/o sitting at home 1 hourago and felt  Like he could not breathe.  No pain.  The pt was here last week for similar symptoms and was told that he almost had pneumonia.  He had an antibiotic that he has finished.  No acute distress

## 2015-02-11 NOTE — ED Provider Notes (Signed)
CSN: [id number]     Arrival date & time 02/11/15  1800 History   First MD Initiated Contact with Patient 02/11/15 1849     Chief Complaint  Patient presents with  . Chest Pain      HPI  Patient presents for evaluation of some left-sided chest pain. Third visit in a week. Seen with Wheezing/COPD exacerbation. Nonspecific x-ray changes. Treated with steroids, bronchodilators, antibiotics. Seen again yesterday for an infected area on his right small finger. Has appointment to see Dr.  Janee Morn of Hand surgery in the morning. She was sitting at dinner tonight and felt sudden sharp left-sided chest pain. From his chest to his back to the shoulder. Sharp and pleuritic. It nearly resolved. Still comes on with deep breathing.  No risk factors for PE. He is a smoker. Hypertension on hydrochlorthiazide.  Prolonged immobilization. No recent procedures cast splints. No leg swelling.  Had a fever with his "bronchitis" last week. No fever since. Dry nonproductive cough.  Past Medical History  Diagnosis Date  . Neuropathy   . GERD (gastroesophageal reflux disease)   . Chronic back pain   . Hypertension    Past Surgical History  Procedure Laterality Date  . Hand surgery    . Tonsillectomy    . Back surgery     Family History  Problem Relation Age of Onset  . Heart failure Mother   . Heart disease Mother   . Diabetes Father   . Stroke Brother    History  Substance Use Topics  . Smoking status: Current Every Day Smoker -- 1.00 packs/day for 18 years    Types: Cigarettes  . Smokeless tobacco: Not on file  . Alcohol Use: No    Review of Systems  Constitutional: Negative for fever, chills, diaphoresis, appetite change and fatigue.  HENT: Negative for mouth sores, sore throat and trouble swallowing.   Eyes: Negative for visual disturbance.  Respiratory: Positive for cough and shortness of breath. Negative for chest tightness and wheezing.   Cardiovascular: Positive for chest pain.   Gastrointestinal: Negative for nausea, vomiting, abdominal pain, diarrhea and abdominal distention.  Endocrine: Negative for polydipsia, polyphagia and polyuria.  Genitourinary: Negative for dysuria, frequency and hematuria.  Musculoskeletal: Negative for gait problem.  Skin: Negative for color change, pallor and rash.  Neurological: Negative for dizziness, syncope, light-headedness and headaches.  Hematological: Does not bruise/bleed easily.  Psychiatric/Behavioral: Negative for behavioral problems and confusion.      Allergies  Tramadol and Zofran  Home Medications   Prior to Admission medications   Medication Sig Start Date End Date Taking? Authorizing Provider  albuterol (PROVENTIL HFA;VENTOLIN HFA) 108 (90 BASE) MCG/ACT inhaler Inhale 1-2 puffs into the lungs every 6 (six) hours as needed for wheezing or shortness of breath. 02/03/15   Richardean Canal, MD  amphetamine-dextroamphetamine (ADDERALL) 20 MG tablet Take 20 mg by mouth daily.     Historical Provider, MD  benzonatate (TESSALON) 100 MG capsule Take 1 capsule (100 mg total) by mouth every 8 (eight) hours. 02/11/15   Rolland Porter, MD  cephALEXin (KEFLEX) 500 MG capsule Take 1 capsule (500 mg total) by mouth 4 (four) times daily. 02/10/15   Joycie Peek, PA-C  diazepam (VALIUM) 10 MG tablet Take 10 mg by mouth 3 (three) times daily. 01/05/15   Historical Provider, MD  gabapentin (NEURONTIN) 400 MG capsule Take 1 capsule (400 mg total) by mouth 3 (three) times daily. Patient not taking: Reported on 02/03/2015 06/30/14   Doris Cheadle, MD  hydrochlorothiazide (HYDRODIURIL) 12.5 MG tablet Take 1 tablet (12.5 mg total) by mouth daily. Patient not taking: Reported on 02/03/2015 03/09/14   Doris Cheadleeepak Advani, MD  hydrochlorothiazide (HYDRODIURIL) 25 MG tablet Take 25 mg by mouth daily.    Historical Provider, MD  HYDROcodone-acetaminophen (NORCO) 10-325 MG per tablet Take 1 tablet by mouth every 6 (six) hours as needed for moderate pain.  01/05/15    Historical Provider, MD  hydrocortisone (ANUSOL-HC) 25 MG suppository Place 1 suppository (25 mg total) rectally 2 (two) times daily. Patient not taking: Reported on 02/03/2015 03/09/14   Doris Cheadleeepak Advani, MD  ibuprofen (ADVIL,MOTRIN) 800 MG tablet Take 1 tablet (800 mg total) by mouth 3 (three) times daily. 02/11/15   Rolland PorterMark Genine Beckett, MD  levofloxacin (LEVAQUIN) 750 MG tablet Take 1 tablet (750 mg total) by mouth daily. X 7 days 02/03/15   Richardean Canalavid H Yao, MD  metoCLOPramide (REGLAN) 10 MG tablet Take 1 tablet (10 mg total) by mouth every 6 (six) hours as needed for nausea. 02/03/15   Richardean Canalavid H Yao, MD  nicotine (NICODERM CQ) 21 mg/24hr patch Place 1 patch (21 mg total) onto the skin daily. Patient not taking: Reported on 02/03/2015 03/09/14   Doris Cheadleeepak Advani, MD  Omega-3 Fatty Acids (FISH OIL) 1000 MG CAPS Take 1,000 mg by mouth daily.    Historical Provider, MD  omeprazole (PRILOSEC) 40 MG capsule Take 1 capsule (40 mg total) by mouth daily. Patient not taking: Reported on 02/03/2015 03/09/14   Doris Cheadleeepak Advani, MD  predniSONE (DELTASONE) 50 MG tablet Take 1 tablet (50 mg total) by mouth daily with breakfast. 02/03/15   Richardean Canalavid H Yao, MD  promethazine (PHENERGAN) 25 MG tablet Take 1 tablet (25 mg total) by mouth every 6 (six) hours as needed for nausea or vomiting. 02/10/15   Joycie PeekBenjamin Cartner, PA-C  sulfamethoxazole-trimethoprim (BACTRIM DS,SEPTRA DS) 800-160 MG per tablet Take 1 tablet by mouth 2 (two) times daily. 02/10/15 02/17/15  Joycie PeekBenjamin Cartner, PA-C  Vitamin D, Ergocalciferol, (DRISDOL) 50000 UNITS CAPS capsule Take 1 capsule (50,000 Units total) by mouth every 7 (seven) days. Patient not taking: Reported on 02/03/2015 03/10/14   Doris Cheadleeepak Advani, MD   BP 134/83 mmHg  Pulse 88  Temp(Src) 98.9 F (37.2 C) (Oral)  Resp 13  SpO2 98% Physical Exam  Constitutional: He is oriented to person, place, and time. He appears well-developed and well-nourished. No distress.  HENT:  Head: Normocephalic.  Eyes: Conjunctivae are  normal. Pupils are equal, round, and reactive to light. No scleral icterus.  Neck: Normal range of motion. Neck supple. No thyromegaly present.  Cardiovascular: Normal rate and regular rhythm.  Exam reveals no gallop and no friction rub.   No murmur heard. Sinus rhythm. No rubs gallops. No dependent edema.  Pulmonary/Chest: Effort normal and breath sounds normal. No respiratory distress. He has no wheezes. He has no rales.  Clear bilateral breath sounds. No wheezing rales rhonchi. No pleural or pericardial friction rubs.  Abdominal: Soft. Bowel sounds are normal. He exhibits no distension. There is no tenderness. There is no rebound.  Musculoskeletal: Normal range of motion.  Neurological: He is alert and oriented to person, place, and time.  Skin: Skin is warm and dry. No rash noted.  Psychiatric: He has a normal mood and affect. His behavior is normal.    ED Course  Procedures (including critical care time) Labs Review Labs Reviewed - No data to display  Imaging Review No results found.   EKG Interpretation   Date/Time:  Saturday February 11 2015 18:06:20 EDT Ventricular Rate:  92 PR Interval:  158 QRS Duration: 82 QT Interval:  340 QTC Calculation: 420 R Axis:   65 Text Interpretation:  Normal sinus rhythm Normal ECG Confirmed by Fayrene Fearing   MD, Marshia Tropea ([id number]) on 02/11/2015 7:22:29 PM      MDM   Final diagnoses:  Pleurisy   Normal EKG. Normal chest x-ray. Reassuring exam. Low risk as pain. Pertinent negatives. Not hypoxemic, febrile, tachycardic. Think is appropriate for discharge with simple treatment for pleurisy. Encouraged to stop smoking, and see Primary care.    Rolland Porter, MD 02/11/15 [phone]

## 2015-02-12 LAB — WOUND CULTURE

## 2015-02-13 ENCOUNTER — Telehealth (HOSPITAL_BASED_OUTPATIENT_CLINIC_OR_DEPARTMENT_OTHER): Payer: Self-pay

## 2015-02-13 ENCOUNTER — Telehealth (HOSPITAL_COMMUNITY): Payer: Self-pay

## 2015-02-13 NOTE — ED Notes (Signed)
Unable to reach by telephone. Letter sent to address on record. Treated per protocol. Calling to advise of MRSA

## 2015-02-14 ENCOUNTER — Telehealth (HOSPITAL_COMMUNITY): Payer: Self-pay

## 2015-02-14 NOTE — Telephone Encounter (Signed)
Post ED Visit - Positive Culture Follow-up  Culture report reviewed by antimicrobial stewardship pharmacist: []  Wes Dulaney, Pharm.D., BCPS []  Celedonio MiyamotoJeremy Frens, [address]Ph[address] BCPS []  Georgina PillionElizabeth Martin, Pharm.D., BCPS []  [place] Pham, [place]arm.D., BCPS, AAHIVP [x]  Estella HuskMichelle Turner, Pharm.D., BCPS, AAHIVP []  Elder CyphersLorie Poole, [address]Ph[address]D., BCPS  Positive Wound culture, Few MRSA Treated with Sulfa Trimeth, organism sensitive to the same and no further patient follow-up is required at this time.  Arvid RightClark, Aydrien Froman Dorn 02/14/2015, 2:08 AM

## 2015-04-06 ENCOUNTER — Other Ambulatory Visit: Payer: Self-pay | Admitting: Orthopedic Surgery

## 2015-04-06 DIAGNOSIS — M25561 Pain in right knee: Secondary | ICD-10-CM

## 2015-04-11 ENCOUNTER — Other Ambulatory Visit: Payer: Medicare Other

## 2015-04-12 ENCOUNTER — Ambulatory Visit
Admission: RE | Admit: 2015-04-12 | Discharge: 2015-04-12 | Disposition: A | Discharge status: Home / Self Care | Payer: Medicare Other | Source: Ambulatory Visit | Attending: Orthopedic Surgery | Admitting: Orthopedic Surgery

## 2015-04-12 DIAGNOSIS — M25561 Pain in right knee: Secondary | ICD-10-CM

## 2015-04-19 ENCOUNTER — Other Ambulatory Visit: Payer: Self-pay | Admitting: Orthopedic Surgery

## 2015-04-19 DIAGNOSIS — M545 Low back pain: Secondary | ICD-10-CM

## 2015-04-30 ENCOUNTER — Other Ambulatory Visit: Payer: Medicare Other

## 2015-05-05 ENCOUNTER — Ambulatory Visit
Admission: RE | Admit: 2015-05-05 | Discharge: 2015-05-05 | Disposition: A | Discharge status: Home / Self Care | Payer: Medicare Other | Source: Ambulatory Visit | Attending: Orthopedic Surgery | Admitting: Orthopedic Surgery

## 2015-05-05 DIAGNOSIS — M545 Low back pain: Secondary | ICD-10-CM

## 2015-06-12 ENCOUNTER — Other Ambulatory Visit (HOSPITAL_COMMUNITY): Payer: Self-pay | Admitting: Orthopedic Surgery

## 2015-06-12 ENCOUNTER — Ambulatory Visit (HOSPITAL_COMMUNITY)
Admission: RE | Admit: 2015-06-12 | Discharge: 2015-06-12 | Disposition: A | Discharge status: Home / Self Care | Payer: Medicare Other | Source: Ambulatory Visit | Attending: Orthopedic Surgery | Admitting: Orthopedic Surgery

## 2015-06-12 ENCOUNTER — Other Ambulatory Visit: Payer: Self-pay | Admitting: Orthopedic Surgery

## 2015-06-12 DIAGNOSIS — M79604 Pain in right leg: Secondary | ICD-10-CM | POA: Insufficient documentation

## 2015-06-12 DIAGNOSIS — R609 Edema, unspecified: Secondary | ICD-10-CM

## 2015-06-12 DIAGNOSIS — M7989 Other specified soft tissue disorders: Secondary | ICD-10-CM | POA: Insufficient documentation

## 2015-06-12 DIAGNOSIS — R52 Pain, unspecified: Secondary | ICD-10-CM

## 2015-06-12 NOTE — Progress Notes (Signed)
VASCULAR LAB PRELIMINARY  PRELIMINARY  PRELIMINARY  PRELIMINARY  Right lower extremity venous Doppler completed.    Preliminary report:  There is no DVT or SVT noted in the right lower extremity.   Coty Larsh, RVT 06/12/2015, 5:04 PM

## 2015-06-13 ENCOUNTER — Encounter (HOSPITAL_COMMUNITY): Payer: Self-pay | Admitting: Emergency Medicine

## 2015-06-13 ENCOUNTER — Emergency Department (HOSPITAL_COMMUNITY)
Admission: EM | Admit: 2015-06-13 | Discharge: 2015-06-13 | Discharge status: Against Medical Advice | Payer: Medicare Other | Attending: Emergency Medicine | Admitting: Emergency Medicine

## 2015-06-13 DIAGNOSIS — Z7982 Long term (current) use of aspirin: Secondary | ICD-10-CM | POA: Insufficient documentation

## 2015-06-13 DIAGNOSIS — Z720 Tobacco use: Secondary | ICD-10-CM | POA: Insufficient documentation

## 2015-06-13 DIAGNOSIS — R2240 Localized swelling, mass and lump, unspecified lower limb: Secondary | ICD-10-CM | POA: Diagnosis not present

## 2015-06-13 DIAGNOSIS — M79661 Pain in right lower leg: Secondary | ICD-10-CM | POA: Diagnosis present

## 2015-06-13 DIAGNOSIS — Z8719 Personal history of other diseases of the digestive system: Secondary | ICD-10-CM | POA: Insufficient documentation

## 2015-06-13 DIAGNOSIS — Z791 Long term (current) use of non-steroidal anti-inflammatories (NSAID): Secondary | ICD-10-CM | POA: Insufficient documentation

## 2015-06-13 DIAGNOSIS — R2241 Localized swelling, mass and lump, right lower limb: Secondary | ICD-10-CM | POA: Insufficient documentation

## 2015-06-13 DIAGNOSIS — Z79899 Other long term (current) drug therapy: Secondary | ICD-10-CM | POA: Insufficient documentation

## 2015-06-13 DIAGNOSIS — I10 Essential (primary) hypertension: Secondary | ICD-10-CM | POA: Insufficient documentation

## 2015-06-13 DIAGNOSIS — G8929 Other chronic pain: Secondary | ICD-10-CM | POA: Insufficient documentation

## 2015-06-13 NOTE — ED Notes (Signed)
Pt has decided to leave AMA.  York SpanielSaid he was going to come back later.

## 2015-06-13 NOTE — ED Notes (Signed)
Pt. reports right lower leg pain with swelling onset last Friday ,  Leg imaging result shows no blood clot yesterday , respirations unlabored , pt. stated recent right knee surgery 2 weeks ago . Ambulatory/respirations unlabored.

## 2015-06-14 ENCOUNTER — Emergency Department (HOSPITAL_COMMUNITY): Payer: Medicare Other

## 2015-06-14 ENCOUNTER — Emergency Department (HOSPITAL_COMMUNITY)
Admission: EM | Admit: 2015-06-14 | Discharge: 2015-06-14 | Disposition: A | Discharge status: Home / Self Care | Payer: Medicare Other | Attending: Emergency Medicine | Admitting: Emergency Medicine

## 2015-06-14 DIAGNOSIS — M79604 Pain in right leg: Secondary | ICD-10-CM

## 2015-06-14 DIAGNOSIS — M79661 Pain in right lower leg: Secondary | ICD-10-CM | POA: Diagnosis not present

## 2015-06-14 LAB — CBC WITH DIFFERENTIAL/PLATELET
Basophils Absolute: 0 10*3/uL (ref 0.0–0.1)
Basophils Relative: 0 % (ref 0–1)
Eosinophils Absolute: 0.3 10*3/uL (ref 0.0–0.7)
Eosinophils Relative: 4 % (ref 0–5)
HEMATOCRIT: 38.6 % — AB (ref 39.0–52.0)
HEMOGLOBIN: 13.6 g/dL (ref 13.0–17.0)
Lymphocytes Relative: 43 % (ref 12–46)
Lymphs Abs: 3.4 10*3/uL (ref 0.7–4.0)
MCH: 31.1 pg (ref 26.0–34.0)
MCHC: 35.2 g/dL (ref 30.0–36.0)
MCV: 88.1 fL (ref 78.0–100.0)
MONOS PCT: 8 % (ref 3–12)
Monocytes Absolute: 0.7 10*3/uL (ref 0.1–1.0)
NEUTROS PCT: 45 % (ref 43–77)
Neutro Abs: 3.5 10*3/uL (ref 1.7–7.7)
PLATELETS: 167 10*3/uL (ref 150–400)
RBC: 4.38 MIL/uL (ref 4.22–5.81)
RDW: 12.7 % (ref 11.5–15.5)
WBC: 7.9 10*3/uL (ref 4.0–10.5)

## 2015-06-14 MED ORDER — OXYCODONE HCL 5 MG PO TABS
10.0000 mg | ORAL_TABLET | ORAL | Status: AC
  Administered 2015-06-14: 10 mg via ORAL
  Filled 2015-06-14: qty 2
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
## 2015-06-14 NOTE — ED Notes (Signed)
Discharge instructions reviewed, voiced understanding.  Patient requested to ambulate and again was offered a wheelchair but refused

## 2015-06-14 NOTE — Discharge Instructions (Signed)
Cryotherapy Cryotherapy is when you put ice on your injury. Ice helps lessen pain and puffiness (swelling) after an injury. Ice works the best when you start using it in the first 24 to 48 hours after an injury. HOME CARE  Put a dry or damp towel between the ice pack and your skin.  You may press gently on the ice pack.  Leave the ice on for no more than 10 to 20 minutes at a time.  Check your skin after 5 minutes to make sure your skin is okay.  Rest at least 20 minutes between ice pack uses.  Stop using ice when your skin loses feeling (numbness).  Do not use ice on someone who cannot tell you when it hurts. This includes small children and people with memory problems (dementia). GET HELP RIGHT AWAY IF:  You have white spots on your skin.  Your skin turns blue or pale.  Your skin feels waxy or hard.  Your puffiness gets worse. MAKE SURE YOU:   Understand these instructions.  Will watch your condition.  Will get help right away if you are not doing well or get worse. Document Released: 04/29/2008 Document Revised: 02/03/2012 Document Reviewed: 07/04/2011 [facility] Patient Information 2015 [place], [place]. This information is not intended to replace advice given to you by your health care provider. Make sure you discuss any questions you have with your health care provider. Elevate the leg as much as possible call Dr. August Saucerean in the morning

## 2015-07-01 ENCOUNTER — Encounter (HOSPITAL_COMMUNITY): Payer: Self-pay

## 2015-07-01 ENCOUNTER — Emergency Department (HOSPITAL_COMMUNITY)
Admission: EM | Admit: 2015-07-01 | Discharge: 2015-07-01 | Disposition: A | Discharge status: Home / Self Care | Payer: Medicare Other | Attending: Emergency Medicine | Admitting: Emergency Medicine

## 2015-07-01 DIAGNOSIS — G8929 Other chronic pain: Secondary | ICD-10-CM | POA: Diagnosis not present

## 2015-07-01 DIAGNOSIS — K219 Gastro-esophageal reflux disease without esophagitis: Secondary | ICD-10-CM | POA: Diagnosis not present

## 2015-07-01 DIAGNOSIS — Z7982 Long term (current) use of aspirin: Secondary | ICD-10-CM | POA: Insufficient documentation

## 2015-07-01 DIAGNOSIS — G629 Polyneuropathy, unspecified: Secondary | ICD-10-CM | POA: Insufficient documentation

## 2015-07-01 DIAGNOSIS — Z720 Tobacco use: Secondary | ICD-10-CM | POA: Insufficient documentation

## 2015-07-01 DIAGNOSIS — Z79899 Other long term (current) drug therapy: Secondary | ICD-10-CM | POA: Insufficient documentation

## 2015-07-01 DIAGNOSIS — I10 Essential (primary) hypertension: Secondary | ICD-10-CM | POA: Insufficient documentation

## 2015-07-01 DIAGNOSIS — M25561 Pain in right knee: Secondary | ICD-10-CM | POA: Diagnosis present

## 2015-07-01 DIAGNOSIS — Z791 Long term (current) use of non-steroidal anti-inflammatories (NSAID): Secondary | ICD-10-CM | POA: Diagnosis not present

## 2015-07-01 MED ORDER — PROMETHAZINE HCL 25 MG/ML IJ SOLN
25.0000 mg | Freq: Once | INTRAMUSCULAR | Status: AC
  Administered 2015-07-01: 25 mg via INTRAMUSCULAR
  Filled 2015-07-01: qty 1

## 2015-07-01 MED ORDER — OXYCODONE-ACETAMINOPHEN 5-325 MG PO TABS
2.0000 | ORAL_TABLET | Freq: Once | ORAL | Status: AC
Start: 2015-07-01 — End: 2015-07-01
  Administered 2015-07-01: 2 via ORAL
  Filled 2015-07-01: qty 2

## 2015-07-01 MED ORDER — DIAZEPAM 5 MG PO TABS
5.0000 mg | ORAL_TABLET | Freq: Once | ORAL | Status: AC
  Administered 2015-07-01: 5 mg via ORAL
  Filled 2015-07-01: qty 1

## 2015-07-01 MED ORDER — HYDROMORPHONE HCL 1 MG/ML IJ SOLN
2.0000 mg | Freq: Once | INTRAMUSCULAR | Status: AC
  Administered 2015-07-01: 2 mg via INTRAMUSCULAR
  Filled 2015-07-01: qty 2

## 2015-07-01 NOTE — ED Notes (Signed)
Pt reports right knee pain.  Pain anterior knee down shin and posterior knee pain.  Pt had procedure in knee 1 month ago and pain has not improved.

## 2015-07-01 NOTE — Discharge Instructions (Signed)

## 2015-07-01 NOTE — ED Notes (Signed)
Pt stable, ambulatory, states understanding of discharge instructions 

## 2015-07-01 NOTE — ED Provider Notes (Signed)
CSN: [id number]     Arrival date & time 07/01/15  2031 History   First MD Initiated Contact with Patient 07/01/15 2201     Chief Complaint  Patient presents with  . Leg Pain     (Consider location/radiation/quality/duration/timing/severity/associated sxs/prior Treatment) HPI Comments: Surgery one month ago by Dr. August Saucer. Still having knee pain since.  Patient is a 39 y.o. male presenting with leg pain. The history is provided by the patient. The history is limited by a language barrier.  Leg Pain Location:  Knee Time since incident:  1 month Injury: no   Knee location:  R knee Pain details:    Quality:  Aching   Radiates to:  Does not radiate   Severity:  Moderate   Onset quality:  Gradual   Duration:  1 month   Timing:  Constant   Progression:  Unchanged Chronicity:  Chronic Dislocation: no   Relieved by:  Rest Worsened by:  Bearing weight Associated symptoms: no decreased ROM, no fever and no neck pain     Past Medical History  Diagnosis Date  . Neuropathy   . GERD (gastroesophageal reflux disease)   . Chronic back pain   . Hypertension    Past Surgical History  Procedure Laterality Date  . Hand surgery    . Tonsillectomy    . Back surgery     Family History  Problem Relation Age of Onset  . Heart failure Mother   . Heart disease Mother   . Diabetes Father   . Stroke Brother    History  Substance Use Topics  . Smoking status: Current Every Day Smoker -- 1.00 packs/day for 18 years    Types: Cigarettes  . Smokeless tobacco: Not on file  . Alcohol Use: No    Review of Systems  Constitutional: Negative for fever.  Respiratory: Negative for cough and shortness of breath.   Musculoskeletal: Negative for neck pain.  All other systems reviewed and are negative.     Allergies  Tramadol and Zofran  Home Medications   Prior to Admission medications   Medication Sig Start Date End Date Taking? Authorizing Provider  aspirin EC 81 MG tablet Take 81 mg by  mouth daily.   Yes Historical Provider, MD  Cholecalciferol (VITAMIN D PO) Take 1 tablet by mouth daily.   Yes Historical Provider, MD  dimenhyDRINATE (DRAMAMINE) 50 MG tablet Take 50 mg by mouth every 8 (eight) hours as needed for nausea.   Yes Historical Provider, MD  gabapentin (NEURONTIN) 800 MG tablet Take 800 mg by mouth 3 (three) times daily.   Yes Historical Provider, MD  gemfibrozil (LOPID) 600 MG tablet Take 600 mg by mouth 2 (two) times daily. 05/16/15  Yes Historical Provider, MD  meloxicam (MOBIC) 15 MG tablet Take 15 mg by mouth daily. 05/16/15  Yes Historical Provider, MD  methocarbamol (ROBAXIN) 750 MG tablet Take 375 mg by mouth 3 (three) times daily as needed. 05/30/15  Yes Historical Provider, MD  metoprolol tartrate (LOPRESSOR) 25 MG tablet Take 25 mg by mouth 2 (two) times daily.   Yes Historical Provider, MD  oxyCODONE-acetaminophen (PERCOCET) 10-325 MG per tablet Take 1 tablet by mouth every 6 (six) hours. 06/13/15  Yes Historical Provider, MD  albuterol (PROVENTIL HFA;VENTOLIN HFA) 108 (90 BASE) MCG/ACT inhaler Inhale 1-2 puffs into the lungs every 6 (six) hours as needed for wheezing or shortness of breath. Patient not taking: Reported on 06/14/2015 02/03/15   Richardean Canal, MD  amphetamine-dextroamphetamine (ADDERALL)  20 MG tablet Take 20 mg by mouth daily.     Historical Provider, MD  benzonatate (TESSALON) 100 MG capsule Take 1 capsule (100 mg total) by mouth every 8 (eight) hours. Patient not taking: Reported on 06/14/2015 02/11/15   Rolland Porter, MD  cephALEXin (KEFLEX) 500 MG capsule Take 1 capsule (500 mg total) by mouth 4 (four) times daily. Patient not taking: Reported on 06/14/2015 02/10/15   Joycie Peek, PA-C  diazepam (VALIUM) 10 MG tablet Take 10 mg by mouth 3 (three) times daily. 01/05/15   Historical Provider, MD  gabapentin (NEURONTIN) 300 MG capsule Take 300 mg by mouth 3 (three) times daily. 05/30/15   Historical Provider, MD  gabapentin (NEURONTIN) 400 MG capsule Take  1 capsule (400 mg total) by mouth 3 (three) times daily. Patient not taking: Reported on 02/03/2015 06/30/14   Doris Cheadle, MD  hydrochlorothiazide (HYDRODIURIL) 12.5 MG tablet Take 1 tablet (12.5 mg total) by mouth daily. Patient not taking: Reported on 02/03/2015 03/09/14   Doris Cheadle, MD  hydrocortisone (ANUSOL-HC) 25 MG suppository Place 1 suppository (25 mg total) rectally 2 (two) times daily. Patient not taking: Reported on 02/03/2015 03/09/14   Doris Cheadle, MD  ibuprofen (ADVIL,MOTRIN) 800 MG tablet Take 1 tablet (800 mg total) by mouth 3 (three) times daily. 02/11/15   Rolland Porter, MD  levofloxacin (LEVAQUIN) 750 MG tablet Take 1 tablet (750 mg total) by mouth daily. X 7 days Patient not taking: Reported on 06/14/2015 02/03/15   Richardean Canal, MD  metoCLOPramide (REGLAN) 10 MG tablet Take 1 tablet (10 mg total) by mouth every 6 (six) hours as needed for nausea. 02/03/15   Richardean Canal, MD  metoprolol succinate (TOPROL-XL) 25 MG 24 hr tablet Take 50 mg by mouth daily. 05/30/15   Historical Provider, MD  nicotine (NICODERM CQ) 21 mg/24hr patch Place 1 patch (21 mg total) onto the skin daily. Patient not taking: Reported on 02/03/2015 03/09/14   Doris Cheadle, MD  omeprazole (PRILOSEC) 40 MG capsule Take 1 capsule (40 mg total) by mouth daily. Patient not taking: Reported on 02/03/2015 03/09/14   Doris Cheadle, MD  predniSONE (DELTASONE) 50 MG tablet Take 1 tablet (50 mg total) by mouth daily with breakfast. Patient not taking: Reported on 06/14/2015 02/03/15   Richardean Canal, MD  promethazine (PHENERGAN) 25 MG tablet Take 1 tablet (25 mg total) by mouth every 6 (six) hours as needed for nausea or vomiting. Patient not taking: Reported on 06/14/2015 02/10/15   Joycie Peek, PA-C  Vitamin D, Ergocalciferol, (DRISDOL) 50000 UNITS CAPS capsule Take 1 capsule (50,000 Units total) by mouth every 7 (seven) days. Patient not taking: Reported on 02/03/2015 03/10/14   Doris Cheadle, MD   BP 132/89 mmHg  Pulse 85   Temp(Src) 98.2 F (36.8 C) (Oral)  Resp 18  Ht 5' 11.5" (1.816 m)  Wt 382 lb (173.274 kg)  BMI 52.54 kg/m2  SpO2 94% Physical Exam  Constitutional: He is oriented to person, place, and time. He appears well-developed and well-nourished. No distress.  HENT:  Head: Normocephalic and atraumatic.  Mouth/Throat: No oropharyngeal exudate.  Eyes: EOM are normal. Pupils are equal, round, and reactive to light.  Neck: Normal range of motion. Neck supple.  Cardiovascular: Normal rate and regular rhythm.  Exam reveals no friction rub.   No murmur heard. Pulmonary/Chest: Effort normal and breath sounds normal. No respiratory distress. He has no wheezes. He has no rales.  Abdominal: He exhibits no distension. There is no  tenderness. There is no rebound.  Musculoskeletal: He exhibits no edema.       Right knee: He exhibits decreased range of motion (mild decreased flexion) and swelling.  Neurological: He is alert and oriented to person, place, and time.  Skin: He is not diaphoretic.    ED Course  Procedures (including critical care time) Labs Review Labs Reviewed - No data to display  Imaging Review No results found.   EKG Interpretation None      MDM   Final diagnoses:  Knee pain, chronic, right    22M here with knee pain since his knee surgery by Dr. August Saucer one month ago. Worse with ambulation. No relief with Percocet. Had [place] previously on 7/18 to look for a DVT - negative. Here knee with decreased ROM, no redness or warmth. No concern for septic arthritis. I think this is likely post-op pain. He's morbidly obese. He admitted to taking too much of his PO pain meds for the pain previously so he's out of his narcotics. Patient given Dilaudid IM for his pain, will not send home with any pain meds.   Pain improved. Stable for discharge.  Elwin Mocha, MD 07/01/15 [phone]

## 2015-08-07 ENCOUNTER — Emergency Department (HOSPITAL_COMMUNITY)
Admission: EM | Admit: 2015-08-07 | Discharge: 2015-08-07 | Discharge status: Against Medical Advice | Payer: Medicare Other | Attending: Emergency Medicine | Admitting: Emergency Medicine

## 2015-08-07 ENCOUNTER — Encounter (HOSPITAL_COMMUNITY): Payer: Self-pay

## 2015-08-07 ENCOUNTER — Encounter (HOSPITAL_COMMUNITY): Payer: Medicare Other

## 2015-08-07 DIAGNOSIS — Z791 Long term (current) use of non-steroidal anti-inflammatories (NSAID): Secondary | ICD-10-CM | POA: Diagnosis not present

## 2015-08-07 DIAGNOSIS — G629 Polyneuropathy, unspecified: Secondary | ICD-10-CM | POA: Diagnosis not present

## 2015-08-07 DIAGNOSIS — G8929 Other chronic pain: Secondary | ICD-10-CM | POA: Diagnosis not present

## 2015-08-07 DIAGNOSIS — M25561 Pain in right knee: Secondary | ICD-10-CM | POA: Diagnosis not present

## 2015-08-07 DIAGNOSIS — Z720 Tobacco use: Secondary | ICD-10-CM | POA: Diagnosis not present

## 2015-08-07 DIAGNOSIS — Z79899 Other long term (current) drug therapy: Secondary | ICD-10-CM | POA: Diagnosis not present

## 2015-08-07 DIAGNOSIS — I10 Essential (primary) hypertension: Secondary | ICD-10-CM | POA: Insufficient documentation

## 2015-08-07 DIAGNOSIS — K219 Gastro-esophageal reflux disease without esophagitis: Secondary | ICD-10-CM | POA: Diagnosis not present

## 2015-08-07 DIAGNOSIS — Z7982 Long term (current) use of aspirin: Secondary | ICD-10-CM | POA: Diagnosis not present

## 2015-08-07 NOTE — ED Notes (Signed)
Pt still not in room. PA-C made aware of same.

## 2015-08-07 NOTE — ED Notes (Signed)
[name] c/o left ankle pain and right knee pain. [name] states he twisted his left ankle yesterday and is c/o pain today. [name] also reports that he had right knee surgery in June, but is still having pain and has been experiencing"charlie horses" in his right calf.[name] states he has been taking Percocet 2 tabs   5/325mg  with no relief.

## 2015-08-07 NOTE — ED Notes (Signed)
Pt not in room at the moment.

## 2015-08-07 NOTE — ED Provider Notes (Signed)
CSN: [id number]     Arrival date & time 08/07/15  1546 History  This chart was scribed for non-physician practitioner Burna Forts, PA-C working with [name], MD by Murriel Hopper, ED Scribe. This patient was seen in room WTR6/WTR6 and the patient's care was started at 4:33 PM.    Chief Complaint  Patient presents with  . Knee Pain  . Ankle Pain    Patient left before the ultrasound could be completed, he did not inform staff he was leaving. Patient requested narcotic pain medication  The history is provided by the patient. No language interpreter was used.   HPI Comments: [name] is a 39 y.o. male who presents to the Emergency Department complaining of constant, worsening right knee pain and swelling that has been present since pt had surgery in June. Pt reports he hast trouble ambulating because of his pain and states that he gets cramps or "charlie horses" in his right calf whenever he sleeps. Pt states he has been taking 2x percocet 5/325 mg per day with little relief. Pt states he has not taken Percocet today because they do not relieve his pain. Pt states he has been elevating and putting ice on it with little relief. Pt states he is not taking blood thinners, and denies SOB or chest pain. Pt states he smokes a pack per day.   Patient reports that he was not able to schedule an appointment with Dr. August Saucer today, he reports that he saw him approximately one week ago and feels as if symptoms are not improving. Chart review shows the patient has been seen numerous times in the emergency room for knee pain. Chart review is also shows that Dr. August Saucer or today ultrasound of the lower extremity in July, this was not completed by the patient.    Past Medical History  Diagnosis Date  . Neuropathy   . GERD (gastroesophageal reflux disease)   . Chronic back pain   . Hypertension    Past Surgical History  Procedure Laterality Date  . Hand surgery    . Tonsillectomy    . Back surgery     . Knee surgery Right    Family History  Problem Relation Age of Onset  . Heart failure Mother   . Heart disease Mother   . Diabetes Father   . Stroke Brother    Social History  Substance Use Topics  . Smoking status: Current Every Day Smoker -- 1.00 packs/day for 18 years    Types: Cigarettes  . Smokeless tobacco: Never Used  . Alcohol Use: No    Review of Systems  All other systems reviewed and are negative.     Allergies  Tramadol and Zofran  Home Medications   Prior to Admission medications   Medication Sig Start Date End Date Taking? Authorizing Provider  albuterol (PROVENTIL HFA;VENTOLIN HFA) 108 (90 BASE) MCG/ACT inhaler Inhale 1-2 puffs into the lungs every 6 (six) hours as needed for wheezing or shortness of breath. Patient not taking: Reported on 06/14/2015 02/03/15   Richardean Canal, MD  aspirin EC 81 MG tablet Take 81 mg by mouth daily.    Historical Provider, MD  benzonatate (TESSALON) 100 MG capsule Take 1 capsule (100 mg total) by mouth every 8 (eight) hours. Patient not taking: Reported on 06/14/2015 02/11/15   Rolland Porter, MD  cephALEXin (KEFLEX) 500 MG capsule Take 1 capsule (500 mg total) by mouth 4 (four) times daily. Patient not taking: Reported on 06/14/2015 02/10/15  Joycie Peek, PA-C  Cholecalciferol (VITAMIN D PO) Take 1 tablet by mouth daily.    Historical Provider, MD  dimenhyDRINATE (DRAMAMINE) 50 MG tablet Take 50 mg by mouth every 8 (eight) hours as needed for nausea.    Historical Provider, MD  gabapentin (NEURONTIN) 400 MG capsule Take 1 capsule (400 mg total) by mouth 3 (three) times daily. Patient not taking: Reported on 02/03/2015 06/30/14   Doris Cheadle, MD  gabapentin (NEURONTIN) 800 MG tablet Take 800 mg by mouth 3 (three) times daily.    Historical Provider, MD  gemfibrozil (LOPID) 600 MG tablet Take 600 mg by mouth 2 (two) times daily. 05/16/15   Historical Provider, MD  hydrochlorothiazide (HYDRODIURIL) 12.5 MG tablet Take 1 tablet (12.5  mg total) by mouth daily. Patient not taking: Reported on 02/03/2015 03/09/14   Doris Cheadle, MD  hydrocortisone (ANUSOL-HC) 25 MG suppository Place 1 suppository (25 mg total) rectally 2 (two) times daily. Patient not taking: Reported on 02/03/2015 03/09/14   Doris Cheadle, MD  ibuprofen (ADVIL,MOTRIN) 800 MG tablet Take 1 tablet (800 mg total) by mouth 3 (three) times daily. 02/11/15   Rolland Porter, MD  levofloxacin (LEVAQUIN) 750 MG tablet Take 1 tablet (750 mg total) by mouth daily. X 7 days Patient not taking: Reported on 06/14/2015 02/03/15   Richardean Canal, MD  meloxicam (MOBIC) 15 MG tablet Take 15 mg by mouth daily. 05/16/15   Historical Provider, MD  methocarbamol (ROBAXIN) 750 MG tablet Take 375 mg by mouth 3 (three) times daily as needed. 05/30/15   Historical Provider, MD  metoCLOPramide (REGLAN) 10 MG tablet Take 1 tablet (10 mg total) by mouth every 6 (six) hours as needed for nausea. 02/03/15   Richardean Canal, MD  metoprolol succinate (TOPROL-XL) 25 MG 24 hr tablet Take 50 mg by mouth daily. 05/30/15   Historical Provider, MD  metoprolol tartrate (LOPRESSOR) 25 MG tablet Take 25 mg by mouth 2 (two) times daily.    Historical Provider, MD  nicotine (NICODERM CQ) 21 mg/24hr patch Place 1 patch (21 mg total) onto the skin daily. Patient not taking: Reported on 02/03/2015 03/09/14   Doris Cheadle, MD  omeprazole (PRILOSEC) 40 MG capsule Take 1 capsule (40 mg total) by mouth daily. Patient not taking: Reported on 02/03/2015 03/09/14   Doris Cheadle, MD  oxyCODONE-acetaminophen (PERCOCET) 10-325 MG per tablet Take 1 tablet by mouth every 6 (six) hours. 06/13/15   Historical Provider, MD  predniSONE (DELTASONE) 50 MG tablet Take 1 tablet (50 mg total) by mouth daily with breakfast. Patient not taking: Reported on 06/14/2015 02/03/15   Richardean Canal, MD  promethazine (PHENERGAN) 25 MG tablet Take 1 tablet (25 mg total) by mouth every 6 (six) hours as needed for nausea or vomiting. Patient not taking: Reported on  06/14/2015 02/10/15   Joycie Peek, PA-C  Vitamin D, Ergocalciferol, (DRISDOL) 50000 UNITS CAPS capsule Take 1 capsule (50,000 Units total) by mouth every 7 (seven) days. Patient not taking: Reported on 02/03/2015 03/10/14   Doris Cheadle, MD   BP 149/97 mmHg  Pulse 90  Temp(Src) 97.6 F (36.4 C) (Oral)  Resp 16  Ht 5' 11.5" (1.816 m)  Wt 379 lb (171.913 kg)  BMI 52.13 kg/m2  SpO2 98%   Physical Exam  Constitutional: He is oriented to person, place, and time. He appears well-developed and well-nourished.  HENT:  Head: Normocephalic and atraumatic.  Cardiovascular: Normal rate.   Pulmonary/Chest: Effort normal and breath sounds normal. No respiratory distress. He  has no wheezes. He has no rales. He exhibits no tenderness.  Abdominal: He exhibits no distension.  Musculoskeletal:  Obvious swelling and edema to the right knee and lower extremity. Patient is tender to palpation of the knee, no focal tenderness, painful palpation of the lower extremity Patient has full active range of motion of the knee. No signs of infection, not warm to touch, no soft tissue damage. Unable to perform special test due to patient's pain  Neurological: He is alert and oriented to person, place, and time.  Skin: Skin is warm and dry.  Psychiatric: He has a normal mood and affect.  Nursing note and vitals reviewed.   ED Course  Procedures (including critical care time)  DIAGNOSTIC STUDIES: Oxygen Saturation is 98% on room air, normal by my interpretation.    COORDINATION OF CARE: 4:45 PM Discussed treatment plan with pt at bedside and pt agreed to plan.   Labs Review Labs Reviewed - No data to display  Imaging Review No results found. I have personally reviewed and evaluated these images and lab results as part of my medical decision-making.   EKG Interpretation None      MDM   Final diagnoses:  Right knee pain   Patient left the ED without telling myself or nursing staff, he left  before lower extremity ultrasound could be performed  Labs:  Imaging:ultrasound venous right lower extremity complete  Consults:  Therapeutics:ice  Discharge Meds:   Assessment/Plan: Pt will receive ultrasound of his right leg to rule out blood clots.   Patient presents with right knee pain, lower extremity swelling and edema. Patient requesting narcotic pain medication, informed him that he would need to follow-up with his orthopedist specialist forpain management. Patient reports that he saw Dr. August Saucer last week pain medication he is receiving is not adequate.I informed him that I was concerned about DVT in the right lower extremity due to swelling, we agreed to do an ultrasound exam. Return to check on the patient, he was no longer in the room, nursing staff did not see him leave, unaware of his absence.   I personally performed the services described in this documentation, which was scribed in my presence. The recorded information has been reviewed and is accurate.   Eyvonne Mechanic, PA-C 08/07/15 1857  Melene Plan, DO 08/08/15 1610

## 2015-09-29 ENCOUNTER — Encounter (HOSPITAL_COMMUNITY): Payer: Self-pay | Admitting: *Deleted

## 2015-09-29 ENCOUNTER — Emergency Department (HOSPITAL_COMMUNITY): Payer: No Typology Code available for payment source

## 2015-09-29 ENCOUNTER — Emergency Department (HOSPITAL_COMMUNITY)
Admission: EM | Admit: 2015-09-29 | Discharge: 2015-09-29 | Disposition: A | Discharge status: Home / Self Care | Payer: No Typology Code available for payment source | Attending: Emergency Medicine | Admitting: Emergency Medicine

## 2015-09-29 DIAGNOSIS — G629 Polyneuropathy, unspecified: Secondary | ICD-10-CM | POA: Diagnosis not present

## 2015-09-29 DIAGNOSIS — K219 Gastro-esophageal reflux disease without esophagitis: Secondary | ICD-10-CM | POA: Diagnosis not present

## 2015-09-29 DIAGNOSIS — S199XXA Unspecified injury of neck, initial encounter: Secondary | ICD-10-CM | POA: Insufficient documentation

## 2015-09-29 DIAGNOSIS — Y998 Other external cause status: Secondary | ICD-10-CM | POA: Diagnosis not present

## 2015-09-29 DIAGNOSIS — S8991XA Unspecified injury of right lower leg, initial encounter: Secondary | ICD-10-CM | POA: Insufficient documentation

## 2015-09-29 DIAGNOSIS — Z791 Long term (current) use of non-steroidal anti-inflammatories (NSAID): Secondary | ICD-10-CM | POA: Insufficient documentation

## 2015-09-29 DIAGNOSIS — I10 Essential (primary) hypertension: Secondary | ICD-10-CM | POA: Diagnosis not present

## 2015-09-29 DIAGNOSIS — R200 Anesthesia of skin: Secondary | ICD-10-CM | POA: Insufficient documentation

## 2015-09-29 DIAGNOSIS — Z7982 Long term (current) use of aspirin: Secondary | ICD-10-CM | POA: Insufficient documentation

## 2015-09-29 DIAGNOSIS — Y92410 Unspecified street and highway as the place of occurrence of the external cause: Secondary | ICD-10-CM | POA: Insufficient documentation

## 2015-09-29 DIAGNOSIS — G8929 Other chronic pain: Secondary | ICD-10-CM | POA: Insufficient documentation

## 2015-09-29 DIAGNOSIS — Z79899 Other long term (current) drug therapy: Secondary | ICD-10-CM | POA: Diagnosis not present

## 2015-09-29 DIAGNOSIS — S3992XA Unspecified injury of lower back, initial encounter: Secondary | ICD-10-CM | POA: Diagnosis not present

## 2015-09-29 DIAGNOSIS — Y9389 Activity, other specified: Secondary | ICD-10-CM | POA: Insufficient documentation

## 2015-09-29 DIAGNOSIS — Z720 Tobacco use: Secondary | ICD-10-CM | POA: Diagnosis not present

## 2015-09-29 DIAGNOSIS — M25561 Pain in right knee: Secondary | ICD-10-CM

## 2015-09-29 MED ORDER — OXYCODONE-ACETAMINOPHEN 5-325 MG PO TABS
1.0000 | ORAL_TABLET | Freq: Once | ORAL | Status: AC
  Administered 2015-09-29: 1 via ORAL
  Filled 2015-09-29: qty 1

## 2015-09-29 MED ORDER — HYDROCODONE-ACETAMINOPHEN 5-325 MG PO TABS: 1.0000 | ORAL_TABLET | Freq: Four times a day (QID) | ORAL | Status: DC | PRN

## 2015-09-29 NOTE — ED Notes (Signed)
Patient transported to X-ray 
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
## 2015-09-29 NOTE — ED Notes (Signed)
C-collar removed by Onalee Huaavid, NP
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
## 2015-09-29 NOTE — ED Notes (Signed)
Pt provided Coke & saltines

## 2015-10-06 ENCOUNTER — Encounter (HOSPITAL_COMMUNITY): Payer: Self-pay

## 2015-10-06 ENCOUNTER — Emergency Department (HOSPITAL_COMMUNITY)
Admission: EM | Admit: 2015-10-06 | Discharge: 2015-10-06 | Disposition: A | Discharge status: Home / Self Care | Payer: Medicare Other | Attending: Emergency Medicine | Admitting: Emergency Medicine

## 2015-10-06 DIAGNOSIS — M542 Cervicalgia: Secondary | ICD-10-CM | POA: Diagnosis not present

## 2015-10-06 DIAGNOSIS — M545 Low back pain: Secondary | ICD-10-CM | POA: Insufficient documentation

## 2015-10-06 DIAGNOSIS — Z8719 Personal history of other diseases of the digestive system: Secondary | ICD-10-CM | POA: Diagnosis not present

## 2015-10-06 DIAGNOSIS — Z720 Tobacco use: Secondary | ICD-10-CM | POA: Diagnosis not present

## 2015-10-06 DIAGNOSIS — Z791 Long term (current) use of non-steroidal anti-inflammatories (NSAID): Secondary | ICD-10-CM | POA: Diagnosis not present

## 2015-10-06 DIAGNOSIS — M79652 Pain in left thigh: Secondary | ICD-10-CM | POA: Diagnosis not present

## 2015-10-06 DIAGNOSIS — I10 Essential (primary) hypertension: Secondary | ICD-10-CM | POA: Insufficient documentation

## 2015-10-06 DIAGNOSIS — Z79899 Other long term (current) drug therapy: Secondary | ICD-10-CM | POA: Insufficient documentation

## 2015-10-06 DIAGNOSIS — Z7982 Long term (current) use of aspirin: Secondary | ICD-10-CM | POA: Insufficient documentation

## 2015-10-06 DIAGNOSIS — M25561 Pain in right knee: Secondary | ICD-10-CM | POA: Insufficient documentation

## 2015-10-06 DIAGNOSIS — G8929 Other chronic pain: Secondary | ICD-10-CM | POA: Insufficient documentation

## 2015-10-06 MED ORDER — CYCLOBENZAPRINE HCL 10 MG PO TABS: 10.0000 mg | ORAL_TABLET | Freq: Two times a day (BID) | ORAL | Status: DC | PRN

## 2015-10-06 MED ORDER — CYCLOBENZAPRINE HCL 10 MG PO TABS
5.0000 mg | ORAL_TABLET | Freq: Once | ORAL | Status: AC
  Administered 2015-10-06: 5 mg via ORAL
  Filled 2015-10-06: qty 1

## 2015-10-06 NOTE — Discharge Instructions (Signed)
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
## 2015-10-06 NOTE — ED Notes (Signed)
Pt left at this time with all belongings.  
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
## 2015-10-17 ENCOUNTER — Ambulatory Visit (INDEPENDENT_AMBULATORY_CARE_PROVIDER_SITE_OTHER): Payer: Medicare Other | Admitting: Family Medicine

## 2015-10-17 ENCOUNTER — Encounter: Payer: Self-pay | Admitting: Family Medicine

## 2015-10-17 VITALS — BP 138/87 | HR 75 | Temp 97.8°F | Resp 18 | Ht 70.0 in | Wt 372.0 lb

## 2015-10-17 DIAGNOSIS — F17200 Nicotine dependence, unspecified, uncomplicated: Secondary | ICD-10-CM

## 2015-10-17 DIAGNOSIS — F32A Depression, unspecified: Secondary | ICD-10-CM

## 2015-10-17 DIAGNOSIS — M792 Neuralgia and neuritis, unspecified: Secondary | ICD-10-CM

## 2015-10-17 DIAGNOSIS — E669 Obesity, unspecified: Secondary | ICD-10-CM | POA: Diagnosis not present

## 2015-10-17 DIAGNOSIS — Z720 Tobacco use: Secondary | ICD-10-CM

## 2015-10-17 DIAGNOSIS — M545 Low back pain, unspecified: Secondary | ICD-10-CM

## 2015-10-17 DIAGNOSIS — IMO0001 Reserved for inherently not codable concepts without codable children: Secondary | ICD-10-CM

## 2015-10-17 DIAGNOSIS — I10 Essential (primary) hypertension: Secondary | ICD-10-CM | POA: Diagnosis not present

## 2015-10-17 DIAGNOSIS — F329 Major depressive disorder, single episode, unspecified: Secondary | ICD-10-CM

## 2015-10-17 DIAGNOSIS — M25561 Pain in right knee: Secondary | ICD-10-CM | POA: Diagnosis not present

## 2015-10-17 DIAGNOSIS — G8929 Other chronic pain: Secondary | ICD-10-CM | POA: Diagnosis not present

## 2015-10-17 DIAGNOSIS — F418 Other specified anxiety disorders: Secondary | ICD-10-CM | POA: Diagnosis not present

## 2015-10-17 DIAGNOSIS — F419 Anxiety disorder, unspecified: Secondary | ICD-10-CM
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
## 2015-11-08 ENCOUNTER — Encounter: Payer: Medicare Other | Admitting: Physician Assistant

## 2016-02-15 ENCOUNTER — Encounter: Payer: Medicare Other | Admitting: Diagnostic Neuroimaging

## 2016-02-29 ENCOUNTER — Emergency Department (HOSPITAL_COMMUNITY)
Admission: EM | Admit: 2016-02-29 | Discharge: 2016-02-29 | Disposition: A | Discharge status: Home / Self Care | Payer: Medicare Other | Attending: Emergency Medicine | Admitting: Emergency Medicine

## 2016-02-29 ENCOUNTER — Encounter (HOSPITAL_COMMUNITY): Payer: Self-pay | Admitting: *Deleted

## 2016-02-29 DIAGNOSIS — Z23 Encounter for immunization: Secondary | ICD-10-CM | POA: Insufficient documentation

## 2016-02-29 DIAGNOSIS — Z7982 Long term (current) use of aspirin: Secondary | ICD-10-CM | POA: Diagnosis not present

## 2016-02-29 DIAGNOSIS — Z79899 Other long term (current) drug therapy: Secondary | ICD-10-CM | POA: Insufficient documentation

## 2016-02-29 DIAGNOSIS — L03114 Cellulitis of left upper limb: Secondary | ICD-10-CM

## 2016-02-29 DIAGNOSIS — Z8719 Personal history of other diseases of the digestive system: Secondary | ICD-10-CM | POA: Insufficient documentation

## 2016-02-29 DIAGNOSIS — G629 Polyneuropathy, unspecified: Secondary | ICD-10-CM | POA: Diagnosis not present

## 2016-02-29 DIAGNOSIS — G8929 Other chronic pain: Secondary | ICD-10-CM | POA: Insufficient documentation

## 2016-02-29 DIAGNOSIS — L02512 Cutaneous abscess of left hand: Secondary | ICD-10-CM

## 2016-02-29 DIAGNOSIS — Z791 Long term (current) use of non-steroidal anti-inflammatories (NSAID): Secondary | ICD-10-CM | POA: Diagnosis not present

## 2016-02-29 DIAGNOSIS — Z8614 Personal history of Methicillin resistant Staphylococcus aureus infection: Secondary | ICD-10-CM | POA: Insufficient documentation

## 2016-02-29 DIAGNOSIS — I10 Essential (primary) hypertension: Secondary | ICD-10-CM | POA: Diagnosis not present

## 2016-02-29 DIAGNOSIS — F17210 Nicotine dependence, cigarettes, uncomplicated: Secondary | ICD-10-CM | POA: Diagnosis not present

## 2016-02-29 DIAGNOSIS — M79642 Pain in left hand: Secondary | ICD-10-CM | POA: Diagnosis present

## 2016-02-29 MED ORDER — TETANUS-DIPHTH-ACELL PERTUSSIS 5-2.5-18.5 LF-MCG/0.5 IM SUSP
0.5000 mL | Freq: Once | INTRAMUSCULAR | Status: AC
  Administered 2016-02-29: 0.5 mL via INTRAMUSCULAR
  Filled 2016-02-29: qty 0.5

## 2016-02-29 MED ORDER — SULFAMETHOXAZOLE-TRIMETHOPRIM 800-160 MG PO TABS
1.0000 | ORAL_TABLET | Freq: Two times a day (BID) | ORAL | Status: DC
Start: 2016-02-29 — End: 2016-03-05

## 2016-02-29 MED ORDER — CEPHALEXIN 500 MG PO CAPS: 1000.0000 mg | ORAL_CAPSULE | Freq: Two times a day (BID) | ORAL | Status: DC

## 2016-02-29 MED ORDER — LIDOCAINE-EPINEPHRINE (PF) 2 %-1:200000 IJ SOLN
10.0000 mL | Freq: Once | INTRAMUSCULAR | Status: AC
  Administered 2016-02-29: 10 mL via INTRADERMAL
  Filled 2016-02-29: qty 20

## 2016-02-29 MED ORDER — OXYCODONE-ACETAMINOPHEN 5-325 MG PO TABS: 2.0000 | ORAL_TABLET | ORAL | Status: DC | PRN

## 2016-02-29 NOTE — ED Provider Notes (Signed)
CSN: [id number]     Arrival date & time 02/29/16  1004 History  By signing my name below, I, [name], attest that this documentation has been prepared under the direction and in the presence of Fayrene Helper, PA-C. Electronically Signed: [name], ED Scribe. 02/29/2016. 11:41 AM.    Chief Complaint  Patient presents with  . Hand Pain   The history is provided by the patient. No language interpreter was used.   HPI Comments: [name] is a 40 y.o. male with a history of MRSA, who presents to the Emergency Department complaining of a recurrent, localized area of burning, stinging, severe pain, redness, and swelling to his left hand that he first noticed 6 days ago. He is unsure of any known causes for his symptoms but states he was diagnosed with MRSA and periodically has had similar episodes in the past, treated by incision and drainage. He states he is unsure of the date of his last tetanus vaccination. Patient is right-hand-dominant. Patient has known allergies to Tramadol, Zofran, and Vicodin.   Past Medical History  Diagnosis Date  . Neuropathy (HCC)   . GERD (gastroesophageal reflux disease)   . Chronic back pain   . Hypertension    Past Surgical History  Procedure Laterality Date  . Hand surgery    . Tonsillectomy    . Back surgery    . Knee surgery Right    Family History  Problem Relation Age of Onset  . Heart failure Mother   . Heart disease Mother   . Diabetes Father   . Stroke Brother    Social History  Substance Use Topics  . Smoking status: Current Every Day Smoker -- 1.00 packs/day for 18 years    Types: Cigarettes  . Smokeless tobacco: Never Used  . Alcohol Use: No    Review of Systems  Constitutional: Negative for fever.  Skin: Positive for color change (localized area of pain, redness, and swelling).   Allergies  Tramadol; Zofran; and Vicodin  Home Medications   Prior to Admission medications   Medication Sig Start Date End Date Taking?  Authorizing Provider  aspirin EC 81 MG tablet Take 81 mg by mouth daily.    Historical Provider, MD  Cholecalciferol (VITAMIN D PO) Take 1 tablet by mouth daily.    Historical Provider, MD  cyclobenzaprine (FLEXERIL) 10 MG tablet Take 1 tablet (10 mg total) by mouth 2 (two) times daily as needed for muscle spasms. Patient not taking: Reported on 10/17/2015 10/06/15   Fayrene Helper, PA-C  diazepam (VALIUM) 10 MG tablet Take 10 mg by mouth 2 (two) times daily. 09/12/15   Historical Provider, MD  dimenhyDRINATE (DRAMAMINE) 50 MG tablet Take 50 mg by mouth every 8 (eight) hours as needed for nausea.    Historical Provider, MD  DULoxetine (CYMBALTA) 60 MG capsule Take 60 mg by mouth daily. 09/12/15   Historical Provider, MD  gabapentin (NEURONTIN) 800 MG tablet Take 800 mg by mouth 2 (two) times daily.     Historical Provider, MD  gemfibrozil (LOPID) 600 MG tablet Take 600 mg by mouth 2 (two) times daily. 05/16/15   Historical Provider, MD  meloxicam (MOBIC) 15 MG tablet Take 15 mg by mouth daily. 05/16/15   Historical Provider, MD  metoprolol tartrate (LOPRESSOR) 25 MG tablet Take 25 mg by mouth 2 (two) times daily.    Historical Provider, MD   BP 137/88 mmHg  Pulse 90  Temp(Src) 97.9 F (36.6 C) (Oral)  Resp  20  SpO2 98% Physical Exam  Constitutional: He is oriented to person, place, and time. He appears well-developed and well-nourished. No distress.  HENT:  Head: Normocephalic and atraumatic.  Eyes: Conjunctivae and EOM are normal.  Neck: Neck supple. No tracheal deviation present.  Cardiovascular: Normal rate.   Pulmonary/Chest: Effort normal. No respiratory distress.  Musculoskeletal: Normal range of motion.  On dorsum of right hand, there is moderate edema and warmth noted, with a well-circumscribed area of induration noted to the proximal ulnar aspect of the hand, that is tender to palpation. No involvement of the fingers. Normal grip strength. Radial pulses 2+.  Neurological: He is alert  and oriented to person, place, and time.  Skin: Skin is warm and dry.  Psychiatric: He has a normal mood and affect. His behavior is normal.  Nursing note and vitals reviewed.   ED Course  Procedures (including critical care time)  DIAGNOSTIC STUDIES: Oxygen Saturation is 98% on RA, normal by my interpretation.    COORDINATION OF CARE: 10:47 AM - Discussed treatment plan with pt at bedside which includes incision and drainage procedure. Will also update tetanus vaccination. Pt verbalized understanding and agreed to plan.   INCISION AND DRAINAGE PROCEDURE NOTE: Patient identification was confirmed and verbal consent was obtained. This procedure was performed by [name], PA-C, at 11:31 AM. Site: Left hand Sterile procedures observed Needle size: 25 Anesthetic used (type and amt): Lidocaine 2% w/ epinephrine, 3 mL Blade size: 11 Drainage: Mild, small amount of purulent drainage, with moderate sanguinous drainage Complexity: Complex Packing used Site anesthetized, incision made over site, wound drained and explored loculations, rinsed with copious amounts of normal saline, wound packed with sterile gauze, covered with dry, sterile dressing.  Pt tolerated procedure well without complications.  Instructions for care discussed verbally and pt provided with additional written instructions for homecare and f/u. Will discharge home with antibiotics.  Pt instructed to return for repeat I&D if his symptoms persist in 2 days .  MDM   Final diagnoses:  Cellulitis of left hand  Abscess of hand without fingers or thumb, left   Patient with skin abscess amenable to incision and drainage.  Mild signs of cellulitis is surrounding skin.  Suspect deeper pocket of pus present; however, did not cut any deeper due to sensitivity of hand area.  Will d/c to home with Keflex and Bactrim.  Encouraged home warm soaks and flushing.  Pt instructed to return in 2 days for wound recheck and removal of  packing.  I personally performed the services described in this documentation, which was scribed in my presence. The recorded information has been reviewed and is accurate.   BP 137/88 mmHg  Pulse 90  Temp(Src) 97.9 F (36.6 C) (Oral)  Resp 20  SpO2 98%    Fayrene HelperBowie Sowmya Partridge, PA-C 02/29/16 1143  Benjiman CoreNathan Pickering, MD 02/29/16 1531

## 2016-02-29 NOTE — Discharge Instructions (Signed)
Continue to apply warm compress to affected region several times daily.  Take antibiotics as prescribed.  Return in 48 hrs if no improvement.  Packing removal in 48 hrs.    Abscess An abscess is an infected area that contains a collection of pus and debris.It can occur in almost any part of the body. An abscess is also known as a furuncle or boil. CAUSES  An abscess occurs when tissue gets infected. This can occur from blockage of oil or sweat glands, infection of hair follicles, or a minor injury to the skin. As the body tries to fight the infection, pus collects in the area and creates pressure under the skin. This pressure causes pain. People with weakened immune systems have difficulty fighting infections and get certain abscesses more often.  SYMPTOMS Usually an abscess develops on the skin and becomes a painful mass that is red, warm, and tender. If the abscess forms under the skin, you may feel a moveable soft area under the skin. Some abscesses break open (rupture) on their own, but most will continue to get worse without care. The infection can spread deeper into the body and eventually into the bloodstream, causing you to feel ill.  DIAGNOSIS  Your caregiver will take your medical history and perform a physical exam. A sample of fluid may also be taken from the abscess to determine what is causing your infection. TREATMENT  Your caregiver may prescribe antibiotic medicines to fight the infection. However, taking antibiotics alone usually does not cure an abscess. Your caregiver may need to make a small cut (incision) in the abscess to drain the pus. In some cases, gauze is packed into the abscess to reduce pain and to continue draining the area. HOME CARE INSTRUCTIONS   Only take over-the-counter or prescription medicines for pain, discomfort, or fever as directed by your caregiver.  If you were prescribed antibiotics, take them as directed. Finish them even if you start to feel  better.  If gauze is used, follow your caregiver's directions for changing the gauze.  To avoid spreading the infection:  Keep your draining abscess covered with a bandage.  Wash your hands well.  Do not share personal care items, towels, or whirlpools with others.  Avoid skin contact with others.  Keep your skin and clothes clean around the abscess.  Keep all follow-up appointments as directed by your caregiver. SEEK MEDICAL CARE IF:   You have increased pain, swelling, redness, fluid drainage, or bleeding.  You have muscle aches, chills, or a general ill feeling.  You have a fever. MAKE SURE YOU:   Understand these instructions.  Will watch your condition.  Will get help right away if you are not doing well or get worse.   This information is not intended to replace advice given to you by your health care provider. Make sure you discuss any questions you have with your health care provider.   Document Released: 08/21/2005 Document Revised: 05/12/2012 Document Reviewed: 01/24/2012 Elsevier Interactive Patient Education 2016 Elsevier Inc.  Cellulitis Cellulitis is an infection of the skin and the tissue under the skin. The infected area is usually red and tender. This happens most often in the arms and lower legs. HOME CARE   Take your antibiotic medicine as told. Finish the medicine even if you start to feel better.  Keep the infected arm or leg raised (elevated).  Put a warm cloth on the area up to 4 times per day.  Only take medicines as told  by your doctor.  Keep all doctor visits as told. GET HELP IF:  You see red streaks on the skin coming from the infected area.  Your red area gets bigger or turns a dark color.  Your bone or joint under the infected area is painful after the skin heals.  Your infection comes back in the same area or different area.  You have a puffy (swollen) bump in the infected area.  You have new symptoms.  You have a  fever. GET HELP RIGHT AWAY IF:   You feel very sleepy.  You throw up (vomit) or have watery poop (diarrhea).  You feel sick and have muscle aches and pains.   This information is not intended to replace advice given to you by your health care provider. Make sure you discuss any questions you have with your health care provider.   Document Released: 04/29/2008 Document Revised: 08/02/2015 Document Reviewed: 01/27/2012 Elsevier Interactive Patient Education [employer]2016 Elsevier Inc.

## 2016-02-29 NOTE — ED Notes (Signed)
Pt has swelling,pain and redness to left hand x 1 week. Hx of multiple episodes of MRSA per pt.

## 2016-02-29 NOTE — ED Notes (Signed)
Pt reports L hand redness & swelling with 2 cm x 2 cm abcess to L hand onset yesterday, pt denies drainage to the area, + swelling, pt able to wiggle fingers, pt reports hx of MRSA, A&O x4

## 2016-03-02 ENCOUNTER — Emergency Department (HOSPITAL_COMMUNITY)
Admission: EM | Admit: 2016-03-02 | Discharge: 2016-03-04 | Disposition: A | Discharge status: Home / Self Care | Payer: Medicare Other | Attending: Emergency Medicine | Admitting: Emergency Medicine

## 2016-03-02 ENCOUNTER — Encounter (HOSPITAL_COMMUNITY): Payer: Self-pay | Admitting: Nurse Practitioner

## 2016-03-02 DIAGNOSIS — F17210 Nicotine dependence, cigarettes, uncomplicated: Secondary | ICD-10-CM | POA: Insufficient documentation

## 2016-03-02 DIAGNOSIS — Z5321 Procedure and treatment not carried out due to patient leaving prior to being seen by health care provider: Secondary | ICD-10-CM | POA: Insufficient documentation

## 2016-03-02 DIAGNOSIS — I10 Essential (primary) hypertension: Secondary | ICD-10-CM | POA: Insufficient documentation

## 2016-03-02 DIAGNOSIS — G8929 Other chronic pain: Secondary | ICD-10-CM | POA: Insufficient documentation

## 2016-03-02 DIAGNOSIS — L089 Local infection of the skin and subcutaneous tissue, unspecified: Secondary | ICD-10-CM | POA: Diagnosis present

## 2016-03-02 NOTE — ED Notes (Addendum)
Pt was here 2 days ago for cellulitis of L hand, started on oral antibiotics which he has been taking but woke today with increased pain, redness and swelling to the L hand

## 2016-03-03 NOTE — ED Provider Notes (Signed)
Patient bedded in ED on EPIC but LWBS. I did not directly see or provide care to this patient. See RN notes.  Shaune Pollackameron Alioune Hodgkin, MD 03/03/16 [phone]  Marily MemosJason Mesner, MD 03/03/16 [phone]

## 2016-03-05 ENCOUNTER — Encounter (HOSPITAL_COMMUNITY): Payer: Self-pay | Admitting: Emergency Medicine

## 2016-03-05 ENCOUNTER — Emergency Department (HOSPITAL_COMMUNITY)
Admission: EM | Admit: 2016-03-05 | Discharge: 2016-03-05 | Disposition: A | Discharge status: Home / Self Care | Payer: Medicare Other | Attending: Emergency Medicine | Admitting: Emergency Medicine

## 2016-03-05 ENCOUNTER — Emergency Department (HOSPITAL_COMMUNITY): Payer: Medicare Other

## 2016-03-05 DIAGNOSIS — Z7982 Long term (current) use of aspirin: Secondary | ICD-10-CM | POA: Insufficient documentation

## 2016-03-05 DIAGNOSIS — L03114 Cellulitis of left upper limb: Secondary | ICD-10-CM | POA: Diagnosis not present

## 2016-03-05 DIAGNOSIS — Z792 Long term (current) use of antibiotics: Secondary | ICD-10-CM | POA: Insufficient documentation

## 2016-03-05 DIAGNOSIS — G629 Polyneuropathy, unspecified: Secondary | ICD-10-CM | POA: Diagnosis not present

## 2016-03-05 DIAGNOSIS — Z8719 Personal history of other diseases of the digestive system: Secondary | ICD-10-CM | POA: Insufficient documentation

## 2016-03-05 DIAGNOSIS — F17210 Nicotine dependence, cigarettes, uncomplicated: Secondary | ICD-10-CM | POA: Insufficient documentation

## 2016-03-05 DIAGNOSIS — Z79899 Other long term (current) drug therapy: Secondary | ICD-10-CM | POA: Insufficient documentation

## 2016-03-05 DIAGNOSIS — I10 Essential (primary) hypertension: Secondary | ICD-10-CM | POA: Diagnosis not present

## 2016-03-05 DIAGNOSIS — G8929 Other chronic pain: Secondary | ICD-10-CM | POA: Diagnosis not present

## 2016-03-05 DIAGNOSIS — L02512 Cutaneous abscess of left hand: Secondary | ICD-10-CM | POA: Diagnosis not present

## 2016-03-05 DIAGNOSIS — Z791 Long term (current) use of non-steroidal anti-inflammatories (NSAID): Secondary | ICD-10-CM | POA: Insufficient documentation

## 2016-03-05 LAB — BASIC METABOLIC PANEL
ANION GAP: 8 (ref 5–15)
BUN: 15 mg/dL (ref 6–20)
CALCIUM: 9.1 mg/dL (ref 8.9–10.3)
CO2: 26 mmol/L (ref 22–32)
CREATININE: 1.13 mg/dL (ref 0.61–1.24)
Chloride: 105 mmol/L (ref 101–111)
GFR calc Af Amer: >60 mL/min (ref >60)
GFR calc non Af Amer: >60 mL/min (ref >60)
GLUCOSE: 84 mg/dL (ref 65–99)
Potassium: 4.6 mmol/L (ref 3.5–5.1)
Sodium: 139 mmol/L (ref 135–145)

## 2016-03-05 LAB — CBC WITH DIFFERENTIAL/PLATELET
BASOS PCT: 0 %
Basophils Absolute: 0 10*3/uL (ref 0.0–0.1)
Eosinophils Absolute: 0.3 10*3/uL (ref 0.0–0.7)
Eosinophils Relative: 4 %
HCT: 43.7 % (ref 39.0–52.0)
Hemoglobin: 15 g/dL (ref 13.0–17.0)
Lymphocytes Relative: 37 %
Lymphs Abs: 2.9 10*3/uL (ref 0.7–4.0)
MCH: 30.9 pg (ref 26.0–34.0)
MCHC: 34.3 g/dL (ref 30.0–36.0)
MCV: 89.9 fL (ref 78.0–100.0)
MONO ABS: 0.5 10*3/uL (ref 0.1–1.0)
Monocytes Relative: 6 %
NEUTROS ABS: 4 10*3/uL (ref 1.7–7.7)
Neutrophils Relative %: 53 %
Platelets: 239 10*3/uL (ref 150–400)
RBC: 4.86 MIL/uL (ref 4.22–5.81)
RDW: 12.2 % (ref 11.5–15.5)
WBC: 7.7 10*3/uL (ref 4.0–10.5)

## 2016-03-05 MED ORDER — PIPERACILLIN-TAZOBACTAM 3.375 G IVPB 30 MIN
3.3750 g | Freq: Once | INTRAVENOUS | Status: AC
  Administered 2016-03-05: 3.375 g via INTRAVENOUS
  Filled 2016-03-05: qty 50

## 2016-03-05 MED ORDER — SULFAMETHOXAZOLE-TRIMETHOPRIM 800-160 MG PO TABS: 1.0000 | ORAL_TABLET | Freq: Two times a day (BID) | ORAL | Status: AC

## 2016-03-05 MED ORDER — BUPIVACAINE HCL (PF) 0.5 % IJ SOLN
10.0000 mL | Freq: Once | INTRAMUSCULAR | Status: AC
  Administered 2016-03-05: 10 mL
  Filled 2016-03-05: qty 10

## 2016-03-05 MED ORDER — VANCOMYCIN HCL IN DEXTROSE 1-5 GM/200ML-% IV SOLN
1000.0000 mg | Freq: Once | INTRAVENOUS | Status: AC
  Administered 2016-03-05: 1000 mg via INTRAVENOUS
  Filled 2016-03-05: qty 200

## 2016-03-05 MED ORDER — MUPIROCIN CALCIUM 2 % NA OINT: TOPICAL_OINTMENT | NASAL | Status: DC

## 2016-03-05 MED ORDER — LIDOCAINE HCL (PF) 1 % IJ SOLN
INTRAMUSCULAR | Status: AC
  Filled 2016-03-05: qty 10

## 2016-03-05 MED ORDER — MORPHINE SULFATE (PF) 4 MG/ML IV SOLN
4.0000 mg | Freq: Once | INTRAVENOUS | Status: DC
  Filled 2016-03-05: qty 1

## 2016-03-05 MED ORDER — LIDOCAINE HCL (PF) 1 % IJ SOLN
10.0000 mL | Freq: Once | INTRAMUSCULAR | Status: AC
Start: 2016-03-05 — End: 2016-03-05
  Administered 2016-03-05: 10 mL via INTRADERMAL

## 2016-03-05 MED ORDER — MORPHINE SULFATE 15 MG PO TABS: 15.0000 mg | ORAL_TABLET | ORAL | Status: DC | PRN

## 2016-03-05 MED ORDER — CEPHALEXIN 500 MG PO CAPS: 500.0000 mg | ORAL_CAPSULE | Freq: Four times a day (QID) | ORAL | Status: DC

## 2016-03-05 MED ORDER — OXYCODONE-ACETAMINOPHEN 5-325 MG PO TABS
1.0000 | ORAL_TABLET | Freq: Once | ORAL | Status: AC
  Administered 2016-03-05: 1 via ORAL
  Filled 2016-03-05: qty 1

## 2016-03-05 NOTE — Consult Note (Signed)
Reason for Consult: Abscess left hand Referring Physician: ER staff  [name] is an 40 y.o. male.  HPI: Patient presents with an abscess to his left hand. He had an irrigation and debridement in the ER 5 days ago. He presents for worsening pain. His wound is scabbed over and he has pain problems and cellulitic change. I was requested to see him by the emergency room staff.  He denies neck back chest or abdominal pain. He is been on Bactrim and Flexed.  No cultures have been available thus far for our review.  The present time he represents a patient who is returned to the ER with worsening problems and an abscess which has not had any care over the last 5 days it appears. She with his family.    Past Medical History  Diagnosis Date  . Neuropathy ([place])   . GERD (gastroesophageal reflux disease)   . Chronic back pain   . Hypertension     Past Surgical History  Procedure Laterality Date  . Hand surgery    . Tonsillectomy    . Back surgery    . Knee surgery Right     Family History  Problem Relation Age of Onset  . Heart failure Mother   . Heart disease Mother   . Diabetes Father   . Stroke Brother     Social History:  reports that he has been smoking Cigarettes.  He has a 18 pack-year smoking history. He has never used smokeless tobacco. He reports that he does not drink alcohol or use illicit drugs.  Allergies:  Allergies  Allergen Reactions  . Tramadol Nausea And Vomiting  . Zofran [Ondansetron] Nausea And Vomiting  . Vicodin [Hydrocodone-Acetaminophen] Nausea And Vomiting    Medications: I have reviewed the patient's current medications.  Results for orders placed or performed during the hospital encounter of 03/05/16 (from the past 48 hour(s))  Basic metabolic panel     Status: None   Collection Time: 03/05/16  3:51 PM  Result Value Ref Range   Sodium 139 135 - 145 mmol/L   Potassium 4.6 3.5 - 5.1 mmol/L   Chloride 105 101 - 111 mmol/L   CO2 26 22 -  32 mmol/L   Glucose, Bld 84 65 - 99 mg/dL   BUN 15 6 - 20 mg/dL   Creatinine, Ser 1.13 0.61 - 1.24 mg/dL   Calcium 9.1 8.9 - 10.3 mg/dL   GFR calc non Af Amer >60 >60 mL/min   GFR calc Af Amer >60 >60 mL/min    Comment: (NOTE) The eGFR has been calculated using the CKD EPI equation. This calculation has not been validated in all clinical situations. eGFR's persistently <60 mL/min signify possible Chronic Kidney Disease.    Anion gap 8 5 - 15  CBC with Differential     Status: None   Collection Time: 03/05/16  3:51 PM  Result Value Ref Range   WBC 7.7 4.0 - 10.5 K/uL   RBC 4.86 4.22 - 5.81 MIL/uL   Hemoglobin 15.0 13.0 - 17.0 g/dL   HCT 43.7 39.0 - 52.0 %   MCV 89.9 78.0 - 100.0 fL   MCH 30.9 26.0 - 34.0 pg   MCHC 34.3 30.0 - 36.0 g/dL   RDW 12.2 11.5 - 15.5 %   Platelets 239 150 - 400 K/uL   Neutrophils Relative % 53 %   Neutro Abs 4.0 1.7 - 7.7 K/uL   Lymphocytes Relative 37 %   Lymphs Abs  2.9 0.7 - 4.0 K/uL   Monocytes Relative 6 %   Monocytes Absolute 0.5 0.1 - 1.0 K/uL   Eosinophils Relative 4 %   Eosinophils Absolute 0.3 0.0 - 0.7 K/uL   Basophils Relative 0 %   Basophils Absolute 0.0 0.0 - 0.1 K/uL    Dg Hand Complete Left  03/05/2016  CLINICAL DATA:  Chest pain, tightness, redness EXAM: LEFT HAND - COMPLETE 3+ VIEW COMPARISON:  None. FINDINGS: There is no evidence of fracture or dislocation. There is no evidence of arthropathy or other focal bone abnormality. Laceration of the hypothenar eminence. IMPRESSION: No acute osseous injury of the left hand. Laceration of the hypothenar eminence. Electronically Signed   By: Kathreen Devoid   On: 03/05/2016 16:47    Review of Systems  Constitutional: Negative.   Respiratory: Negative.   Gastrointestinal: Negative.   Genitourinary: Negative.   Skin: Negative.   Neurological: Negative.   Endo/Heme/Allergies: Negative.   Psychiatric/Behavioral: Negative.    Blood pressure 120/77, pulse 66, temperature 97.7 F (36.5 C),  temperature source Oral, resp. rate 16, height 5' 11" (1.803 m), weight 163.295 kg (360 lb), SpO2 100 %. Physical Exam  Obese white male alert and oriented abscess left and with cellulitis. He has a scabbed over area of pain and prominence where his previous ID took place and is worsened.  He notes no locking popping catching he is sensate he has refill about the hand is no compartment syndrome phenomenon  Assessment/Plan: Abscess left hand with cellulitic change  Procedure note patient was seen and underwent irrigation debridement.  The area about left and was prepped and draped anesthesia was used in the form of lidocaine followed by debridement of skin's obtained tissue muscle and tendon. Following this radical debridement of the tendon apparatus was accomplished. This was an excision of skin simultaneous tissue muscle excisional nature with knife curet and scissor. Patient tore this well.  Following this the patient had a radical tenolysis tenosynovectomy of the extensor apparatus. Patient tolerated this well- following the wound was packed open.  Sterile dressing was placed.  We'll plan for follow-up in office  Patient will require multiple rounds of repeat irrigation we'll plan for debridement serially in office.  We have asked him to continue Bactrim 1 by mouth twice a day 14 days and placed him on Keflex 500 4 times a day 7 days. We also placed him on mubiprocin cream.  Final diagnosis: status post irrigation debridement abscess left hand deep in nature with radical tenolysis tenosynovectomy  [name],Cassara Nida M 03/05/2016, 9:07 PM

## 2016-03-05 NOTE — ED Notes (Signed)
Patient able to ambulate independently  

## 2016-03-05 NOTE — ED Notes (Signed)
See PA ChadWest note for assessment.

## 2016-03-05 NOTE — ED Notes (Signed)
Due to previous visit with Greta DoomBowie, PA, and current complaints of continued/increased redness/pain, patient will need to be seen for possible hand surgery, and IV antibiotics are expected.

## 2016-03-05 NOTE — ED Notes (Signed)
Beckey DowningBrian Buchanon, PA at bedside from hand surgery

## 2016-03-05 NOTE — ED Notes (Signed)
Pt states he had an abscess on his left hand opened and drained about 5 days ago. Pt states his had is still draining and still very painful

## 2016-03-05 NOTE — ED Provider Notes (Signed)
CSN: [id number]     Arrival date & time 03/05/16  1357 History  By signing my name below, I, [name], attest that this documentation has been prepared under the direction and in the presence of [facility] CenterEmily Marquelle Musgrave, PA-C. Electronically Signed: [name], ED Scribe. 03/05/2016. 3:10 PM.   Chief Complaint  Patient presents with  . Abscess   The history is provided by the patient. No language interpreter was used.     HPI Comments: [name] is a 40 y.o. male with PMHx MRSA who presents to the Emergency Department complaining of gradual onset, constant, area of swelling, redness, and pain to his left hand x 10 days. Pt was seen in the ED on 02/29/2016 (approximately 5 days ago) for these symptoms. He had an I&D done and was placed on Bactrim and Keflex. Pt reports that he is still having pain to the area with intermittent swelling, continued pain, and associated green, white, and bloody drainage from the wound, and occasional hot/cold spells. Pt had swelling up to his forearm a couple of days ago that has since resolved. Pt reports that he has been compliant with his antibiotics and has been cleaning the wound with soap and water and covering it with a bandage.  Denies weakness or numbness of the fingers, vomiting, known fevers.  Pt did eat a ham sandwich just PTA.   Past Medical History  Diagnosis Date  . Neuropathy ([facility])   . GERD (gastroesophageal reflux disease)   . Chronic back pain   . Hypertension    Past Surgical History  Procedure Laterality Date  . Hand surgery    . Tonsillectomy    . Back surgery    . Knee surgery Right    Family History  Problem Relation Age of Onset  . Heart failure Mother   . Heart disease Mother   . Diabetes Father   . Stroke Brother    Social History  Substance Use Topics  . Smoking status: Current Every Day Smoker -- 1.00 packs/day for 18 years    Types: Cigarettes  . Smokeless tobacco: Never Used  . Alcohol Use: No    Review of  Systems  Constitutional: Positive for chills. Negative for fever.  Gastrointestinal: Negative for nausea and vomiting.  Musculoskeletal: Negative for joint swelling and arthralgias.  Skin: Positive for color change and wound.       + open wound to left hand + swelling, redness, pain, and drainage to the area  Allergic/Immunologic: Negative for immunocompromised state.  Neurological: Negative for weakness and numbness.  Hematological: Does not bruise/bleed easily.  Psychiatric/Behavioral: Negative for self-injury.   Allergies  Tramadol; Zofran; and Vicodin  Home Medications   Prior to Admission medications   Medication Sig Start Date End Date Taking? Authorizing Provider  aspirin EC 81 MG tablet Take 81 mg by mouth daily.    Historical Provider, MD  cephALEXin (KEFLEX) 500 MG capsule Take 2 capsules (1,000 mg total) by mouth 2 (two) times daily. 02/29/16   Fayrene HelperBowie Tran, PA-C  Cholecalciferol (VITAMIN D PO) Take 1 tablet by mouth daily.    Historical Provider, MD  cyclobenzaprine (FLEXERIL) 10 MG tablet Take 1 tablet (10 mg total) by mouth 2 (two) times daily as needed for muscle spasms. Patient not taking: Reported on 10/17/2015 10/06/15   Fayrene HelperBowie Tran, PA-C  diazepam (VALIUM) 10 MG tablet Take 10 mg by mouth 2 (two) times daily. 09/12/15   Historical Provider, MD  dimenhyDRINATE (DRAMAMINE) 50 MG tablet Take  50 mg by mouth every 8 (eight) hours as needed for nausea.    Historical Provider, MD  DULoxetine (CYMBALTA) 60 MG capsule Take 60 mg by mouth daily. 09/12/15   Historical Provider, MD  gabapentin (NEURONTIN) 800 MG tablet Take 800 mg by mouth 2 (two) times daily.     Historical Provider, MD  gemfibrozil (LOPID) 600 MG tablet Take 600 mg by mouth 2 (two) times daily. 05/16/15   Historical Provider, MD  meloxicam (MOBIC) 15 MG tablet Take 15 mg by mouth daily. 05/16/15   Historical Provider, MD  metoprolol tartrate (LOPRESSOR) 25 MG tablet Take 25 mg by mouth 2 (two) times daily.     Historical Provider, MD  oxyCODONE-acetaminophen (PERCOCET/ROXICET) 5-325 MG tablet Take 2 tablets by mouth every 4 (four) hours as needed for severe pain. 02/29/16   Fayrene Helper, PA-C  sulfamethoxazole-trimethoprim (BACTRIM DS,SEPTRA DS) 800-160 MG tablet Take 1 tablet by mouth 2 (two) times daily. 02/29/16 03/07/16  Fayrene Helper, PA-C   BP 151/96 mmHg  Pulse 92  Temp(Src) 98 F (36.7 C) (Oral)  Resp 18  Ht  (1.803 m)  Wt 360 lb (163.295 kg)  BMI 50.23 kg/m2  SpO2 97%   Physical Exam  Constitutional: He appears well-developed and well-nourished. No distress.  HENT:  Head: Normocephalic and atraumatic.  Neck: Neck supple.  Pulmonary/Chest: Effort normal.  Musculoskeletal:  Left hand: incision over dorsal hand with small amount of clear drainage and associated edema. Erythema extending over dorsum of the hand not involving the fingers or extending into forearm.  Pt does have dense dark tattoo over forearm and into hand (tattoo is old).  Please see photograph below.   Left hand: Full active range of motion of all digits, strength 5/5, sensation intact, capillary refill < 2 seconds.   Neurological: He is alert.  Skin: He is not diaphoretic.  Nursing note and vitals reviewed.       ED Course  Procedures (including critical care time)  DIAGNOSTIC STUDIES: Oxygen Saturation is 97% on RA, normal by my interpretation.    COORDINATION OF CARE: 3:05 PM-Discussed treatment plan with pt at bedside and pt agreed to plan.   Labs Review Labs Reviewed  CULTURE, ROUTINE-ABSCESS  BASIC METABOLIC PANEL  CBC WITH DIFFERENTIAL/PLATELET    Imaging Review Dg Hand Complete Left  03/05/2016  CLINICAL DATA:  Chest pain, tightness, redness EXAM: LEFT HAND - COMPLETE 3+ VIEW COMPARISON:  None. FINDINGS: There is no evidence of fracture or dislocation. There is no evidence of arthropathy or other focal bone abnormality. Laceration of the hypothenar eminence. IMPRESSION: No acute osseous injury of  the left hand. Laceration of the hypothenar eminence. Electronically Signed   By: Elige Ko   On: 03/05/2016 16:47   I have personally reviewed and evaluated these images and lab results as part of my medical decision-making.   EKG Interpretation None       3:23 PM I discussed pt with Dr Silverio Lay who recommends xray, labs, discussion with hand surgeon.  4:44 PM I discussed the patient with Dr Amanda Pea who will see the patient in the ED, recommends vanc, zosyn while in ED.    MDM   Final diagnoses:  Abscess of hand without fingers or thumb, left  Cellulitis of hand without finger or thumb, left    Afebrile nontoxic patient with left hand infection x 11 days, presenting with increased pain, swelling, and erythema.  It is unclear from the chart how different the presentation is today.  I&D was 5 days ago and pt states he is taking both antibiotics as prescribed (Keflex, Bactrim) but has had no follow up.  Pt has no fever in ED, WBC is normal.  Given vanc and zosyn in ED.  Pt seen in ED by Dr Amanda Pea and Paris Lore, PA-C.  Please see their consult note for further details.  Pt d/c home with medications per their recommendations, bactroban 2g BID bilateral nares x 7 days, Bactrim DS BID x 14 days, Keflex QID x 7 days.  Their office will contact him with close follow up appointment.  Discussed result, findings, treatment, and follow up  with patient.  Pt given return precautions.  Pt verbalizes understanding and agrees with plan.      I personally performed the services described in this documentation, which was scribed in my presence. The recorded information has been reviewed and is accurate.     Trixie Dredge, PA-C 03/05/16 2254  Richardean Canal, MD 03/06/16 [phone]

## 2016-03-05 NOTE — ED Notes (Signed)
Patient states he will need to leave soon.  Patient states he has a ride that he needs to go home with.  PA aware.

## 2016-03-05 NOTE — Discharge Instructions (Signed)
Read the information below.  Use the prescribed medication as directed.  Please discuss all new medications with your pharmacist.  You may return to the Emergency Department at any time for worsening condition or any new symptoms that concern you.  If you develop increased redness, swelling, uncontrolled pain, or fevers greater than 100.4, return to the ER immediately for a recheck.    Please follow up with Dr Amanda Pea in the office as directed and follow his instructions for home care.     Abscess An abscess is an infected area that contains a collection of pus and debris.It can occur in almost any part of the body. An abscess is also known as a furuncle or boil. CAUSES  An abscess occurs when tissue gets infected. This can occur from blockage of oil or sweat glands, infection of hair follicles, or a minor injury to the skin. As the body tries to fight the infection, pus collects in the area and creates pressure under the skin. This pressure causes pain. People with weakened immune systems have difficulty fighting infections and get certain abscesses more often.  SYMPTOMS Usually an abscess develops on the skin and becomes a painful mass that is red, warm, and tender. If the abscess forms under the skin, you may feel a moveable soft area under the skin. Some abscesses break open (rupture) on their own, but most will continue to get worse without care. The infection can spread deeper into the body and eventually into the bloodstream, causing you to feel ill.  DIAGNOSIS  Your caregiver will take your medical history and perform a physical exam. A sample of fluid may also be taken from the abscess to determine what is causing your infection. TREATMENT  Your caregiver may prescribe antibiotic medicines to fight the infection. However, taking antibiotics alone usually does not cure an abscess. Your caregiver may need to make a small cut (incision) in the abscess to drain the pus. In some cases, gauze is  packed into the abscess to reduce pain and to continue draining the area. HOME CARE INSTRUCTIONS   Only take over-the-counter or prescription medicines for pain, discomfort, or fever as directed by your caregiver.  If you were prescribed antibiotics, take them as directed. Finish them even if you start to feel better.  If gauze is used, follow your caregiver's directions for changing the gauze.  To avoid spreading the infection:  Keep your draining abscess covered with a bandage.  Wash your hands well.  Do not share personal care items, towels, or whirlpools with others.  Avoid skin contact with others.  Keep your skin and clothes clean around the abscess.  Keep all follow-up appointments as directed by your caregiver. SEEK MEDICAL CARE IF:   You have increased pain, swelling, redness, fluid drainage, or bleeding.  You have muscle aches, chills, or a general ill feeling.  You have a fever. MAKE SURE YOU:   Understand these instructions.  Will watch your condition.  Will get help right away if you are not doing well or get worse.   This information is not intended to replace advice given to you by your health care provider. Make sure you discuss any questions you have with your health care provider.   Document Released: 08/21/2005 Document Revised: 05/12/2012 Document Reviewed: 01/24/2012 Elsevier Interactive Patient Education 2016 Elsevier Inc.  Cellulitis Cellulitis is an infection of the skin and the tissue beneath it. The infected area is usually red and tender. Cellulitis occurs most often in  the arms and lower legs.  CAUSES  Cellulitis is caused by bacteria that enter the skin through cracks or cuts in the skin. The most common types of bacteria that cause cellulitis are staphylococci and streptococci. SIGNS AND SYMPTOMS   Redness and warmth.  Swelling.  Tenderness or pain.  Fever. DIAGNOSIS  Your health care provider can usually determine what is wrong  based on a physical exam. Blood tests may also be done. TREATMENT  Treatment usually involves taking an antibiotic medicine. HOME CARE INSTRUCTIONS   Take your antibiotic medicine as directed by your health care provider. Finish the antibiotic even if you start to feel better.  Keep the infected arm or leg elevated to reduce swelling.  Apply a warm cloth to the affected area up to 4 times per day to relieve pain.  Take medicines only as directed by your health care provider.  Keep all follow-up visits as directed by your health care provider. SEEK MEDICAL CARE IF:   You notice red streaks coming from the infected area.  Your red area gets larger or turns dark in color.  Your bone or joint underneath the infected area becomes painful after the skin has healed.  Your infection returns in the same area or another area.  You notice a swollen bump in the infected area.  You develop new symptoms.  You have a fever. SEEK IMMEDIATE MEDICAL CARE IF:   You feel very sleepy.  You develop vomiting or diarrhea.  You have a general ill feeling (malaise) with muscle aches and pains.   This information is not intended to replace advice given to you by your health care provider. Make sure you discuss any questions you have with your health care provider.   Document Released: 08/21/2005 Document Revised: 08/02/2015 Document Reviewed: 01/27/2012 Elsevier Interactive Patient Education [employer]2016 Elsevier Inc.

## 2016-03-06 NOTE — ED Notes (Signed)
Morphine pulled to be given to patient, and then d/c'd when Hand Surgery came.  This RN noted it in her pocket later, and was observed by Nathaniel ManEmily Bivens ([facility]-ED AD) wasting in the sharps.

## 2016-03-07 ENCOUNTER — Encounter: Payer: Medicare Other | Admitting: Diagnostic Neuroimaging

## 2016-03-08 ENCOUNTER — Encounter: Payer: Self-pay | Admitting: Diagnostic Neuroimaging

## 2016-03-09 LAB — CULTURE, ROUTINE-ABSCESS

## 2016-03-10 ENCOUNTER — Telehealth: Payer: Self-pay

## 2016-03-10 NOTE — Telephone Encounter (Signed)
Post ED Visit - Positive Culture Follow-up  Culture report reviewed by antimicrobial stewardship pharmacist:  []  [name], Pharm.D. []  [name], [address] BCPS []  [name], Pharm.D. []  [name], Pharm.D., BCPS [x]  [name], [address] BCPS, AAHIVP []  [name], Pharm.D., BCPS, AAHIVP []  [name], Pharm.D. []  [name], [address]  Positive abscess culture Treated with Cephalexin 500 mg qid x 7 days, organism sensitive to the same and no further patient follow-up is required at this time.  [name], [name] 03/10/2016, 10:45 AM

## 2016-04-11 ENCOUNTER — Ambulatory Visit (INDEPENDENT_AMBULATORY_CARE_PROVIDER_SITE_OTHER): Payer: Medicare Other | Admitting: Diagnostic Neuroimaging

## 2016-04-11 ENCOUNTER — Encounter (INDEPENDENT_AMBULATORY_CARE_PROVIDER_SITE_OTHER): Payer: Self-pay | Admitting: Diagnostic Neuroimaging

## 2016-04-11 DIAGNOSIS — M79605 Pain in left leg: Secondary | ICD-10-CM | POA: Diagnosis not present

## 2016-04-11 DIAGNOSIS — M79604 Pain in right leg: Secondary | ICD-10-CM

## 2016-04-11 DIAGNOSIS — M79601 Pain in right arm: Secondary | ICD-10-CM | POA: Diagnosis not present

## 2016-04-11 DIAGNOSIS — Z0289 Encounter for other administrative examinations: Secondary | ICD-10-CM

## 2016-04-17 NOTE — Procedures (Signed)
   [facility]  NCS (NERVE CONDUCTION STUDY) WITH EMG (ELECTROMYOGRAPHY) REPORT   STUDY DATE: 04/11/16 PATIENT NAME: [name] DOB: 17-Nov-1976 MRN: [id number]  ORDERING CLINICIAN: Selina CooleyNitka, J  TECHNOLOGIST: Gearldine ShownLorraine Jones  ELECTROMYOGRAPHER: Glenford BayleyVikram R. Freida Nebel, MD  CLINICAL INFORMATION: 40 -year-old male with chronic neck pain, right upper extremity numbness and pain, bilateral lower extremity numbness and pain, low back pain. Patient referred for electrical testing and further evaluation. Today patient reports numbness and pain in the right hand and bilateral legs (symmetric).  FINDINGS: NERVE CONDUCTION STUDY: Right median motor response has prolonged distal latency, decreased empty, normal conduction velocity and prolonged F-wave latency.  Right ulnar and right tibial motor responses and F wave latencies are normal.  Bilateral peroneal motor responses have decreased amplitudes, normal conduction velocities and normal F-wave latencies.  Left tibial motor response is prolonged distal latency, normal amplitude, normal conduction velocity and normal F-wave latency.  Right median sensory response has borderline decreased amplitude and conduction velocity.  Right ulnar and bilateral peroneal sensory responses are normal.   NEEDLE ELECTROMYOGRAPHY: Needle examination of right upper extremity altered, biceps, triceps, flexor carpi radialis, first dorsal interosseous and right C8-T1 paraspinal muscles is normal.  Right lower extremity iliopsoas, vastus medialis, tibials anterior and gastrocnemius muscles are normal except for decreased motor unit recruitment in right tibialis anterior muscle.  Right L3-4 paraspinal muscles are normal.   IMPRESSION:  Abnormal study demonstrating: 1. Right median neuropathy at the wrist consistent with right carpal tunnel syndrome. 2. No definite evidence of right cervical radiculopathy at this time. 3. Mild evidence of  bilateral lumbar radiculopathies (L5-S1).     INTERPRETING PHYSICIAN:  Suanne MarkerVIKRAM R. Rael Yo, MD Certified in Neurology, Neurophysiology and Neuroimaging  [facility] [address], [address] [place], [place] [address] [phone]

## 2016-05-11 ENCOUNTER — Other Ambulatory Visit: Payer: Self-pay | Admitting: Specialist

## 2016-05-11 DIAGNOSIS — M5416 Radiculopathy, lumbar region: Secondary | ICD-10-CM

## 2016-05-29 ENCOUNTER — Ambulatory Visit
Admission: RE | Admit: 2016-05-29 | Discharge: 2016-05-29 | Disposition: A | Discharge status: Home / Self Care | Payer: Medicare Other | Source: Ambulatory Visit | Attending: Specialist | Admitting: Specialist

## 2016-05-29 DIAGNOSIS — M5416 Radiculopathy, lumbar region: Secondary | ICD-10-CM

## 2016-05-29 MED ORDER — GADOBENATE DIMEGLUMINE 529 MG/ML IV SOLN
20.0000 mL | Freq: Once | INTRAVENOUS | Status: AC | PRN
  Administered 2016-05-29: 20 mL via INTRAVENOUS

## 2016-06-28 ENCOUNTER — Emergency Department (HOSPITAL_COMMUNITY)
Admission: EM | Admit: 2016-06-28 | Discharge: 2016-06-28 | Disposition: A | Discharge status: Home / Self Care | Payer: Medicare Other | Attending: Emergency Medicine | Admitting: Emergency Medicine

## 2016-06-28 ENCOUNTER — Encounter (HOSPITAL_COMMUNITY): Payer: Self-pay | Admitting: Emergency Medicine

## 2016-06-28 DIAGNOSIS — M7989 Other specified soft tissue disorders: Secondary | ICD-10-CM | POA: Diagnosis present

## 2016-06-28 DIAGNOSIS — Z79899 Other long term (current) drug therapy: Secondary | ICD-10-CM | POA: Insufficient documentation

## 2016-06-28 DIAGNOSIS — L03114 Cellulitis of left upper limb: Secondary | ICD-10-CM | POA: Insufficient documentation

## 2016-06-28 DIAGNOSIS — F17210 Nicotine dependence, cigarettes, uncomplicated: Secondary | ICD-10-CM | POA: Diagnosis not present

## 2016-06-28 DIAGNOSIS — Z7982 Long term (current) use of aspirin: Secondary | ICD-10-CM | POA: Insufficient documentation

## 2016-06-28 DIAGNOSIS — I10 Essential (primary) hypertension: Secondary | ICD-10-CM | POA: Insufficient documentation

## 2016-06-28 MED ORDER — CEPHALEXIN 500 MG PO CAPS: 500.0000 mg | ORAL_CAPSULE | Freq: Four times a day (QID) | ORAL | 0 refills | Status: DC

## 2016-06-28 MED ORDER — SULFAMETHOXAZOLE-TRIMETHOPRIM 800-160 MG PO TABS: 1.0000 | ORAL_TABLET | Freq: Two times a day (BID) | ORAL | 0 refills | Status: AC

## 2016-06-28 NOTE — ED Triage Notes (Signed)
Pt sts some new abscesses from MRSA; pt sts hx of same in past; noted to hands

## 2016-06-28 NOTE — ED Provider Notes (Signed)
[facility]EM[facility]ENCY DEPT Provider Note   CSN: [id number] Arrival date & time: 06/28/16  1516  First Provider Contact:   First MD Initiated Contact with Patient 06/28/16 1708      By signing my name below, I, Emmanuella Mensah, attest that this documentation has been prepared under the direction and in the presence of Cheri Fowler, PA-C. Electronically Signed: Angelene Giovanni, ED Scribe. 06/28/16. 5:19 PM.    History   Chief Complaint Chief Complaint  Patient presents with  . Abscess   HPI Comments: [name] is a 40 y.o. male with a hx of hypertension who presents to the Emergency Department complaining of ongoing gradually increasing area of swelling and redness to his left middle finger and the dorsum of right hand onset last night.  No alleviating factors noted. Pt has not tried any medications PTA. He reports a hx of these symptoms in the past and adds that it has become more frequent. He denies any fever, chills, n/v, numbness in fingers, or generalized rash.    The history is provided by the patient. No language interpreter was used.    Past Medical History:  Diagnosis Date  . Chronic back pain   . GERD (gastroesophageal reflux disease)   . Hypertension   . Neuropathy [facility])     Patient Active Problem List   Diagnosis Date Noted  . Unspecified vitamin D deficiency 06/30/2014  . Right knee pain 06/30/2014  . Essential hypertension, benign 03/09/2014  . Anxiety and depression 03/09/2014  . Chronic low back pain 03/09/2014  . Neuropathic pain 03/09/2014  . Smoking 03/09/2014  . Hemorrhoid 03/09/2014  . GERD (gastroesophageal reflux disease) 03/09/2014    Past Surgical History:  Procedure Laterality Date  . BACK SURGERY    . HAND SURGERY    . KNEE SURGERY Right   . TONSILLECTOMY         Home Medications    Prior to Admission medications   Medication Sig Start Date End Date Taking? Authorizing Provider  aspirin EC 81 MG tablet Take 81 mg by mouth daily.     Historical Provider, MD  cephALEXin (KEFLEX) 500 MG capsule Take 1 capsule (500 mg total) by mouth 4 (four) times daily. 06/28/16   Cheri Fowler, PA-C  Cholecalciferol (VITAMIN D PO) Take 1 tablet by mouth daily.    Historical Provider, MD  diazepam (VALIUM) 10 MG tablet Take 10 mg by mouth 2 (two) times daily. 09/12/15   Historical Provider, MD  dimenhyDRINATE (DRAMAMINE) 50 MG tablet Take 50 mg by mouth every 8 (eight) hours as needed for nausea.    Historical Provider, MD  DULoxetine (CYMBALTA) 60 MG capsule Take 60 mg by mouth daily. 09/12/15   Historical Provider, MD  gabapentin (NEURONTIN) 800 MG tablet Take 800 mg by mouth 2 (two) times daily.     Historical Provider, MD  gemfibrozil (LOPID) 600 MG tablet Take 600 mg by mouth 2 (two) times daily. 05/16/15   Historical Provider, MD  meloxicam (MOBIC) 15 MG tablet Take 15 mg by mouth daily. 05/16/15   Historical Provider, MD  metoprolol tartrate (LOPRESSOR) 25 MG tablet Take 25 mg by mouth 2 (two) times daily.    Historical Provider, MD  morphine (MSIR) 15 MG tablet Take 1 tablet (15 mg total) by mouth every 4 (four) hours as needed for moderate pain or severe pain. 03/05/16   Trixie Dredge, PA-C  mupirocin nasal ointment (BACTROBAN) 2 % Apply 2g in each nostril BID x 7 days 03/05/16  Trixie Dredge, PA-C  oxyCODONE-acetaminophen (PERCOCET/ROXICET) 5-325 MG tablet Take 2 tablets by mouth every 4 (four) hours as needed for severe pain. 02/29/16   Fayrene Helper, PA-C  sulfamethoxazole-trimethoprim (BACTRIM DS,SEPTRA DS) 800-160 MG tablet Take 1 tablet by mouth 2 (two) times daily. 06/28/16 07/05/16  Cheri Fowler, PA-C    Family History Family History  Problem Relation Age of Onset  . Heart failure Mother   . Heart disease Mother   . Diabetes Father   . Stroke Brother     Social History Social History  Substance Use Topics  . Smoking status: Current Every Day Smoker    Packs/day: 1.00    Years: 18.00    Types: Cigarettes  . Smokeless tobacco: Never Used    . Alcohol use No     Allergies   Tramadol; Zofran [ondansetron]; and Vicodin [hydrocodone-acetaminophen]   Review of Systems Review of Systems  Constitutional: Negative for chills and fever.  Gastrointestinal: Negative for nausea and vomiting.  Skin: Positive for color change and wound. Negative for rash.  Neurological: Negative for numbness.     Physical Exam Updated Vital Signs BP (!) 147/109 (BP Location: Right Arm)   Pulse 80   Temp 98.1 F (36.7 C) (Oral)   Resp 18   SpO2 99%   Physical Exam  Constitutional: He is oriented to person, place, and time. He appears well-developed and well-nourished.  HENT:  Head: Normocephalic and atraumatic.  Right Ear: External ear normal.  Left Ear: External ear normal.  Eyes: Conjunctivae are normal. No scleral icterus.  Neck: No tracheal deviation present.  Cardiovascular: Intact distal pulses.   Pulses:      Radial pulses are 2+ on the right side, and 2+ on the left side.  Brisk capillary refill.   Pulmonary/Chest: Effort normal. No respiratory distress.  Abdominal: He exhibits no distension.  Musculoskeletal: Normal range of motion.  FAROM of MCP, PIPJ, and DIPJ of left hand without pain.  Neurological: He is alert and oriented to person, place, and time.  Skin: Skin is warm and dry.  Small area of induration, warmth, and erythema over the dorsum of the left third MCP without fluctuance.  There is spreading erythema to the dorsum of the left hand.   Psychiatric: He has a normal mood and affect. His behavior is normal.     ED Treatments / Results  DIAGNOSTIC STUDIES: Oxygen Saturation is 99% on RA, normal by my interpretation.    COORDINATION OF CARE: 5:18 PM- Will consult attending on testing for these recurrent abscesses.    Labs (all labs ordered are listed, but only abnormal results are displayed) Labs Reviewed - No data to display  EKG  EKG Interpretation None       Radiology No results  found.  Procedures Procedures (including critical care time)  Medications Ordered in ED Medications - No data to display   Initial Impression / Assessment and Plan / ED Course  Cheri Fowler, PA-C has reviewed the triage vital signs and the nursing notes.  Pertinent labs & imaging results that were available during my care of the patient were reviewed by me and considered in my medical decision making (see chart for details).  Clinical Course   Patient presents with findings consistent with cellulitis. No evidence of abscess at this time. Therefore, an I&D not indicated. He is full active range of motion of his MCPs, doubt joint involvement. He is afebrile and appears well. No systemic symptoms. Patient has history of MRSA.  Plan to discharge home with Bactrim and Keflex. Follow up with hand surgery. Return precautions discussed. Patient agrees and acknowledges the above plan for discharge.   Final Clinical Impressions(s) / ED Diagnoses   Final diagnoses:  Cellulitis of left upper extremity   I personally performed the services described in this documentation, which was scribed in my presence. The recorded information has been reviewed and is accurate.   New Prescriptions New Prescriptions   CEPHALEXIN (KEFLEX) 500 MG CAPSULE    Take 1 capsule (500 mg total) by mouth 4 (four) times daily.   SULFAMETHOXAZOLE-TRIMETHOPRIM (BACTRIM DS,SEPTRA DS) 800-160 MG TABLET    Take 1 tablet by mouth 2 (two) times daily.     Cheri Fowler, PA-C 06/28/16 1731    Mancel Bale, MD 06/29/16 [phone]

## 2016-06-28 NOTE — ED Notes (Signed)
Patient able to ambulate independently  

## 2016-07-02 ENCOUNTER — Ambulatory Visit (HOSPITAL_BASED_OUTPATIENT_CLINIC_OR_DEPARTMENT_OTHER): Payer: Medicare Other | Attending: Internal Medicine | Admitting: Internal Medicine

## 2016-07-02 VITALS — Ht 71.0 in | Wt 376.0 lb

## 2016-07-02 DIAGNOSIS — G4733 Obstructive sleep apnea (adult) (pediatric): Secondary | ICD-10-CM | POA: Diagnosis not present

## 2016-07-02 DIAGNOSIS — R0683 Snoring: Secondary | ICD-10-CM | POA: Insufficient documentation

## 2016-07-20 DIAGNOSIS — G4733 Obstructive sleep apnea (adult) (pediatric): Secondary | ICD-10-CM | POA: Diagnosis not present

## 2016-07-20 NOTE — Procedures (Signed)
   Patient Name: [name], [name] Study Date: 07/02/2016 Gender: Male D.O.B: [birth year] Age (years): 39 Referring Provider: Fleet ContrasEdwin Avbuere Height (inches): 71 Interpreting Physician: Jetty Duhamellinton Naveya Ellerman MD, ABSM Weight (lbs): 371 RPSGT: Wylie HailDavis, Rico BMI: 52 MRN: [id number] Neck Size: 19.00 CLINICAL INFORMATION Sleep Study Type: NPSG Indication for sleep study: OSA Epworth Sleepiness Score: 3  SLEEP STUDY TECHNIQUE As per the AASM Manual for the Scoring of Sleep and Associated Events v2.3 (April 2016) with a hypopnea requiring 4% desaturations. The channels recorded and monitored were frontal, central and occipital EEG, electrooculogram (EOG), submentalis EMG (chin), nasal and oral airflow, thoracic and abdominal wall motion, anterior tibialis EMG, snore microphone, electrocardiogram, and pulse oximetry.  MEDICATIONS Patient's medications include: charted for reivew. Medications self-administered by patient during sleep study : none.  SLEEP ARCHITECTURE The study was initiated at 10:24:50 PM and ended at 4:51:20 AM. Sleep onset time was 19.9 minutes and the sleep efficiency was 89.1%. The total sleep time was 344.5 minutes. Stage REM latency was 94.5 minutes. The patient spent 5.22% of the night in stage N1 sleep, 79.25% in stage N2 sleep, 0.58% in stage N3 and 14.95% in REM. Alpha intrusion was absent. Supine sleep was 4.12%.  RESPIRATORY PARAMETERS The overall apnea/hypopnea index (AHI) was 4.0 per hour. There were 22 total apneas, including 22 obstructive, 0 central and 0 mixed apneas. There were 1 hypopneas and 11 RERAs. The AHI during Stage REM sleep was 0.0 per hour. AHI while supine was 59.1 per hour. The mean oxygen saturation was 92.77%. The minimum SpO2 during sleep was 87.00%. Loud snoring was noted during this study.  CARDIAC DATA The 2 lead EKG demonstrated sinus rhythm. The mean heart rate was 63.39 beats per minute. Other EKG findings include: None.  LEG MOVEMENT  DATA The total PLMS were 0 with a resulting PLMS index of 0.00. Associated arousal with leg movement index was 0.0 .  IMPRESSIONS - No significant obstructive sleep apnea occurred during this study (AHI = 4.0/h). - No significant central sleep apnea occurred during this study (CAI = 0.0/h). - Mild oxygen desaturation was noted during this study (Min O2 = 87.00%). - The patient snored with Loud snoring volume. - No cardiac abnormalities were noted during this study. - Clinically significant periodic limb movements did not occur during sleep. No significant associated arousals.  DIAGNOSIS - Primary Snoring (786.09 [R06.83 ICD-10]) - Normal study RECOMMENDATIONS - Positional therapy avoiding supine position during sleep. - Avoid alcohol, sedatives and other CNS depressants that may worsen sleep apnea and disrupt normal sleep architecture. - Sleep hygiene should be reviewed to assess factors that may improve sleep quality. - Weight management and regular exercise should be initiated or continued if appropriate.  [Electronically signed] 07/20/2016 09:32 AM  Jetty Duhamellinton Jerilee Space MD, ABSM Diplomate, American Board of Sleep Medicine   NPI: [id number] Waymon BudgeYOUNG,Khadijah Mastrianni D Diplomate, American Board of Sleep Medicine  ELECTRONICALLY SIGNED ON:  07/20/2016, 9:25 AM [facility] PH: [phone]   FX: [phone] ACCREDITED BY THE AMERICAN ACADEMY OF SLEEP MEDICINE

## 2016-08-02 ENCOUNTER — Encounter (HOSPITAL_COMMUNITY): Payer: Self-pay

## 2016-08-02 ENCOUNTER — Ambulatory Visit (HOSPITAL_COMMUNITY)
Admission: RE | Admit: 2016-08-02 | Discharge: 2016-08-02 | Disposition: A | Discharge status: Home / Self Care | Payer: Medicare Other | Source: Ambulatory Visit | Attending: Surgery | Admitting: Surgery

## 2016-08-02 ENCOUNTER — Encounter (HOSPITAL_COMMUNITY)
Admission: RE | Admit: 2016-08-02 | Discharge: 2016-08-02 | Disposition: A | Discharge status: Home / Self Care | Payer: Medicare Other | Source: Ambulatory Visit | Attending: Specialist | Admitting: Specialist

## 2016-08-02 DIAGNOSIS — I10 Essential (primary) hypertension: Secondary | ICD-10-CM | POA: Insufficient documentation

## 2016-08-02 DIAGNOSIS — Z01818 Encounter for other preprocedural examination: Secondary | ICD-10-CM | POA: Insufficient documentation

## 2016-08-02 HISTORY — DX: Attention-deficit hyperactivity disorder, unspecified type: F90.9

## 2016-08-02 HISTORY — DX: Depression, unspecified: F32.A

## 2016-08-02 HISTORY — DX: Anxiety disorder, unspecified: F41.9

## 2016-08-02 HISTORY — DX: Unspecified osteoarthritis, unspecified site: M19.90

## 2016-08-02 HISTORY — DX: Nausea with vomiting, unspecified: R11.2

## 2016-08-02 HISTORY — DX: Major depressive disorder, single episode, unspecified: F32.9

## 2016-08-02 HISTORY — DX: Other specified postprocedural states: Z98.890

## 2016-08-02 LAB — COMPREHENSIVE METABOLIC PANEL
ALT: 17 U/L (ref 17–63)
ANION GAP: 6 (ref 5–15)
AST: 19 U/L (ref 15–41)
Albumin: 3.9 g/dL (ref 3.5–5.0)
Alkaline Phosphatase: 71 U/L (ref 38–126)
BILIRUBIN TOTAL: 0.6 mg/dL (ref 0.3–1.2)
BUN: 18 mg/dL (ref 6–20)
CALCIUM: 9.1 mg/dL (ref 8.9–10.3)
CO2: 25 mmol/L (ref 22–32)
CREATININE: 0.96 mg/dL (ref 0.61–1.24)
Chloride: 105 mmol/L (ref 101–111)
Glucose, Bld: 94 mg/dL (ref 65–99)
POTASSIUM: 4.5 mmol/L (ref 3.5–5.1)
Sodium: 136 mmol/L (ref 135–145)
Total Protein: 6.6 g/dL (ref 6.5–8.1)

## 2016-08-02 LAB — PROTIME-INR
INR: 0.99
Prothrombin Time: 13.1 seconds (ref 11.4–15.2)

## 2016-08-02 LAB — URINALYSIS, ROUTINE W REFLEX MICROSCOPIC
Bilirubin Urine: NEGATIVE
GLUCOSE, UA: NEGATIVE mg/dL
HGB URINE DIPSTICK: NEGATIVE
Ketones, ur: NEGATIVE mg/dL
LEUKOCYTES UA: NEGATIVE
Nitrite: NEGATIVE
Protein, ur: NEGATIVE mg/dL
SPECIFIC GRAVITY, URINE: 1.027 (ref 1.005–1.030)
pH: 5.5 (ref 5.0–8.0)

## 2016-08-02 LAB — TYPE AND SCREEN
ABO/RH(D): B POS
Antibody Screen: NEGATIVE

## 2016-08-02 LAB — SURGICAL PCR SCREEN
MRSA, PCR: NEGATIVE
STAPHYLOCOCCUS AUREUS: NEGATIVE

## 2016-08-02 LAB — CBC
HEMATOCRIT: 42.7 % (ref 39.0–52.0)
Hemoglobin: 14.9 g/dL (ref 13.0–17.0)
MCH: 31.2 pg (ref 26.0–34.0)
MCHC: 34.9 g/dL (ref 30.0–36.0)
MCV: 89.3 fL (ref 78.0–100.0)
PLATELETS: 199 10*3/uL (ref 150–400)
RBC: 4.78 MIL/uL (ref 4.22–5.81)
RDW: 12.5 % (ref 11.5–15.5)
WBC: 8.7 10*3/uL (ref 4.0–10.5)

## 2016-08-02 LAB — APTT: aPTT: 36 seconds (ref 24–36)

## 2016-08-02 LAB — ABO/RH: ABO/RH(D): B POS

## 2016-08-02 NOTE — Progress Notes (Signed)
PCP - Dr. Concepcion [facility]re at [facility] Cardiologist - denies  EKG - 08/02/16 CXR - 08/02/16  Echo/Stress test/Cardiac Cath - denies  Patient denies chest pain and shortness of breath at PAT appointment.

## 2016-08-02 NOTE — Pre-Procedure Instructions (Signed)
[name]  08/02/2016      [facility] [phone] [place] - [address] AT [facility] [address] [place] [address] Phone: [phone] Fax: [phone]  [id number] - [place] - [address] AT [facility] [address] [id number] [place] [address] Phone: [phone] Fax: [phone]    Your procedure is scheduled on Tuesday, September 12th, 2017.  [facility] at [facility]:30 [facility].M.  Call this number if you have problems the morning of surgery:  [phone]   Remember:  Do not eat food or drink liquids after midnight.   Take these medicines the morning of surgery with A SIP OF WATER: Diazepam (Valium), Duloxetine (Cymbalta), Gabapentin (Neurontin), Hydrocodone-acetaminophen (Norco) if needed, Metoprolol Tartrate (Lopressor).  Stop taking: Meloxicam (Mobic), Aspirin, NSAIDS, Aleve, Naproxen, Ibuprofen, Advil, Motrin, BC's, Goody's, fish oil, all herbal medications, and all vitamins.    Do not wear jewelry.  Do not wear lotions, powders, or colognes, or deoderant.  Men may shave face and neck.  Do not bring valuables to the hospital.  [facility] is not responsible for any belongings or valuables.  Contacts, dentures or bridgework may not be worn into surgery.  Leave your suitcase in the car.  After surgery it may be brought to your room.  For patients admitted to the hospital, discharge time will be determined by your treatment team.  Patients discharged the day of surgery will not be allowed to drive home.   Special instructions:  Preparing for Surgery.   Please read over the following fact sheets that you were given. MRSA Information    [facility]- Preparing For Surgery  Before surgery, you can play an important role. Because skin is not sterile, your skin needs to be as free of germs as possible. You can reduce the number of  germs on your skin by washing with CHG (chlorahexidine gluconate) Soap before surgery.  CHG is an antiseptic cleaner which kills germs and bonds with the skin to continue killing germs even after washing.  Please do not use if you have an allergy to CHG or antibacterial soaps. If your skin becomes reddened/irritated stop using the CHG.  Do not shave (including legs and underarms) for at least 48 hours prior to first CHG shower. It is OK to shave your face.  Please follow these instructions carefully.   1. Shower the NIGHT BEFORE SURGERY and the MORNING OF SURGERY with CHG.   2. If you chose to wash your hair, wash your hair first as usual with your normal shampoo.  3. After you shampoo, rinse your hair and body thoroughly to remove the shampoo.  4. Use CHG as you would any other liquid soap. You can apply CHG directly to the skin and wash gently with a scrungie or a clean washcloth.   5. Apply the CHG Soap to your body ONLY FROM THE NECK DOWN.  Do not use on open wounds or open sores. Avoid contact with your eyes, ears, mouth and genitals (private parts). Wash genitals (private parts) with your normal soap.  6. Wash thoroughly, paying special attention to the area where your surgery will be performed.  7. Thoroughly rinse your body with warm water from the neck down.  8. DO NOT shower/wash with your normal soap after using and rinsing off the CHG Soap.  9. Pat yourself dry with a CLEAN  TOWEL.   10. Wear CLEAN PAJAMAS   11. Place CLEAN SHEETS on your bed the night of your first shower and DO NOT SLEEP WITH PETS.  Day of Surgery: Do not apply any deodorants/lotions. Please wear clean clothes to the hospital/surgery center.

## 2016-08-05 ENCOUNTER — Encounter (HOSPITAL_COMMUNITY): Payer: Self-pay | Admitting: Anesthesiology

## 2016-08-05 MED ORDER — DEXTROSE 5 % IV SOLN
3.0000 g | INTRAVENOUS | Status: AC
  Administered 2016-08-06 (×2): 3 g via INTRAVENOUS
  Filled 2016-08-05: qty 3000

## 2016-08-05 NOTE — Anesthesia Preprocedure Evaluation (Addendum)
Anesthesia Evaluation  Patient identified by MRN, date of birth, ID band Patient awake    Reviewed: Allergy & Precautions, NPO status , Patient's Chart, lab work & pertinent test results, reviewed documented beta blocker date and time   History of Anesthesia Complications (+) PONV and history of anesthetic complications  Airway Mallampati: III  TM Distance: >3 FB Neck ROM: Full    Dental no notable dental hx. (+) Teeth Intact, Dental Advisory Given   Pulmonary Current Smoker,  Snores-Normal sleep study   Pulmonary exam normal breath sounds clear to auscultation       Cardiovascular hypertension, Pt. on medications and Pt. on home beta blockers Normal cardiovascular exam Rhythm:Regular Rate:Normal     Neuro/Psych PSYCHIATRIC DISORDERS Anxiety Depression ADHDPeripheral neuropathy    GI/Hepatic Neg liver ROS, GERD  Medicated and Controlled,  Endo/Other  Morbid obesity  Renal/GU negative Renal ROS  negative genitourinary   Musculoskeletal  (+) Arthritis , Chronic LBP Lumbar stenosis   Abdominal (+) + obese,   Peds  Hematology negative hematology ROS (+)   Anesthesia Other Findings   Reproductive/Obstetrics                          Lab Results  Component Value Date   WBC 8.7 08/02/2016   HGB 14.9 08/02/2016   HCT 42.7 08/02/2016   MCV 89.3 08/02/2016   PLT 199 08/02/2016     Chemistry      Component Value Date/Time   NA 136 08/02/2016 1429   K 4.5 08/02/2016 1429   CL 105 08/02/2016 1429   CO2 25 08/02/2016 1429   BUN 18 08/02/2016 1429   CREATININE 0.96 08/02/2016 1429   CREATININE 0.85 03/09/2014 1717      Component Value Date/Time   CALCIUM 9.1 08/02/2016 1429   ALKPHOS 71 08/02/2016 1429   AST 19 08/02/2016 1429   ALT 17 08/02/2016 1429   BILITOT 0.6 08/02/2016 1429     EKG: normal EKG, normal sinus rhythm, unchanged from previous tracings.  Anesthesia  Physical Anesthesia Plan  ASA: III  Anesthesia Plan: General   Post-op Pain Management:    Induction: Intravenous  Airway Management Planned: Oral ETT  Additional Equipment:   Intra-op Plan:   Post-operative Plan: Extubation in OR  Informed Consent: I have reviewed the patients History and Physical, chart, labs and discussed the procedure including the risks, benefits and alternatives for the proposed anesthesia with the patient or authorized representative who has indicated his/her understanding and acceptance.   Dental advisory given  Plan Discussed with: CRNA, Anesthesiologist and Surgeon  Anesthesia Plan Comments:         Anesthesia Quick Evaluation

## 2016-08-06 ENCOUNTER — Inpatient Hospital Stay (HOSPITAL_COMMUNITY): Payer: Medicare Other | Admitting: Anesthesiology

## 2016-08-06 ENCOUNTER — Encounter (HOSPITAL_COMMUNITY): Payer: Self-pay | Admitting: Certified Registered Nurse Anesthetist

## 2016-08-06 ENCOUNTER — Inpatient Hospital Stay (HOSPITAL_COMMUNITY): Payer: Medicare Other

## 2016-08-06 ENCOUNTER — Inpatient Hospital Stay (HOSPITAL_COMMUNITY)
Admission: RE | Admit: 2016-08-06 | Discharge: 2016-08-09 | DRG: 460 | Disposition: A | Discharge status: Home Health | Payer: Medicare Other | Source: Ambulatory Visit | Attending: Specialist | Admitting: Specialist

## 2016-08-06 ENCOUNTER — Encounter (HOSPITAL_COMMUNITY)
Admission: RE | Disposition: A | Discharge status: Home / Self Care | Payer: Self-pay | Source: Ambulatory Visit | Attending: Specialist

## 2016-08-06 DIAGNOSIS — M5116 Intervertebral disc disorders with radiculopathy, lumbar region: Secondary | ICD-10-CM | POA: Diagnosis present

## 2016-08-06 DIAGNOSIS — G629 Polyneuropathy, unspecified: Secondary | ICD-10-CM | POA: Diagnosis present

## 2016-08-06 DIAGNOSIS — I10 Essential (primary) hypertension: Secondary | ICD-10-CM | POA: Diagnosis present

## 2016-08-06 DIAGNOSIS — M48062 Spinal stenosis, lumbar region with neurogenic claudication: Secondary | ICD-10-CM | POA: Diagnosis present

## 2016-08-06 DIAGNOSIS — Z7982 Long term (current) use of aspirin: Secondary | ICD-10-CM

## 2016-08-06 DIAGNOSIS — Z6841 Body Mass Index (BMI) 40.0 and over, adult: Secondary | ICD-10-CM | POA: Diagnosis not present

## 2016-08-06 DIAGNOSIS — M51369 Other intervertebral disc degeneration, lumbar region without mention of lumbar back pain or lower extremity pain: Secondary | ICD-10-CM | POA: Diagnosis present

## 2016-08-06 DIAGNOSIS — Z791 Long term (current) use of non-steroidal anti-inflammatories (NSAID): Secondary | ICD-10-CM

## 2016-08-06 DIAGNOSIS — M4806 Spinal stenosis, lumbar region: Secondary | ICD-10-CM | POA: Diagnosis present

## 2016-08-06 DIAGNOSIS — K219 Gastro-esophageal reflux disease without esophagitis: Secondary | ICD-10-CM | POA: Diagnosis present

## 2016-08-06 DIAGNOSIS — Z419 Encounter for procedure for purposes other than remedying health state, unspecified: Secondary | ICD-10-CM

## 2016-08-06 DIAGNOSIS — F909 Attention-deficit hyperactivity disorder, unspecified type: Secondary | ICD-10-CM | POA: Diagnosis present

## 2016-08-06 DIAGNOSIS — F17210 Nicotine dependence, cigarettes, uncomplicated: Secondary | ICD-10-CM | POA: Diagnosis present

## 2016-08-06 DIAGNOSIS — M5136 Other intervertebral disc degeneration, lumbar region: Secondary | ICD-10-CM | POA: Diagnosis present

## 2016-08-06 SURGERY — POSTERIOR LUMBAR FUSION 2 LEVEL
Anesthesia: General | Site: Spine Lumbar

## 2016-08-06 MED ORDER — GLYCOPYRROLATE 0.2 MG/ML IJ SOLN
INTRAMUSCULAR | Status: DC | PRN
  Administered 2016-08-06: 0.2 mg via INTRAVENOUS

## 2016-08-06 MED ORDER — SUGAMMADEX SODIUM 500 MG/5ML IV SOLN
INTRAVENOUS | Status: AC
  Filled 2016-08-06: qty 5

## 2016-08-06 MED ORDER — ROCURONIUM BROMIDE 10 MG/ML (PF) SYRINGE
PREFILLED_SYRINGE | INTRAVENOUS | Status: AC
  Filled 2016-08-06: qty 10

## 2016-08-06 MED ORDER — POLYETHYLENE GLYCOL 3350 17 G PO PACK
17.0000 g | PACK | Freq: Every day | ORAL | Status: DC | PRN
  Administered 2016-08-08: 17 g via ORAL
  Filled 2016-08-06: qty 1

## 2016-08-06 MED ORDER — SCOPOLAMINE 1 MG/3DAYS TD PT72
1.0000 | MEDICATED_PATCH | TRANSDERMAL | Status: DC
  Administered 2016-08-06: 1.5 mg via TRANSDERMAL
  Filled 2016-08-06: qty 1

## 2016-08-06 MED ORDER — ONDANSETRON HCL 4 MG/2ML IJ SOLN
INTRAMUSCULAR | Status: AC
  Filled 2016-08-06: qty 2

## 2016-08-06 MED ORDER — MENTHOL 3 MG MT LOZG: 1.0000 | LOZENGE | OROMUCOSAL | Status: DC | PRN

## 2016-08-06 MED ORDER — SURGIFOAM 100 EX MISC
CUTANEOUS | Status: DC | PRN
  Administered 2016-08-06: 20 mL via TOPICAL

## 2016-08-06 MED ORDER — BUPIVACAINE HCL (PF) 0.5 % IJ SOLN
INTRAMUSCULAR | Status: AC
  Filled 2016-08-06: qty 30

## 2016-08-06 MED ORDER — PROPOFOL 10 MG/ML IV BOLUS
INTRAVENOUS | Status: AC
  Filled 2016-08-06: qty 40

## 2016-08-06 MED ORDER — METHOCARBAMOL 1000 MG/10ML IJ SOLN
500.0000 mg | Freq: Four times a day (QID) | INTRAMUSCULAR | Status: DC | PRN
  Filled 2016-08-06: qty 5

## 2016-08-06 MED ORDER — OXYCODONE-ACETAMINOPHEN 5-325 MG PO TABS
1.0000 | ORAL_TABLET | ORAL | Status: DC | PRN
  Administered 2016-08-06 – 2016-08-09 (×14): 2 via ORAL
  Filled 2016-08-06 (×16): qty 2

## 2016-08-06 MED ORDER — METOCLOPRAMIDE HCL 5 MG/ML IJ SOLN
INTRAMUSCULAR | Status: DC | PRN
  Administered 2016-08-06: 10 mg via INTRAVENOUS

## 2016-08-06 MED ORDER — LACTATED RINGERS IV SOLN
INTRAVENOUS | Status: DC | PRN
  Administered 2016-08-06 (×5): via INTRAVENOUS

## 2016-08-06 MED ORDER — GABAPENTIN 800 MG PO TABS
800.0000 mg | ORAL_TABLET | Freq: Two times a day (BID) | ORAL | Status: DC
  Filled 2016-08-06: qty 1

## 2016-08-06 MED ORDER — ONDANSETRON HCL 4 MG/2ML IJ SOLN: 4.0000 mg | INTRAMUSCULAR | Status: DC | PRN

## 2016-08-06 MED ORDER — DOCUSATE SODIUM 100 MG PO CAPS
100.0000 mg | ORAL_CAPSULE | Freq: Two times a day (BID) | ORAL | Status: DC
  Administered 2016-08-06 – 2016-08-09 (×6): 100 mg via ORAL
  Filled 2016-08-06 (×6): qty 1

## 2016-08-06 MED ORDER — ALBUTEROL SULFATE HFA 108 (90 BASE) MCG/ACT IN AERS
INHALATION_SPRAY | RESPIRATORY_TRACT | Status: AC
  Filled 2016-08-06: qty 6.7

## 2016-08-06 MED ORDER — FENTANYL CITRATE (PF) 100 MCG/2ML IJ SOLN
INTRAMUSCULAR | Status: AC
  Filled 2016-08-06: qty 2

## 2016-08-06 MED ORDER — FLEET ENEMA 7-19 GM/118ML RE ENEM
1.0000 | ENEMA | Freq: Once | RECTAL | Status: DC | PRN
  Filled 2016-08-06: qty 1

## 2016-08-06 MED ORDER — SUGAMMADEX SODIUM 200 MG/2ML IV SOLN
INTRAVENOUS | Status: DC | PRN
  Administered 2016-08-06: 700 mg via INTRAVENOUS

## 2016-08-06 MED ORDER — ACETAMINOPHEN 325 MG PO TABS: 650.0000 mg | ORAL_TABLET | ORAL | Status: DC | PRN

## 2016-08-06 MED ORDER — SUGAMMADEX SODIUM 200 MG/2ML IV SOLN
INTRAVENOUS | Status: AC
  Filled 2016-08-06: qty 2

## 2016-08-06 MED ORDER — ASPIRIN EC 81 MG PO TBEC
81.0000 mg | DELAYED_RELEASE_TABLET | Freq: Every day | ORAL | Status: DC
  Administered 2016-08-06 – 2016-08-09 (×4): 81 mg via ORAL
  Filled 2016-08-06 (×4): qty 1

## 2016-08-06 MED ORDER — DEXMEDETOMIDINE HCL IN NACL 200 MCG/50ML IV SOLN
INTRAVENOUS | Status: AC
Start: 2016-08-06 — End: 2016-08-06
  Filled 2016-08-06: qty 50

## 2016-08-06 MED ORDER — DEXAMETHASONE SODIUM PHOSPHATE 10 MG/ML IJ SOLN
INTRAMUSCULAR | Status: AC
  Filled 2016-08-06: qty 1

## 2016-08-06 MED ORDER — PROPOFOL 10 MG/ML IV BOLUS
INTRAVENOUS | Status: DC | PRN
Start: 2016-08-06 — End: 2016-08-06
  Administered 2016-08-06: 50 mg via INTRAVENOUS
  Administered 2016-08-06: 200 mg via INTRAVENOUS

## 2016-08-06 MED ORDER — BUPIVACAINE HCL 0.5 % IJ SOLN
INTRAMUSCULAR | Status: DC | PRN
  Administered 2016-08-06: 30 mL

## 2016-08-06 MED ORDER — ROCURONIUM BROMIDE 100 MG/10ML IV SOLN
INTRAVENOUS | Status: DC | PRN
  Administered 2016-08-06 (×2): 10 mg via INTRAVENOUS
  Administered 2016-08-06 (×2): 50 mg via INTRAVENOUS
  Administered 2016-08-06: 10 mg via INTRAVENOUS
  Administered 2016-08-06: 30 mg via INTRAVENOUS
  Administered 2016-08-06: 50 mg via INTRAVENOUS

## 2016-08-06 MED ORDER — FENTANYL CITRATE (PF) 100 MCG/2ML IJ SOLN
INTRAMUSCULAR | Status: DC | PRN
  Administered 2016-08-06 (×8): 100 ug via INTRAVENOUS

## 2016-08-06 MED ORDER — BISACODYL 5 MG PO TBEC
5.0000 mg | DELAYED_RELEASE_TABLET | Freq: Every day | ORAL | Status: DC | PRN
  Administered 2016-08-08: 5 mg via ORAL
  Filled 2016-08-06: qty 1

## 2016-08-06 MED ORDER — MIDAZOLAM HCL 2 MG/2ML IJ SOLN
INTRAMUSCULAR | Status: AC
  Filled 2016-08-06: qty 2

## 2016-08-06 MED ORDER — THROMBIN 20000 UNITS EX SOLR
CUTANEOUS | Status: AC
  Filled 2016-08-06: qty 20000

## 2016-08-06 MED ORDER — EPHEDRINE 5 MG/ML INJ
INTRAVENOUS | Status: AC
  Filled 2016-08-06: qty 10

## 2016-08-06 MED ORDER — GABAPENTIN 400 MG PO CAPS
800.0000 mg | ORAL_CAPSULE | Freq: Two times a day (BID) | ORAL | Status: DC
  Administered 2016-08-06 – 2016-08-08 (×5): 800 mg via ORAL
  Filled 2016-08-06 (×5): qty 2

## 2016-08-06 MED ORDER — DIMENHYDRINATE 50 MG PO TABS
50.0000 mg | ORAL_TABLET | Freq: Three times a day (TID) | ORAL | Status: DC | PRN
  Filled 2016-08-06: qty 1

## 2016-08-06 MED ORDER — FENTANYL CITRATE (PF) 100 MCG/2ML IJ SOLN
INTRAMUSCULAR | Status: AC
  Filled 2016-08-06: qty 8

## 2016-08-06 MED ORDER — FENTANYL CITRATE (PF) 100 MCG/2ML IJ SOLN
INTRAMUSCULAR | Status: AC
  Filled 2016-08-06: qty 4

## 2016-08-06 MED ORDER — SUCCINYLCHOLINE CHLORIDE 200 MG/10ML IV SOSY
PREFILLED_SYRINGE | INTRAVENOUS | Status: AC
  Filled 2016-08-06: qty 10

## 2016-08-06 MED ORDER — HYDROMORPHONE HCL 1 MG/ML IJ SOLN
INTRAMUSCULAR | Status: AC
  Filled 2016-08-06: qty 1

## 2016-08-06 MED ORDER — AMPHETAMINE-DEXTROAMPHET ER 10 MG PO CP24
30.0000 mg | ORAL_CAPSULE | Freq: Every day | ORAL | Status: DC
  Administered 2016-08-07 – 2016-08-09 (×3): 30 mg via ORAL
  Filled 2016-08-06 (×3): qty 3

## 2016-08-06 MED ORDER — LACTATED RINGERS IV SOLN
INTRAVENOUS | Status: DC | PRN
  Administered 2016-08-06: 09:00:00 via INTRAVENOUS

## 2016-08-06 MED ORDER — MORPHINE SULFATE 15 MG PO TABS: 15.0000 mg | ORAL_TABLET | ORAL | Status: DC | PRN

## 2016-08-06 MED ORDER — 0.9 % SODIUM CHLORIDE (POUR BTL) OPTIME
TOPICAL | Status: DC | PRN
  Administered 2016-08-06: 1000 mL

## 2016-08-06 MED ORDER — NICOTINE 21 MG/24HR TD PT24
21.0000 mg | MEDICATED_PATCH | Freq: Every day | TRANSDERMAL | Status: DC
  Administered 2016-08-07: 21 mg via TRANSDERMAL
  Filled 2016-08-06 (×3): qty 1

## 2016-08-06 MED ORDER — KETOROLAC TROMETHAMINE 30 MG/ML IJ SOLN
30.0000 mg | Freq: Once | INTRAMUSCULAR | Status: AC
  Administered 2016-08-06: 30 mg via INTRAVENOUS
  Filled 2016-08-06: qty 1

## 2016-08-06 MED ORDER — LIDOCAINE 2% (20 MG/ML) 5 ML SYRINGE
INTRAMUSCULAR | Status: AC
  Filled 2016-08-06: qty 5

## 2016-08-06 MED ORDER — MIDAZOLAM HCL 5 MG/5ML IJ SOLN
INTRAMUSCULAR | Status: DC | PRN
  Administered 2016-08-06: 2 mg via INTRAVENOUS

## 2016-08-06 MED ORDER — GEMFIBROZIL 600 MG PO TABS
600.0000 mg | ORAL_TABLET | Freq: Two times a day (BID) | ORAL | Status: DC
  Administered 2016-08-06 – 2016-08-09 (×6): 600 mg via ORAL
  Filled 2016-08-06 (×7): qty 1

## 2016-08-06 MED ORDER — PHENYLEPHRINE HCL 10 MG/ML IJ SOLN
INTRAMUSCULAR | Status: DC | PRN
  Administered 2016-08-06 (×4): 80 ug via INTRAVENOUS

## 2016-08-06 MED ORDER — SODIUM CHLORIDE 0.9 % IV SOLN: 250.0000 mL | INTRAVENOUS | Status: DC

## 2016-08-06 MED ORDER — DEXMEDETOMIDINE HCL 200 MCG/2ML IV SOLN
INTRAVENOUS | Status: DC | PRN
  Administered 2016-08-06 (×3): 16 ug via INTRAVENOUS

## 2016-08-06 MED ORDER — ACETAMINOPHEN 650 MG RE SUPP: 650.0000 mg | RECTAL | Status: DC | PRN

## 2016-08-06 MED ORDER — SODIUM CHLORIDE 0.9% FLUSH: 3.0000 mL | INTRAVENOUS | Status: DC | PRN

## 2016-08-06 MED ORDER — HEMOSTATIC AGENTS (NO CHARGE) OPTIME
TOPICAL | Status: DC | PRN
  Administered 2016-08-06: 1 via TOPICAL

## 2016-08-06 MED ORDER — ALBUMIN HUMAN 5 % IV SOLN
INTRAVENOUS | Status: DC | PRN
  Administered 2016-08-06 (×3): via INTRAVENOUS

## 2016-08-06 MED ORDER — HYDROCODONE-ACETAMINOPHEN 5-325 MG PO TABS
1.0000 | ORAL_TABLET | ORAL | Status: DC | PRN
  Administered 2016-08-07: 2 via ORAL
  Filled 2016-08-06: qty 2

## 2016-08-06 MED ORDER — PHENYLEPHRINE 40 MCG/ML (10ML) SYRINGE FOR IV PUSH (FOR BLOOD PRESSURE SUPPORT)
PREFILLED_SYRINGE | INTRAVENOUS | Status: AC
  Filled 2016-08-06: qty 10

## 2016-08-06 MED ORDER — LIDOCAINE HCL (CARDIAC) 20 MG/ML IV SOLN
INTRAVENOUS | Status: DC | PRN
  Administered 2016-08-06: 100 mg via INTRAVENOUS

## 2016-08-06 MED ORDER — MEPERIDINE HCL 25 MG/ML IJ SOLN: 6.2500 mg | INTRAMUSCULAR | Status: DC | PRN

## 2016-08-06 MED ORDER — BUPIVACAINE LIPOSOME 1.3 % IJ SUSP
20.0000 mL | INTRAMUSCULAR | Status: AC
  Administered 2016-08-06: 20 mL
  Filled 2016-08-06: qty 20

## 2016-08-06 MED ORDER — ONDANSETRON HCL 4 MG/2ML IJ SOLN
INTRAMUSCULAR | Status: DC | PRN
  Administered 2016-08-06: 4 mg via INTRAVENOUS

## 2016-08-06 MED ORDER — METHOCARBAMOL 500 MG PO TABS
500.0000 mg | ORAL_TABLET | Freq: Four times a day (QID) | ORAL | Status: DC | PRN
  Administered 2016-08-06 – 2016-08-09 (×8): 500 mg via ORAL
  Filled 2016-08-06 (×8): qty 1

## 2016-08-06 MED ORDER — DULOXETINE HCL 60 MG PO CPEP
60.0000 mg | ORAL_CAPSULE | Freq: Every day | ORAL | Status: DC
  Administered 2016-08-07 – 2016-08-09 (×3): 60 mg via ORAL
  Filled 2016-08-06 (×3): qty 1

## 2016-08-06 MED ORDER — HYDROMORPHONE HCL 1 MG/ML IJ SOLN
0.2500 mg | INTRAMUSCULAR | Status: DC | PRN
  Administered 2016-08-06 (×2): 0.5 mg via INTRAVENOUS

## 2016-08-06 MED ORDER — MORPHINE SULFATE (PF) 2 MG/ML IV SOLN
1.0000 mg | INTRAVENOUS | Status: DC | PRN
  Administered 2016-08-07 (×2): 4 mg via INTRAVENOUS
  Administered 2016-08-07 (×3): 2 mg via INTRAVENOUS
  Filled 2016-08-06: qty 1
  Filled 2016-08-06: qty 2
  Filled 2016-08-06: qty 1
  Filled 2016-08-06: qty 2
  Filled 2016-08-06: qty 1

## 2016-08-06 MED ORDER — SODIUM CHLORIDE 0.9 % IV SOLN
INTRAVENOUS | Status: DC
  Administered 2016-08-06: 20:00:00 via INTRAVENOUS

## 2016-08-06 MED ORDER — DEXTROSE 5 % IV SOLN
INTRAVENOUS | Status: DC | PRN
  Administered 2016-08-06: 20 ug/min via INTRAVENOUS

## 2016-08-06 MED ORDER — ALBUTEROL SULFATE HFA 108 (90 BASE) MCG/ACT IN AERS
INHALATION_SPRAY | RESPIRATORY_TRACT | Status: DC | PRN
  Administered 2016-08-06: 6 via RESPIRATORY_TRACT

## 2016-08-06 MED ORDER — CEFAZOLIN IN D5W 1 GM/50ML IV SOLN
1.0000 g | Freq: Three times a day (TID) | INTRAVENOUS | Status: AC
  Administered 2016-08-06 – 2016-08-07 (×2): 1 g via INTRAVENOUS
  Filled 2016-08-06 (×2): qty 50

## 2016-08-06 MED ORDER — EPHEDRINE SULFATE 50 MG/ML IJ SOLN
INTRAMUSCULAR | Status: DC | PRN
  Administered 2016-08-06 (×2): 10 mg via INTRAVENOUS
  Administered 2016-08-06: 20 mg via INTRAVENOUS

## 2016-08-06 MED ORDER — SODIUM CHLORIDE 0.9% FLUSH
3.0000 mL | Freq: Two times a day (BID) | INTRAVENOUS | Status: DC
  Administered 2016-08-06 – 2016-08-09 (×6): 3 mL via INTRAVENOUS

## 2016-08-06 MED ORDER — PHENOL 1.4 % MT LIQD: 1.0000 | OROMUCOSAL | Status: DC | PRN

## 2016-08-06 MED ORDER — ALUM & MAG HYDROXIDE-SIMETH 200-200-20 MG/5ML PO SUSP: 30.0000 mL | Freq: Four times a day (QID) | ORAL | Status: DC | PRN

## 2016-08-06 MED ORDER — CHLORHEXIDINE GLUCONATE 4 % EX LIQD: 60.0000 mL | Freq: Once | CUTANEOUS | Status: DC

## 2016-08-06 MED ORDER — SUCCINYLCHOLINE CHLORIDE 20 MG/ML IJ SOLN
INTRAMUSCULAR | Status: DC | PRN
  Administered 2016-08-06: 160 mg via INTRAVENOUS

## 2016-08-06 MED ORDER — METOCLOPRAMIDE HCL 5 MG/ML IJ SOLN
INTRAMUSCULAR | Status: AC
  Filled 2016-08-06: qty 2

## 2016-08-06 MED ORDER — DEXAMETHASONE SODIUM PHOSPHATE 4 MG/ML IJ SOLN
INTRAMUSCULAR | Status: DC | PRN
  Administered 2016-08-06: 10 mg via INTRAVENOUS

## 2016-08-06 MED ORDER — METOPROLOL TARTRATE 25 MG PO TABS
25.0000 mg | ORAL_TABLET | Freq: Two times a day (BID) | ORAL | Status: DC
  Administered 2016-08-06 – 2016-08-09 (×6): 25 mg via ORAL
  Filled 2016-08-06 (×6): qty 1

## 2016-08-06 MED ORDER — PANTOPRAZOLE SODIUM 40 MG PO TBEC
40.0000 mg | DELAYED_RELEASE_TABLET | Freq: Every day | ORAL | Status: DC
  Administered 2016-08-07 – 2016-08-09 (×3): 40 mg via ORAL
  Filled 2016-08-06 (×3): qty 1

## 2016-08-06 MED ORDER — GLYCOPYRROLATE 0.2 MG/ML IV SOSY
PREFILLED_SYRINGE | INTRAVENOUS | Status: AC
  Filled 2016-08-06: qty 3

## 2016-08-06 MED ORDER — METOCLOPRAMIDE HCL 5 MG/ML IJ SOLN
10.0000 mg | Freq: Once | INTRAMUSCULAR | Status: DC | PRN
Start: 2016-08-06 — End: 2016-08-06

## 2016-08-06 MED FILL — Sodium Chloride IV Soln 0.9%: INTRAVENOUS | Qty: 3000 | Status: AC

## 2016-08-06 MED FILL — Heparin Sodium (Porcine) Inj 1000 Unit/ML: INTRAMUSCULAR | Qty: 30 | Status: AC

## 2016-08-06 SURGICAL SUPPLY — 87 items
ADH SKN CLS APL DERMABOND .7 (GAUZE/BANDAGES/DRESSINGS) ×1
APL SKNCLS STERI-STRIP NONHPOA (GAUZE/BANDAGES/DRESSINGS) ×1
BENZOIN TINCTURE PRP APPL 2/3 (GAUZE/BANDAGES/DRESSINGS) ×2 IMPLANT
BLADE SURG ROTATE 9660 (MISCELLANEOUS) ×2 IMPLANT
BONE CANC CHIPS 20CC PCAN1/4 (Bone Implant) ×3 IMPLANT
BONE MATRIX VIVIGEN 5CC (Bone Implant) ×3 IMPLANT
BUR MATCHSTICK NEURO 3.0 LAGG (BURR) ×3 IMPLANT
BUR RND FLUTED 2.5 (BURR) ×2 IMPLANT
BUR SABER RD CUTTING 3.0 (BURR) IMPLANT
BUR SABER RD CUTTING 3.0MM (BURR)
CAGE CONCORDE BULLET 11X12X27 (Cage) ×3 IMPLANT
CAGE SPNL 5D BLT NOSE 27X11X12 (Cage) IMPLANT
CHIPS CANC BONE 20CC PCAN1/4 (Bone Implant) ×1 IMPLANT
CLOSURE STERI-STRIP 1/2X4 (GAUZE/BANDAGES/DRESSINGS) ×1
CLSR STERI-STRIP ANTIMIC 1/2X4 (GAUZE/BANDAGES/DRESSINGS) ×1 IMPLANT
CONNECTOR CROSS SFX A2 (Connector) ×2 IMPLANT
COVER MAYO STAND STRL (DRAPES) ×3 IMPLANT
COVER SURGICAL LIGHT HANDLE (MISCELLANEOUS) ×3 IMPLANT
DERMABOND ADVANCED (GAUZE/BANDAGES/DRESSINGS) ×2
DERMABOND ADVANCED .7 DNX12 (GAUZE/BANDAGES/DRESSINGS) ×1 IMPLANT
DRAPE C-ARM 42X72 X-RAY (DRAPES) ×6 IMPLANT
DRAPE C-ARMOR (DRAPES) ×3 IMPLANT
DRAPE MICROSCOPE LEICA (MISCELLANEOUS) ×5 IMPLANT
DRAPE SURG 17X23 STRL (DRAPES) ×9 IMPLANT
DRAPE TABLE COVER HEAVY DUTY (DRAPES) ×3 IMPLANT
DRSG MEPILEX BORDER 4X4 (GAUZE/BANDAGES/DRESSINGS) IMPLANT
DRSG MEPILEX BORDER 4X8 (GAUZE/BANDAGES/DRESSINGS) ×2 IMPLANT
DURAPREP 26ML APPLICATOR (WOUND CARE) ×3 IMPLANT
ELECT BLADE 4.0 EZ CLEAN MEGAD (MISCELLANEOUS) ×3
ELECT BLADE 6.5 EXT (BLADE) IMPLANT
ELECT CAUTERY BLADE 6.4 (BLADE) ×3 IMPLANT
ELECT REM PT RETURN 9FT ADLT (ELECTROSURGICAL) ×3
ELECTRODE BLDE 4.0 EZ CLN MEGD (MISCELLANEOUS) IMPLANT
ELECTRODE REM PT RTRN 9FT ADLT (ELECTROSURGICAL) ×1 IMPLANT
EVACUATOR 1/8 PVC DRAIN (DRAIN) IMPLANT
GLOVE BIOGEL PI IND STRL 8 (GLOVE) ×1 IMPLANT
GLOVE BIOGEL PI INDICATOR 8 (GLOVE) ×2
GLOVE ECLIPSE 9.0 STRL (GLOVE) ×3 IMPLANT
GLOVE ORTHO TXT STRL SZ7.5 (GLOVE) ×3 IMPLANT
GLOVE SURG 8.5 LATEX PF (GLOVE) ×3 IMPLANT
GLOVE SURG SS PI 6.5 STRL IVOR (GLOVE) ×4 IMPLANT
GOWN STRL REUS W/ TWL LRG LVL3 (GOWN DISPOSABLE) ×1 IMPLANT
GOWN STRL REUS W/TWL 2XL LVL3 (GOWN DISPOSABLE) ×6 IMPLANT
GOWN STRL REUS W/TWL LRG LVL3 (GOWN DISPOSABLE) ×3
GRAFT BNE CANC CHIPS 1-8 20CC (Bone Implant) IMPLANT
GRAFT BNE MATRIX VG 5 (Bone Implant) IMPLANT
KIT BASIN OR (CUSTOM PROCEDURE TRAY) ×3 IMPLANT
KIT POSITION SURG JACKSON T1 (MISCELLANEOUS) ×3 IMPLANT
KIT ROOM TURNOVER OR (KITS) ×3 IMPLANT
MANIFOLD NEPTUNE II (INSTRUMENTS) ×3 IMPLANT
NDL ASP BONE MRW 11GX15 (NEEDLE) IMPLANT
NDL SPNL 18GX3.5 QUINCKE PK (NEEDLE) ×1 IMPLANT
NEEDLE 22X1 1/2 (OR ONLY) (NEEDLE) ×3 IMPLANT
NEEDLE ASP BONE MRW 11GX15 (NEEDLE) IMPLANT
NEEDLE BONE MARROW 8GAX6 (NEEDLE) IMPLANT
NEEDLE SPNL 18GX3.5 QUINCKE PK (NEEDLE) ×3 IMPLANT
NS IRRIG 1000ML POUR BTL (IV SOLUTION) ×3 IMPLANT
PACK LAMINECTOMY ORTHO (CUSTOM PROCEDURE TRAY) ×3 IMPLANT
PAD ARMBOARD 7.5X6 YLW CONV (MISCELLANEOUS) ×6 IMPLANT
PATTIES SURGICAL .75X.75 (GAUZE/BANDAGES/DRESSINGS) ×4 IMPLANT
PATTIES SURGICAL 1X1 (DISPOSABLE) ×3 IMPLANT
ROD EXPEDIUM PRE BENT 5.5X75 (Rod) ×4 IMPLANT
ROD EXPEDIUM PREBENT 85MM (Rod) ×2 IMPLANT
SCREW CORTICAL VIPER 7X40MM (Screw) ×10 IMPLANT
SCREW CORTICAL VIPER 7X45MM (Screw) ×2 IMPLANT
SCREW SET SINGLE INNER (Screw) ×12 IMPLANT
SHAFT TORQUE X25 (MISCELLANEOUS) ×2 IMPLANT
SPACER TPAL 10X10X28MM (Spacer) ×2 IMPLANT
SPONGE LAP 4X18 X RAY DECT (DISPOSABLE) ×4 IMPLANT
SPONGE SURGIFOAM ABS GEL 100 (HEMOSTASIS) ×3 IMPLANT
SURGIFLO W/THROMBIN 8M KIT (HEMOSTASIS) ×4 IMPLANT
SUT VIC AB 0 CT1 27 (SUTURE) ×6
SUT VIC AB 0 CT1 27XBRD ANBCTR (SUTURE) ×1 IMPLANT
SUT VIC AB 1 CTX 36 (SUTURE) ×6
SUT VIC AB 1 CTX36XBRD ANBCTR (SUTURE) ×2 IMPLANT
SUT VIC AB 2-0 CT1 27 (SUTURE) ×3
SUT VIC AB 2-0 CT1 TAPERPNT 27 (SUTURE) ×1 IMPLANT
SUT VIC AB 3-0 X1 27 (SUTURE) ×3 IMPLANT
SUT VICRYL 0 CT 1 36IN (SUTURE) ×6 IMPLANT
SYR 20CC LL (SYRINGE) ×3 IMPLANT
SYR BULB IRRIGATION 50ML (SYRINGE) ×2 IMPLANT
SYR CONTROL 10ML LL (SYRINGE) ×6 IMPLANT
TOWEL OR 17X24 6PK STRL BLUE (TOWEL DISPOSABLE) ×3 IMPLANT
TOWEL OR 17X26 10 PK STRL BLUE (TOWEL DISPOSABLE) ×3 IMPLANT
TRAY FOLEY CATH 16FRSI W/METER (SET/KITS/TRAYS/PACK) ×3 IMPLANT
WATER STERILE IRR 1000ML POUR (IV SOLUTION) ×3 IMPLANT
YANKAUER SUCT BULB TIP NO VENT (SUCTIONS) ×3 IMPLANT

## 2016-08-06 NOTE — H&P (Signed)
Dante GangCharles E Blake is an 40 y.o. male.    Mr. Claiborne BillingsCallahan returns today, he is complaining of bilateral leg discomfort, right side greater than  left.  He is having numbness and paresthesias that are intermittent into the left anterior thigh.  These seem to come and go, do not really follow a pattern.  He is experiencing pain on the right side that is primarily along the right side anterolateral thigh extending down below the knee on the right side.  The pain on the right side is worsened with standing and ambulation.  He has had a history of previous disk herniation surgery with resection of the disks at the L4-5 level bilaterally for a large central disk  protrusion.  That surgery was successful, but his pain is worsening to where he is having trouble with standing and ambulation and experiencing spasm into his legs at nighttime.  He notes that he has to sleep on his abdomen in order to decrease his pain.  He has to lay on his abdomen and he does tend to flex his leg upwards his wife notices.  No bowel or bladder symptoms.  The numbness he has in the left side is in the left anterior thigh along the area of the anterior aspect of this quad.  He notices difficulty with prolonged standing and ambulation, notes that he has fallen at least twice getting up and out of bed, feels a pulling like sensation into his legs as he tries to stand and ambulate.  Pain with repetitive bending and stooping, with sitting for prolonged periods of time, but worsens with standing and walking.  He underwent recent MRI scan as his EMGs and nerve conduction studies have returned with a possible poly lumbar radiculopathy according to notes from Dr. Marjory LiesPenumalli.     Past Medical History:  Diagnosis Date  . ADHD (attention deficit hyperactivity disorder)   . Anxiety   . Arthritis    knee and back  . Chronic back pain   . Depression   . GERD (gastroesophageal reflux disease)   . Hypertension   . Neuropathy (HCC)   . PONV  (postoperative nausea and vomiting)     Past Surgical History:  Procedure Laterality Date  . BACK SURGERY    . CARPAL TUNNEL RELEASE Left   . HAND SURGERY    . KNEE SURGERY Right   . TONSILLECTOMY      Family History  Problem Relation Age of Onset  . Heart failure Mother   . Heart disease Mother   . Diabetes Father   . Stroke Brother    Social History:  reports that he has been smoking Cigarettes.  He has a 18.00 pack-year smoking history. He has never used smokeless tobacco. He reports that he does not drink alcohol or use drugs.  Allergies:  Allergies  Allergen Reactions  . Tramadol Nausea And Vomiting  . Vicodin [Hydrocodone-Acetaminophen] Nausea And Vomiting  . Zofran [Ondansetron] Nausea And Vomiting     ? ?    Medications Prior to Admission  Medication Sig Dispense Refill  . amphetamine-dextroamphetamine (ADDERALL XR) 30 MG 24 hr capsule Take 30 mg by mouth daily.  0  . aspirin EC 81 MG tablet Take 81 mg by mouth daily.    . diazepam (VALIUM) 10 MG tablet Take 10 mg by mouth 2 (two) times daily.  0  . dimenhyDRINATE (DRAMAMINE) 50 MG tablet Take 50 mg by mouth every 8 (eight) hours as needed for nausea.    .Marland Kitchen  DULoxetine (CYMBALTA) 60 MG capsule Take 60 mg by mouth daily.  1  . gabapentin (NEURONTIN) 800 MG tablet Take 800 mg by mouth 2 (two) times daily.     Marland Kitchen gemfibrozil (LOPID) 600 MG tablet Take 600 mg by mouth 2 (two) times daily.  5  . HYDROcodone-acetaminophen (NORCO) 10-325 MG tablet Take 1 tablet by mouth every 6 (six) hours as needed.  0  . meloxicam (MOBIC) 15 MG tablet Take 15 mg by mouth daily.  0  . metoprolol tartrate (LOPRESSOR) 25 MG tablet Take 25 mg by mouth 2 (two) times daily.    Marland Kitchen morphine (MSIR) 15 MG tablet Take 1 tablet (15 mg total) by mouth every 4 (four) hours as needed for moderate pain or severe pain. (Patient not taking: Reported on 07/30/2016) 10 tablet 0  . oxyCODONE-acetaminophen (PERCOCET/ROXICET) 5-325 MG tablet Take 2 tablets by mouth  every 4 (four) hours as needed for severe pain. (Patient not taking: Reported on 07/30/2016) 15 tablet 0    No results found for this or any previous visit (from the past 48 hour(s)). No results found.  Review of Systems  Constitutional: Negative.   HENT: Negative.   Eyes: Negative.   Respiratory: Negative.   Cardiovascular: Negative.   Gastrointestinal: Negative.   Genitourinary: Negative.   Musculoskeletal: Positive for back pain.  Skin: Negative.   Psychiatric/Behavioral: Negative.     Blood pressure 121/81, pulse 65, temperature 98 F (36.7 C), temperature source Oral, resp. rate 20, height 5\' 11"  (1.803 m), SpO2 99 %. Physical Exam  Constitutional: He is oriented to person, place, and time. No distress.  HENT:  Head: Normocephalic and atraumatic.  Eyes: EOM are normal. Pupils are equal, round, and reactive to light.  Neck: Normal range of motion.  Cardiovascular: Normal rate.   Respiratory: Effort normal. No respiratory distress.  GI: He exhibits no distension.  Neurological: He is alert and oriented to person, place, and time.  Skin: Skin is warm and dry.  Psychiatric: He has a normal mood and affect.     PHYSICAL EXAMINATION:  Ezekiel' exam shows that his sciatic tension tests are negative. Popliteal compression sign is negative.  He tends to give way testing most major motor groups in the right and left leg.  Reflexes are decreased at the knee and ankle.  No clonus, no spasticity noted.  No allergies.    RADIOGRAPHS:   The results of his MRI scan are reviewed and they support that he has a lateral disk protrusion on the left side at the L3-4 level potentially affecting the left L3 nerve root in the extraforaminal area and within the lateral aspect of the foramen.  The central portions of the canal are relatively well maintained at this level.  He does have some mild degenerative changes of the facets.  At the L4-5 level the disk space height is narrowed considerably, there  is no anterolisthesis.  There is some very mild disk bulge at this level.  The neuroforamen on the right side is showing severe stenosis findings associated with both disk bulge and narrowing of the disk space height and narrowing of the intrapedicular distance.  The left side is open and the central portions of the canal appear to be well open.  The subarticular aspect of the patient's spinal canal is narrowed on the right side.  The disk at the L5-S1 level shows some very  mild desiccation and there is no sign of nerve compression at this level.  The  disks at L1-2 and L2-3 appear to be in good condition.   ASSESSMENT:  This patient is experiencing a persistent severe back pain with radiation into his lower extremities.  He has had EMGs and nerve conduction studies that suggest a poly lumbar radiculopathy.  It is apparent that he has extraforaminal nerve compression on the left side at L3 associated with lateral protrusion.  Plain radiographs suggest that this also may be a spur along the lateral left L3-4 disk space.  On the right side he has a spur at the L4-5 level.  He has disk height narrowing associated with previous resection of his degenerative disk disease here.  He has severe foraminal stenosis affecting his right L4 nerve root.   PLAN:  This patient weighs in excess of 350 pounds, nearly 380 pounds by his own calculation.  He notes that a brother of his weighed well over 600 pounds and went through a bariatric surgery to try and lose weight.  He has been seen by Dr. Mila Palmer and his wife notes that he does have a significant history of snoring and may have sleep apnea.  Sleep studies were ordered, but apparently were never done.  He relates that he has been studied for diabetes and this has returned negative.  So that overall his risks need to be assessed before consideration of intervention.  The foraminal narrowing that is related to disk height loss on the right side and interforaminal narrowing  with interpedicular distance narrowing likely will require a fusion in order to decompress and prevent a recurrence of stenosis with further collapse at this level.  The lateral disk protrusion on the left side at L3-4 if treated with discectomy at the same time a fusion is done at L4-5 is not likely to result in an improvement in his overall condition and I suspect that he would be back for further fusion surgery at the 3-4 level due to the fact that he has disk degeneration and protrusion at this segment immediately adjacent to his previous degenerated disk at L4-5 with foraminal entrapment and if we were to treat this with fusion at L4-5 I think we should probably include fusing the L3-4 level.  This would be done with a left-sided facetectomy in order to decompress the left L3 nerve root and TLIF on the left side at L3-4 and then on the right side a facetectomy at L4-5 with TLIF on the right side at L4-5 and instrumentation from L3-L5.  The patient is quite large, his BMI certainly is up there, however, he is quite tall also.  I think that it would be reasonable to ask his primary care physician to look at his situation and help [place] discern whether or not we have assessed medical risk adequately, as surgical intervention in a person of his size is likely to take as long as 6 or 7 hours to fuse 2 levels.  I discussed the risks of surgery with Mavryk that include risks of infection, risk of bleeding, risk to the nerves themselves and he is interested in pursuing surgical intervention.  I have explained to Leonette Most that I do not want to give him medication stronger than hydrocodone and I will not prescribe medication stronger than hydrocodone and he understands.  I think that it is important that we try and decrease his medication uses, especially in light of the fact that if he has surgery he would be extremely resistant to narcotics if we continue to use strong narcotics.  We  will request Dr. Mila Palmer to assess his  situation and help [place] determine whether or not we need to be looking at treatment of sleep apnea or any other medical concerns before scheduling his intervention.  I will continue him with Norco 10/325 mg #90, 1 or 2 every 4-6 hours p.r.n. pain and we will go ahead and try and obtain permission to perform this surgery.  He would require the use of a brace for a period of 3 months postoperatively and then wean from his brace in the fourth month post-op.  I would expect that with the treatment of this back condition that we should be able to forgo the use of narcotic medications after a period of 6 or 8 weeks.    Naida Sleight, PA-C 08/06/2016, 7:16 AM  Patient examined and lab reviewed with Barry Dienes, PA-C.

## 2016-08-06 NOTE — Transfer of Care (Signed)
Immediate Anesthesia Transfer of Care Note  Patient: [name]  Procedure(s) Performed: Procedure(s): Left L3-4 and Right L4-5 transforaminal lumbar interbody fusion with pedicle screws and rods, interbody cages, local bone graft, allograft and Vivigen (N/A)  Patient Location: PACU  Anesthesia Type:General  Level of Consciousness: awake, alert  and oriented  Airway & Oxygen Therapy: Patient Spontanous Breathing and Patient connected to nasal cannula oxygen  Post-op Assessment: Report given to RN, Post -op Vital signs reviewed and stable and Patient moving all extremities X 4  Post vital signs: Reviewed and stable  Last Vitals:  Vitals:   08/06/16 0651 08/06/16 1602  BP: 121/81   Pulse: 65   Resp: 20   Temp: 36.7 C 36.6 C    Last Pain:  Vitals:   08/06/16 0651  TempSrc: Oral         Complications: No apparent anesthesia complications

## 2016-08-06 NOTE — Brief Op Note (Signed)
PATIENT ID:      Bobby GangCharles E Ramirez  MRN:     [id number] DOB/AGE:    1976-08-28 / 40 y.o.       OPERATIVE REPORT   DATE OF PROCEDURE:  08/06/2016      PREOPERATIVE DIAGNOSIS:   Left L3-4 lateral herniated nucleus pulposus, L4-5 spinal stenosis with degenerative disc disease, foraminal stenosis                                                       There is no height or weight on file to calculate BMI.    POSTOPERATIVE DIAGNOSIS:   Left L3-4 lateral herniated nucleus pulposus, L4-5 spinal stenosis with degenerative disc disease, foraminal stenosis                                                                     There is no height or weight on file to calculate BMI.    PROCEDURE:  Procedure(s): Left L3-4 and Right L4-5 transforaminal lumbar interbody fusion with pedicle screws and rods, interbody cages, local bone graft, allograft and Vivigen    SURGEON: Makhi Muzquiz E   ASSISTANT: Andee LinemanJames Owens,PA-C          ANESTHESIA:  General supplemented with local anesthetic marcaine0.5% 1:1 exparel 1.3% total 30cc. Dr.FLowers.  EBL:2000cc  DRAINS:Foley to SD  CELL SAVER RETURNED: 800cc  COMPONENTS:   Implant Name Type Inv. Item Serial No. Manufacturer Lot No. LRB No. Used  BONE CANC CHIPS 20CC - [id number] Bone Implant [facility]ONE [facility] [id number] [employer] [place] [facility]  N/A 1  BONE MATRIX VIVIGEN 5CC - [id number] Bone Implant BONE [employer] VIVIGEN 5CC [id number] [employer] [place] [facility]  N/A 1  SPACER 10X28 TPAL - [id number] Spacer SPACER 10X28 TPAL  SYNTHES TRAUMA  N/A 1  SCREW CORTICAL VIPER 7X40MM - [id number] Screw SCREW CORTICAL VIPER 7X40MM  DEPUY SPINE  N/A 5  SCREW SET SINGLE INNER - [id number] Screw SCREW SET SINGLE INNER  DEPUY SPINE  N/A 6  ROD EXPEDIUM PRE BENT 5.5X75 - [id number] Rod ROD EXPEDIUM PRE BENT 5.5X75  DEPUY SPINE  N/A 1  SCREW CORTICAL VIPER 7X45MM - [id number] Screw SCREW CORTICAL VIPER 7X45MM  DEPUY SPINE  N/A 1  CAGE CONCORDE BULLET [id number] -  [id number] Cage CAGE CONCORDE BULLET [id number]  DEPUY SPINE  N/A 1  ROD EXPEDIUM PRE BENT 5.5X75 - [id number] Rod ROD EXPEDIUM PRE BENT 5.5X75  DEPUY SPINE  N/A 1  ROD EXPEDIUM PREBENT 85MM - [id number] Rod ROD EXPEDIUM PREBENT 85MM  DEPUY SPINE  N/A 1  CONNECTOR CROSS SFX A2 - [id number] Connector CONNECTOR CROSS SFX A2   DEPUY SPINE   N/A 1    COMPLICATIONS:  None   CONDITION:  stable    [name] 08/06/2016, 3:42 PM

## 2016-08-06 NOTE — Brief Op Note (Signed)
08/06/2016  3:19 PM  PATIENT:  [name]  40 y.o. male  MRN: [id number]  OPERATIVE REPORT  PRE-OPERATIVE DIAGNOSIS:  Left L3-4 lateral herniated nucleus pulposus, L4-5 spinal stenosis with degenerative disc disease, foraminal stenosis  POST-OPERATIVE DIAGNOSIS:  Left L3-4 lateral herniated nucleus pulposus, L4-5 spinal stenosis with degenerative disc disease, foraminal stenosis  PROCEDURE:  Procedure(s): Left L3-4 and Right L4-5 transforaminal lumbar interbody fusion with pedicle screws and rods, interbody cages, local bone graft, allograft and Vivigen    SURGEON:  Jessy Oto, MD     ASSISTANT:  Benjiman Core, PA-C  (Present throughout the entire procedure and necessary for complJames Owensetion of procedure in a timely manner)     ANESTHESIA:  General,    COMPLICATIONS:  None.     COMPONENTS:  Implant Name Type Inv. Item Serial No. Manufacturer Lot No. LRB No. Used  BONE CANC CHIPS 20CC - [id number] Bone Implant BONE CANC CHIPS 20CC [id number] [facility] [facility]  N/A 1  BONE MATRIX VIVIGEN 5CC - [id number] Bone Implant BONE MATRIX VIVIGEN 5CC [id number] [facility] [place] [facility]  N/A 1  SPACER 10X28 TPAL - [id number] Spacer SPACER 10X28 TPAL  [employer]ES [employer]UMA  N/A 1  SCREW CORTICAL VIPER 7X40MM - [id number] Screw SCREW CORTICAL VIPER 7X40MM  DEPUY SPINE  N/A 5  SCREW SET SINGLE INNER - [id number] Screw SCREW SET SINGLE INNER  DEPUY SPINE  N/A 6  ROD EXPEDIUM PRE BENT 5.5X75 - [id number] Rod ROD EXPEDIUM PRE BENT 5.5X75  DEPUY SPINE  N/A 1  SCREW CORTICAL VIPER 7X45MM - [id number] Screw SCREW CORTICAL VIPER 7X45MM  DEPUY SPINE  N/A 1  CAGE CONCORDE BULLET [id number] - [id number] Cage CAGE CONCORDE BULLET [id number]  DEPUY SPINE  N/A 1  ROD EXPEDIUM PRE BENT 5.5X75 - [id number] Rod ROD EXPEDIUM PRE BENT 5.5X75  DEPUY SPINE  N/A 1  ROD EXPEDIUM PREBENT 85MM - [id number] Rod ROD EXPEDIUM PREBENT 85MM  DEPUY SPINE  N/A 1  CONNECTOR CROSS SFX A2 - [id number] Connector  CONNECTOR CROSS SFX A2   DEPUY SPINE   N/A 1     PROCEDURE:    The patient was met in the holding area, and the appropriate lumbar levels left L4-5 and L3-4 identified and marked with an "X" and my initials. I had discussion with the patient in the preop holding area regarding consent for surgery and risks and benifits.The fusion levels are reidentified as left L4-5 and L3-4. Patient understands the rationale to perform TLIFs at two levels to decompress the bilateral L3-4 and L4-5 lateral recess and foramenal stenosis and to allow for some presevation of lordosis. The patient was then transported to OR and was placed under general anestheticwithout difficulty. The patient received appropriate preoperative antibiotic prophylaxis 3 gm Ancef.  Nursing staff inserted a Foley catheter under sterile conditions. The patient was then turned to a prone position using the Gillespie spine frame. PAS. all pressure points well padded the arms at the side to 90 90. Standard prep with DuraPrep solution draped in the usual manner from the lower dorsal spine the mid sacral segment. Iodine Vi-Drape was used and the old incision scar was marked. Time-out procedure was called and correct. Skin in the midline between L2 and S1 was then infiltrated with local anesthesia, marcaine 1/2% 1:1 exparel 1.3% total 30 cc used. Incision was then made  extending from L3-S1  through the skin and subcutaneous layers down to the patient's lumbodorsal fascia and spinous processes. The incision then  carried sharply excising the supraspinous ligament and then continuing the lateral aspect of the spinous processes of L2, L3, L4 and L5. Cobb elevator used to carefully elevate the paralumbar muscles off of the posterior elements using electrocautery carefully drilled bleeding and perform dissection of the muscle tissues of the preserving the facet capsule at the L2-3. Continuing the exposure out laterally to expose the lateral margin of the facet  joint line at L2-3, L3-4 and L4-5 . Incision was carried in the midline down to the S1 level area bleeders controlled using electrocautery monopolar electrocautery.  Viper self retaining retractors were used for the incision. C-arm fluoroscopy was then brought into the field and using C-arm fluoroscopy then a hole made into the medial aspect of the pedicle of right L3 observed in the pedicle using C arm at the 5 oclock position on the right L3 pedicle nerve probe initial entry was determined on fluoroscopy to be good position alignment so that a 4.66m tap was passed to 40 mm within the right L3 pedicle to a depth of nearly 40 mm observed on C-arm fluoroscopy to be beyond the midpoint of the lumbar vertebra and then position alignment within the right L3 pedicle this was then removed and the pedicle channel probed demonstrating patency no sign of rupture the cortex of the pedicle. Tapping with a 4 mm screw tap then 5 mm tap then 6.0 mm tap and then a 7.0 mm  Tap a 7.0 mm x 40 mm screw was placed on the right side at the L3 level. C-arm fluoroscopy was then brought into the field and using C-arm fluoroscopy then a hole made into the posterior medial aspect of the pedicle of right L4 observed in the pedicle using ball tipped nerve hook and hockey stick nerve probe initial entry was determined on fluoroscopy to be good position alignment so that 4.043mtap was then used to tap the right L4 pedicle to a depth of nearly 40 mm observed on C-arm fluoroscopy to be beyond the midpoint of the lumbar vertebra and then position alignment within the right L4 pedicle this was then removed and the pedicle channel probed demonstrating patency no sign of rupture the cortex of the pedicle. Tapping with a 5 mm screw tap then a 6.0 mm tap and then a 7.0 mm tap, a  7.0 mm x 40 mm screw was placed on right L4 side. C-arm fluoroscopy was then brought into the field and using C-arm fluoroscopy then a hole made into the posterior and medial  aspect of the right pedicle of L4 observed in the pedicle using ball tipped nerve hook and hockey stick nerve probe initial entry was determined on fluoroscopy to be good position alignment so that a 4.57m64map was then used to tap the right L4 pedicle to a depth of nearly 40 mm observed on C-arm fluoroscopy to be beyond the posterior one third of the lumbar vertebra and good position alignment within the right L4 pedicle this was then removed and the pedicle channel probed demonstrating patency no sign of rupture the cortex of the pedicle. Tapping with a 5 mm screw tap then a 6.0 mm tap and then a 7.0 mm tap,  then 7.57mm73m40 mm screw was placed on the right side at the L4 level. The pedicle channel of L4 on the right probed demonstrating patency no sign of rupture the cortex of the pedicle. Viper screw for fixation of this level was measured as 7.0 mm x 40  mm screw so  was placed on the right side at the L4 level. C-arm fluoroscopy was then brought into the field and using C-arm fluoroscopy then a hole made into the posterior and medial aspect of the right pedicle of L5 observed in the pedicle using ball tipped nerve hook and hockey stick nerve probe initial entry was determined on fluoroscopy to be good position alignment so that a 4.76m tap was then used to tap the right L5 pedicle to a depth of nearly 40 mm observed on C-arm fluoroscopy to be beyond the posterior one third of the lumbar vertebra and good position alignment within the right L5 pedicle this was then removed and the pedicle channel probed demonstrating patency no sign of rupture the cortex of the pedicle. Tapping with a 5 mm screw tap and then a 6.0 mm tap and then a 7.0 mm tap,  Then a 7.048mx 40 mm screw was placed on the right side at the L5  level. The pedicle channel of L4 on the right probed demonstrating patency no sign of rupture the cortex of the pedicle. Viper screw for fixation of this level was measured as 7.0 mm x 40 mm screw placed on  the the right side at the L5 level. C-arm fluoroscopy was then brought into the field and using C-arm fluoroscopy then a hole made into the posterior and medial aspect of the left pedicle of L5 observed in the pedicle using ball tipped nerve hook and hockey stick nerve probe initial entry was determined on fluoroscopy to be good position alignment so that a 4.19m719map was then used to tap the left L5 pedicle to a depth of nearly 40 mm observed on C-arm fluoroscopy to be beyond the posterior one third of the lumbar vertebra and good position alignment within the left L5 pedicle this was then removed and the pedicle channel probed demonstrating patency no sign of rupture the cortex of the pedicle. Tapping with a 5 mm screw tap then a 6.0 mm tap,  then 7.19mm64mp then a 7.19mm 3m0 mm screw was placed on the left side at the L5 level. The pedicle channel of L5 on the left probed demonstrating patency no sign of rupture the cortex of the pedicle. Viper screw for fixation of this level was measured as 7.0 mm x 40 mm screw so  was placed on the left side at the L5 level. C-arm fluoroscopy was then brought into the field and using C-arm fluoroscopy then a hole made into the posterior and medial aspect of the left pedicle of L4 observed in the pedicle using ball tipped nerve hook and hockey stick nerve probe initial entry was determined on fluoroscopy to be good position alignment so that a 4.19mm t37mwas then used to tap the left L4 pedicle to a depth of nearly 40 mm observed on C-arm fluoroscopy to be beyond the posterior one third of the lumbar vertebra and good position alignment within the left L4 pedicle this was then removed and the pedicle channel probed demonstrating patency no sign of rupture the cortex of the pedicle. Tapping with a 5 mm screw tap and then a 6.0 mm tap and 7.0 mm tap, The pedicle channel of L4 on the left probed demonstrating patency no sign of rupture the cortex of the pedicle. Viper screw for  fixation of this level was measured as 7.0 mm x 40 mm screw left on the field for insertion after L4-5 decompression and TLIF. Following  tapping with a 4 mm, 5mm and 6 mm taps a 7.0 x 40 mm screw was placed on the table to be inserted following decompression and TLIF. C-arm fluoroscopy was then brought into the field and using C-arm fluoroscopy then a hole made into the posterior and medial aspect of the left pedicle of L3 observed in the pedicle using ball tipped nerve hook and hockey stick nerve probe initial entry was determined on fluoroscopy to be good position alignment so that a 4.0mm tap was then used to tap the left L3 pedicle to a depth of nearly 45 mm observed on C-arm fluoroscopy to be beyond the posterior one third of the lumbar vertebra and good position alignment within the left L3 pedicle this was then removed and the pedicle channel probed demonstrating patency no sign of rupture the cortex of the pedicle. Tapping with a 5 mm screw tap and then a 6.0 mm tap and 7.0 mm tap, The pedicle channel of L3 on the left probed demonstrating patency no sign of rupture the cortex of the pedicle. Viper screw for fixation of this level was measured as 7.0 mm x 45 mm screw left on the field for insertion after L4-5 decompression and TLIF. Following tapping with a 4 mm, 5mm and 6 mm taps a 7.0 x 45mm screw was placed on the table to be inserted following decompression and TLIF. Flow Seal was used for hemostasis of the left L3 and L4 pedicle screw holes.  The spinous process and central lamina at L4 and  the lower 50% L3 segment was resected and Leksell rongeur used to resect inferior aspect of the lamina on the left side at the L3 and L4 level. The right medial 40% of the facet of L4-5 and L3-4 were resected in order to decompress the right side of the lumbar thecal sac at  L4-5 and L3-4 and decompress the right L3, L4 and L5 neuroforamen. Osteotomes and 2mm and 3mm kerrisons were used for this portion of the  decompression. Nearly complete facetectomies were perform on the left at L4-5 and L3-4 to provide for exposure of the left side L4-5 and L3-4 neuroforamen for ease of placement of TLIFs (transforaminal lumbar interbody fusion) at each level inferior portions of the lamina and pars were also resected first beginning with the Leksell rongeur and osteotomes and then resecting using 2 and 3 mm Kerrison. Sterilely draped OR Microscope was used for this portion of the procedure. Continued laminectomy was carried out resecting the central portions of the lamina of L4 and inferior 50%L3 performing foraminotomies on the left and right sides at the L3, L4 and L5 levels. The inferior articular process L4 and L3 were resected on the left side. The left L5 nerve root identified and the medial aspect of the L5 pedicle. Superior articular process of L5 was then resected from the left side further decompressing the left L5 nerve and providing for exposure of the area just superior to the L5 pedicle for a placement of L4-5 cage. A large amount of hypertrophic ligmentum flavum was found impressing on the left lateral recesses at L4-5 and L3-4 and narrowing the respective L4 and L5 neuroforamen.Medial superior articular process of L4 was removed as a unit. Attention then turned to placement of the transforaminal lumbar interbody fusion cages. Using a Penfield 4 the left lateral aspect of the thecal sac at the L4-5 disc space was carefully freed up The thecal sac could then easily be retracted in the posterior lateral   aspect of the L4-5 disc was exposed 15 blade scalpel used to incise the posterolateral disc and an osteotome used to resect a small portion of bone off the superior aspect of the posterior superior vertebral body of L5 in order to ease the entry into the L4-5 disc space. A  2mm kerrison rongeur was then able to be introduced in the disc space debrided it was quite narrow. 7 mm dilator was used to dialate the L4-5 disc  space on the left side attempts were made to dilate further the 8 mm, 9mm and then 10mm were successful and using small curettes and the disc space was debrided a minimal degenerative disc present in the endplates debrided to bleeding endplate bone. Shavers were inserted to trial the intervertebral disc space.Trial cage spacer then placed and a 10mm width spacer provided the best fit at the L4-5 level. A 10 mm x 28mm Sythes lordotic TPAL cage was carefully packed with morcellized bone graft and the been harvested from previous laminotomies.The cage was then inserted with the articulating insertion handle.  Additional bone graft was then packed into the intervertebral disc space. Bleeding controlled using bipolar electrocautery thrombin soaked gel cottonoids. Then turned to the left L3-4 level similarly the exposure the posterior lateral aspect this was carried out using a Penfield 4 bipolar electrocautery to control small bleeders present. Derricho retractor used to retract the thecal sac and L3 nerve root a 15 blade scalpel was used to incise posterior lateral aspect of the left L3-4 disc the disc space at this level showed a rather large space posteriorly was more open anteriorly.The space was debrided of degenerative disc material using pituitary along root the entire disc space was then debrided of degenerative disc material using pituitary rongeurs curettage down to bleeding bone endplates. 8 mm and 9 mm shavers were used to debride the disc space and pituitary ronguers used to remove the loosened debris. This space was then carefully assess using spacers  a 8.0mm, 9 mm, 10 mm,11 mm and 12 mm trial cage provided the best fit, the Depuy concorde bullet lordotic cage 12mm x 28mm was chosen so that the permanent 12.0 mm cage by 28 mm Lordotic concorde cage packed with local bone graft and vivigen was placed into the intervertebral disc space. The posterior intervertebral disc space was then packed with autogenous  local bone graft that been harvested from the central laminectomy. Bleeding controlled using bipolar electrocautery.  Observed on C-arm fluoroscopy to be in good position alignment. The cages at L4-5 and L3-4 were placed anteriorly as best as possible to maintain lumbar lordosis that was present. With this then the transforaminal lumbar interbody fusion portion of the case was completed bleeders were controlled using bipolar electrocautery thrombin-soaked Gelfoam were appropriate.Decortication of the facet joints carried out bilateral L4-5 and L3-4. These were packed with cancellous local bone graft. 3 pedicle screws on the right and the left L5 pedicle screw already in place, the 2 viper corticofixation screws on the left at L3 and L4 were each placed and then each fastener carefully aligned  to allow for placement of rods. The right side first quarter inch titanium rod was then carefully contoured using the french benders and a precontoured 85mm rod. This was then placed into the pedicle screws on the right extending from L3-L5 each of the caps were carefully placed loosely tightened. Attention turned to the left side were similarly and then screws were carefully adjusted to allow for a better pattern screws to   allow for placement of fixation of the rod a quarter inch 75 mm precontoured titanium rod was then carefully contoured. This was able to be inserted into the left pedicle screw fasteners, Caps onto the L3 fasteners were tightened to 80 foot lbs. Across the left side  L3-4 and L4-5 screw fasteners compression was obtained on the left side between L3 and L4, then L4 and L5 by compressing between the fasteners and tightening the screw caps 85 pounds. Similarly this was done on the right side at L3-4 and L4-5 obtaining compression and tightened 85 pounds. Irrigation was carried out with copious amounts of saline solution this was done throughout the case. Cell Saver was used during the case. A total of 800 cc  of cell saver blood was returned to the patient. A cross link was measured to be an A-2 crosslink size at the L3-4 interpedicular screw level. The crosslink then placed and the locking mechanism for the connectors to the rods engaged and tightened to 85 foot lbs. The central connector for the cross link then tightened to 85 foot-lbs.Hockey stick neuroprobe was used to probe the neuroforamen bilateral L3, L4 and L5, these were determined to be well decompressed. Permanent C-arm images were obtained in AP and lateral plane and oblique planes. Remaining local bone graft was then applied along both lateral posterior lateral region extending from L3 to L5 facet beds.Gelfoam was then removed spinal canal. The lumbodorsal musculature carefully exam debrided of any devitalized tissue following removal of cerebellar retractors were the bleeders were controlled using electrocautery and the area dorsal lumbar muscle were then approximated in the midline with interrupted #1 Vicryl sutures loose the dorsal fascia was reattached to the spinous process of L2 to superiorly and L5  inferiorly this was done with #1 Vicryl sutures. Subcutaneous layers then approximated using interrupted 0 Vicryl sutures and 2-0 Vicryl sutures. Skin was closed with a running subcutaneous stitch of 4-0 Vicryl Dermabond was applied then MedPlex bandage. All instrument and sponge counts were correct. The patient was then returned to a supine position on her bed reactivated extubated and returned to the recovery room in satisfactory condition.     Eulalie Speights Owens, PA-C perform the duties of assistant surgeon during this case. He was present from the beginning of the case to the end of the case assisting in transfer the patient from her stretcher to the OR table and back to the stretcher at the end of the case. Assisted in careful retraction and suction of the laminectomy site delicate neural structures operating under the operating room microscope. He  performed closure of the incision from the fascia to the skin applying the dressing.     Tyeisha Dinan E  08/06/2016, 3:19 PM    

## 2016-08-06 NOTE — Discharge Instructions (Signed)
° ° °  Call if there is increasing drainage, fever greater than 101.5, severe head aches, and °worsening nausea or light sensitivity. °If shortness of breath, bloody cough or chest tightness or pain go to an emergency room. °No lifting greater than 10 lbs. °Avoid bending, stooping and twisting. °Use brace when sitting and out of bed even to go to bathroom. °Walk in house for first 2 weeks then may start to get out slowly increasing distances up to one half mile by 4-6 weeks post op. °After 5 days may shower and change dressing following bathing with shower.When °bathing remove the brace shower and replace brace before getting out of the shower. °If drainage, keep dry dressing and do not bathe the incision, use an moisture impervious dressing. °Please call and return for scheduled follow up appointment 2 weeks from the time of surgery. ° ° °

## 2016-08-06 NOTE — Interval H&P Note (Signed)
Patient was seen and examined in the preop holding area. There has been no interval  Change in this patient's exam preop  history and physical exam  Lab tests and images have been examined and reviewed.  The Risks benefits and alternative treatments have been discussed  extensively,questions answered.  The patient has elected to undergo the discussed surgical treatment. 

## 2016-08-06 NOTE — Anesthesia Postprocedure Evaluation (Signed)
Anesthesia Post Note  Patient: [name]  Procedure(s) Performed: Procedure(s) (LRB): Left L3-4 and Right L4-5 transforaminal lumbar interbody fusion with pedicle screws and rods, interbody cages, local bone graft, allograft and Vivigen (N/A)  Patient location during evaluation: PACU Anesthesia Type: General Level of consciousness: awake and alert and oriented Pain management: pain level controlled Vital Signs Assessment: post-procedure vital signs reviewed and stable Respiratory status: spontaneous breathing, nonlabored ventilation, respiratory function stable and patient connected to nasal cannula oxygen Cardiovascular status: blood pressure returned to baseline and stable Postop Assessment: no signs of nausea or vomiting Anesthetic complications: no    Last Vitals:  Vitals:   08/06/16 0651 08/06/16 1602  BP: 121/81   Pulse: 65   Resp: 20   Temp: 36.7 C 36.6 C    Last Pain:  Vitals:   08/06/16 1630  TempSrc:   PainSc: 10-Worst pain ever                 Genoa Freyre A.

## 2016-08-06 NOTE — Progress Notes (Signed)
Pt admitted from PACU post op, alert and oriented with family at bedside, pt settled in bed, call light within pt's reach, v/s stable, will however continue to monitor. Obasogie-Asidi, Eitan Doubleday Efe

## 2016-08-06 NOTE — Anesthesia Procedure Notes (Signed)
Procedure Name: Intubation Date/Time: 08/06/2016 7:42 AM Performed by: Reine JustFLOWERS, Dashay Giesler T Pre-anesthesia Checklist: Patient identified, Emergency Drugs available, Suction available, Patient being monitored and Timeout performed Patient Re-evaluated:Patient Re-evaluated prior to inductionOxygen Delivery Method: Circle system utilized and Simple face mask Preoxygenation: Pre-oxygenation with 100% oxygen Intubation Type: IV induction Ventilation: Mask ventilation with difficulty and Oral airway inserted - appropriate to patient size Laryngoscope Size: Hyacinth MeekerMiller and 3 Grade View: Grade I Tube type: Oral Tube size: 7.5 mm Number of attempts: 1 Airway Equipment and Method: Patient positioned with wedge pillow and Stylet Placement Confirmation: ETT inserted through vocal cords under direct vision,  positive ETCO2 and breath sounds checked- equal and bilateral Secured at: 23 cm Tube secured with: Tape Dental Injury: Teeth and Oropharynx as per pre-operative assessment

## 2016-08-07 LAB — CBC
HEMATOCRIT: 33.5 % — AB (ref 39.0–52.0)
Hemoglobin: 11.5 g/dL — ABNORMAL LOW (ref 13.0–17.0)
MCH: 31.2 pg (ref 26.0–34.0)
MCHC: 34.3 g/dL (ref 30.0–36.0)
MCV: 90.8 fL (ref 78.0–100.0)
Platelets: 137 10*3/uL — ABNORMAL LOW (ref 150–400)
RBC: 3.69 MIL/uL — ABNORMAL LOW (ref 4.22–5.81)
RDW: 12.7 % (ref 11.5–15.5)
WBC: 9.1 10*3/uL (ref 4.0–10.5)

## 2016-08-07 LAB — BASIC METABOLIC PANEL
Anion gap: 5 (ref 5–15)
BUN: 15 mg/dL (ref 6–20)
CALCIUM: 8.4 mg/dL — AB (ref 8.9–10.3)
CHLORIDE: 106 mmol/L (ref 101–111)
CO2: 26 mmol/L (ref 22–32)
CREATININE: 1.02 mg/dL (ref 0.61–1.24)
GFR calc Af Amer: >60 mL/min (ref >60)
GFR calc non Af Amer: >60 mL/min (ref >60)
GLUCOSE: 143 mg/dL — AB (ref 65–99)
Potassium: 4 mmol/L (ref 3.5–5.1)
Sodium: 137 mmol/L (ref 135–145)

## 2016-08-07 MED ORDER — DIAZEPAM 5 MG PO TABS
5.0000 mg | ORAL_TABLET | Freq: Four times a day (QID) | ORAL | Status: DC | PRN
  Administered 2016-08-08 – 2016-08-09 (×4): 5 mg via ORAL
  Filled 2016-08-07 (×4): qty 1

## 2016-08-07 MED ORDER — OXYCODONE HCL ER 15 MG PO T12A
30.0000 mg | EXTENDED_RELEASE_TABLET | Freq: Two times a day (BID) | ORAL | Status: DC
  Administered 2016-08-07 – 2016-08-09 (×4): 30 mg via ORAL
  Filled 2016-08-07 (×4): qty 2

## 2016-08-07 NOTE — Progress Notes (Signed)
     Subjective: 1 Day Post-Op Procedure(s) (LRB): Left L3-4 and Right L4-5 transforaminal lumbar interbody fusion with pedicle screws and rods, interbody cages, local bone graft, allograft and Vivigen (N/A) Awake, alert and oriented x 4. C/O some numbness left lateral thigh c/w irritation from mobilizing nerve roots for TLIFs left side. Voiding okay post d/c foley. C/o persistent severe pain. On MS for chronic pain and preop oxycodone. Currently the IV morphine not as effective. PT/OT today and ambulated in the hallway. PT tomorrow to work on stairs.  Patient reports pain as marked.    Objective:   VITALS:  Temp:  [97 F (36.1 C)-98.5 F (36.9 C)] 98.5 F (36.9 C) (09/13 1432) Pulse Rate:  [60-85] 79 (09/13 1432) Resp:  [11-17] 17 (09/13 1432) BP: (104-146)/(44-90) 119/67 (09/13 1432) SpO2:  [96 %-100 %] 98 % (09/13 1432) Weight:  [380 lb (172.4 kg)] 380 lb (172.4 kg) (09/12 2000)  Neurologically intact ABD soft Neurovascular intact Sensation intact distally Intact pulses distally Dorsiflexion/Plantar flexion intact Incision: scant drainage No cellulitis present   LABS  Recent Labs  08/07/16 1306  HGB 11.5*  WBC 9.1  PLT 137*    Recent Labs  08/07/16 1306  NA 137  K 4.0  CL 106  CO2 26  BUN 15  CREATININE 1.02  GLUCOSE 143*   No results for input(s): LABPT, INR in the last 72 hours.   Assessment/Plan: 1 Day Post-Op Procedure(s) (LRB): Left L3-4 and Right L4-5 transforaminal lumbar interbody fusion with pedicle screws and rods, interbody cages, local bone graft, allograft and Vivigen (N/A)  Advance diet Up with therapy D/C IV fluids  Probable discharge on Friday if he does well with PT Adjust meds start oxycontin 30 mg q 12 hours. Check AM CBC and BMET.  Ryun Velez E 08/07/2016, 6:24 PM

## 2016-08-07 NOTE — Evaluation (Signed)
Physical Therapy Evaluation Patient Details Name: [name] MRN: [id number] DOB: 1976/04/28 Today's Date: 08/07/2016   History of Present Illness  pt is a 40 y/o male with h/o neuropathy, ADHD, HTN, admitted with L L34 HNP and L45 stenosis with DDD, s/p L34 and R 45 TLIF.  Clinical Impression   Pt admitted with/for lumbar fusion surgery.  Pt currently limited functionally due to the problems listed below.  (see problems list.)  Pt will benefit from PT to maximize function and safety to be able to get home safely with available assist of family.  Pt at min to supervision level at this point.  .      Follow Up Recommendations No PT follow up;Supervision for mobility/OOB    Equipment Recommendations  Rolling walker with 5" wheels    Recommendations for Other Services       Precautions / Restrictions Precautions Precautions: Back;Fall Restrictions Weight Bearing Restrictions: No      Mobility  Bed Mobility Overal bed mobility: Needs Assistance Bed Mobility: Rolling;Sidelying to Sit Rolling: Min assist Sidelying to sit: Min assist       General bed mobility comments: demod transitions in/out of bed, log roll.  Transfers Overall transfer level: Needs assistance   Transfers: Sit to/from Stand Sit to Stand: Min guard         General transfer comment: cues for hand placement  Ambulation/Gait Ambulation/Gait assistance: Supervision Ambulation Distance (Feet): 150 Feet Assistive device: Rolling walker (2 wheeled);None Gait Pattern/deviations: Step-through pattern Gait velocity: slower Gait velocity interpretation: Below normal speed for age/gender General Gait Details: generally steady with/without AD, but more painful without RW  Stairs            Wheelchair Mobility    Modified Rankin (Stroke Patients Only)       Balance Overall balance assessment: No apparent balance deficits (not formally assessed)                                            Pertinent Vitals/Pain Pain Assessment: 0-10 Pain Score: 9  Pain Location: back and L leg Pain Descriptors / Indicators: Burning;Grimacing;Sore Pain Intervention(s): Monitored during session;Repositioned;Patient requesting pain meds-RN notified    Home Living Family/patient expects to be discharged to:: Private residence Living Arrangements: Spouse/significant other Available Help at Discharge: Family;Available 24 hours/day Type of Home: House Home Access: Stairs to enter Entrance Stairs-Rails: Doctor, general practice of Steps: several Home Layout: Two level;Bed/bath upstairs        Prior Function Level of Independence: Independent               Hand Dominance        Extremity/Trunk Assessment   Upper Extremity Assessment: Defer to OT evaluation           Lower Extremity Assessment: Overall WFL for tasks assessed (mild weakness, stiffness and parasthesias left LE)         Communication   Communication: No difficulties  Cognition Arousal/Alertness: Awake/alert Behavior During Therapy: WFL for tasks assessed/performed Overall Cognitive Status: Within Functional Limits for tasks assessed                      General Comments General comments (skin integrity, edema, etc.): pt, significant other and Mom instructed in back care/prec, log roll, lifting restrictions, donning brace, progression of activity.    Exercises  Assessment/Plan    PT Assessment Patient needs continued PT services  PT Diagnosis Acute pain   PT Problem List Decreased strength;Decreased activity tolerance;Decreased mobility;Decreased knowledge of use of DME;Decreased knowledge of precautions;Pain  PT Treatment Interventions DME instruction;Gait training;Stair training;Functional mobility training;Therapeutic activities;Patient/family education   PT Goals (Current goals can be found in the Care Plan section) Acute Rehab PT Goals Patient Stated  Goal: home independent PT Goal Formulation: With patient Time For Goal Achievement: 08/14/16 Potential to Achieve Goals: Good    Frequency Min 5X/week   Barriers to discharge        Co-evaluation               End of Session Equipment Utilized During Treatment: Back brace Activity Tolerance: Patient tolerated treatment well Patient left: in chair;with call bell/phone within reach;with family/visitor present Nurse Communication: Mobility status         Time: 9147-82951044-1112 PT Time Calculation (min) (ACUTE ONLY): 28 min   Charges:   PT Evaluation $PT Eval Low Complexity: 1 Procedure PT Treatments $Gait Training: 8-22 mins   PT G Codes:        [name], Bobby GumKenneth Ramirez 08/07/2016, 11:25 AM 08/07/2016  [place] BingKen Rhodes Ramirez, PT [phone]  (pager)

## 2016-08-07 NOTE — Evaluation (Signed)
Occupational Therapy Evaluation Patient Details Name: [name] MRN: [id number] DOB: September 21, 1976 Today's Date: 08/07/2016    History of Present Illness pt is a 40 y/o male with h/o neuropathy, ADHD, HTN, admitted with L L34 HNP and L45 stenosis with DDD, s/p L34 and R 45 TLIF.   Clinical Impression   Pt reports he was independent with ADL PTA. Currently pt overall min guard for functional mobility and ADL with the exception of mod assist for LB ADL and donning brace. Began back, safety, and ADL education with pt. Pt planning to d/c home with 24/7 supervision from family. Pt would benefit from continued skilled OT to address established goals.    Follow Up Recommendations  No OT follow up;Supervision/Assistance - 24 hour (initially)    Equipment Recommendations  Tub/shower seat    Recommendations for Other Services       Precautions / Restrictions Precautions Precautions: Back;Fall Precaution Booklet Issued: Yes (comment) Precaution Comments: Educated pt on back precautions and log roll technique for bed mobility. Required Braces or Orthoses: Spinal Brace Spinal Brace: Lumbar corset;Applied in sitting position Restrictions Weight Bearing Restrictions: No      Mobility Bed Mobility Overal bed mobility: Needs Assistance Bed Mobility: Sidelying to Sit  Sidelying to sit: Min guard       General bed mobility comments: Min guard for safety. HOB flat with use of bed rail. Pt already in sidelying   Transfers Overall transfer level: Needs assistance Equipment used: Rolling walker (2 wheeled) Transfers: Sit to/from Stand Sit to Stand: Min guard         General transfer comment: Cues for hand placement. Increased time required.    Balance Overall balance assessment: Needs assistance Sitting-balance support: Feet supported;No upper extremity supported Sitting balance-Leahy Scale: Good     Standing balance support: No upper extremity supported;During functional  activity Standing balance-Leahy Scale: Good                              ADL Overall ADL's : Needs assistance/impaired Eating/Feeding: Set up;Sitting   Grooming: Min guard;Standing Grooming Details (indicate cue type and reason): Educated pt on use of 2 cups for oral care Upper Body Bathing: Set up;Supervision/ safety;Sitting   Lower Body Bathing: Minimal assistance;Sit to/from stand   Upper Body Dressing : Moderate assistance;Sitting Upper Body Dressing Details (indicate cue type and reason): Educated pt on donning back brace; required mod assist. Lower Body Dressing: Moderate assistance Lower Body Dressing Details (indicate cue type and reason): Pt unable to cross foot over the opposite knee. Reports mother or girlfriend can assist as needed. No need for AE. Toilet Transfer: Min guard;Ambulation;BSC Toilet Transfer Details (indicate cue type and reason): Simulated home environment with use of only R hand rail.       Tub/Shower Transfer Details (indicate cue type and reason): Attempted tub transfer but pt unable to lift foot high enough to clear side of tub. Functional mobility during ADLs: Min guard General ADL Comments: Educated pt on maintaining back precautions during functional activities, log roll technique for bed mobility, brace management and wear schedule.     Vision Vision Assessment?: No apparent visual deficits   Perception     Praxis      Pertinent Vitals/Pain Pain Assessment: Faces Pain Score: 9  Faces Pain Scale: Hurts even more Pain Location: back Pain Descriptors / Indicators: Aching Pain Intervention(s): Monitored during session;Repositioned     Hand Dominance  Extremity/Trunk Assessment Upper Extremity Assessment Upper Extremity Assessment: Overall WFL for tasks assessed   Lower Extremity Assessment Lower Extremity Assessment: Defer to PT evaluation   Cervical / Trunk Assessment Cervical / Trunk Assessment: Other  exceptions Cervical / Trunk Exceptions: s/p spinal sx   Communication Communication Communication: No difficulties   Cognition Arousal/Alertness: Awake/alert Behavior During Therapy: WFL for tasks assessed/performed Overall Cognitive Status: Within Functional Limits for tasks assessed                     General Comments       Exercises       Shoulder Instructions      Home Living Family/patient expects to be discharged to:: Private residence Living Arrangements: Spouse/significant other Available Help at Discharge: Family;Available 24 hours/day Type of Home: House Home Access: Stairs to enter  Home Layout: One level   Bathroom Shower/Tub: Tub/shower unit Shower/tub characteristics: Engineer, building servicesCurtain Bathroom Toilet: Handicapped height     Home Equipment: None   Additional Comments: Planning to stay at mothers house upon d/c; above information reflects mothers house.       Prior Functioning/Environment Level of Independence: Independent             OT Diagnosis: Generalized weakness;Acute pain   OT Problem List: Decreased strength;Decreased activity tolerance;Impaired balance (sitting and/or standing);Decreased knowledge of use of DME or AE;Decreased knowledge of precautions;Obesity;Pain   OT Treatment/Interventions: Self-care/ADL training;Energy conservation;DME and/or AE instruction;Therapeutic activities;Balance training;Patient/family education    OT Goals(Current goals can be found in the care plan section) Acute Rehab OT Goals Patient Stated Goal: home independent OT Goal Formulation: With patient Time For Goal Achievement: 08/21/16 Potential to Achieve Goals: Good ADL Goals Pt Will Perform Tub/Shower Transfer: with supervision;ambulating;shower seat;rolling walker Additional ADL Goal #1: Pt will independently verbally recall 3/3 back precautions and maintain throughout ADL. Additional ADL Goal #2: Pt will don/doff back brace with set up as precursor  for ADL and functional mobility.  OT Frequency: Min 2X/week   Barriers to D/C:            Co-evaluation              End of Session Equipment Utilized During Treatment: Rolling walker;Back brace Nurse Communication: Mobility status  Activity Tolerance: Patient tolerated treatment well Patient left: in chair;with call bell/phone within reach;with family/visitor present   Time: [id number] OT Time Calculation (min): 16 min Charges:  OT General Charges $OT Visit: 1 Procedure OT Evaluation $OT Eval Moderate Complexity: 1 Procedure G-Codes:     Gaye AlkenBailey A Ryman Rathgeber M.S., OTR/L Pager: [phone]  08/07/2016, 2:43 PM

## 2016-08-07 NOTE — Op Note (Signed)
08/06/2016  3:19 PM  PATIENT:  [name]  40 y.o. male  MRN: [id number]  OPERATIVE REPORT  PRE-OPERATIVE DIAGNOSIS:  Left L3-4 lateral herniated nucleus pulposus, L4-5 spinal stenosis with degenerative disc disease, foraminal stenosis  POST-OPERATIVE DIAGNOSIS:  Left L3-4 lateral herniated nucleus pulposus, L4-5 spinal stenosis with degenerative disc disease, foraminal stenosis  PROCEDURE:  Procedure(s): Left L3-4 and Right L4-5 transforaminal lumbar interbody fusion with pedicle screws and rods, interbody cages, local bone graft, allograft and Vivigen    SURGEON:  Jessy Oto, MD     ASSISTANT:  Benjiman Core, PA-C  (Present throughout the entire procedure and necessary for complJames Owensetion of procedure in a timely manner)     ANESTHESIA:  General,    COMPLICATIONS:  None.     COMPONENTS:  Implant Name Type Inv. Item Serial No. Manufacturer Lot No. LRB No. Used  BONE CANC CHIPS 20CC - [id number] Bone Implant BONE CANC CHIPS 20CC [id number] [facility]  N/A 1  BONE MATRIX VIVIGEN 5CC - [id number] Bone Implant BONE MATRIX VIVIGEN 5CC [id number] [facility] [place] [facility]  N/A 1  SPACER 10X28 TPAL - [id number] Spacer SPACER 10X28 TPAL  SYNTHES TRAUMA  N/A 1  SCREW CORTICAL VIPER 7X40MM - [id number] Screw SCREW CORTICAL VIPER 7X40MM  DEPUY SPINE  N/A 5  SCREW SET SINGLE INNER - [id number] Screw SCREW SET SINGLE INNER  DEPUY SPINE  N/A 6  ROD EXPEDIUM PRE BENT 5.5X75 - [id number] Rod ROD EXPEDIUM PRE BENT 5.5X75  DEPUY SPINE  N/A 1  SCREW CORTICAL VIPER 7X45MM - [id number] Screw SCREW CORTICAL VIPER 7X45MM  DEPUY SPINE  N/A 1  CAGE CONCORDE BULLET [id number] - [id number] Cage CAGE CONCORDE BULLET [id number]  DEPUY SPINE  N/A 1  ROD EXPEDIUM PRE BENT 5.5X75 - [id number] Rod ROD EXPEDIUM PRE BENT 5.5X75  DEPUY SPINE  N/A 1  ROD EXPEDIUM PREBENT 85MM - [id number] Rod ROD EXPEDIUM PREBENT 85MM  DEPUY SPINE  N/A 1  CONNECTOR CROSS SFX A2 - [id number] Connector  CONNECTOR CROSS SFX A2   DEPUY SPINE   N/A 1     PROCEDURE:    The patient was met in the holding area, and the appropriate lumbar levels left L4-5 and L3-4 identified and marked with an "X" and my initials. I had discussion with the patient in the preop holding area regarding consent for surgery and risks and benifits.The fusion levels are reidentified as left L4-5 and L3-4. Patient understands the rationale to perform TLIFs at two levels to decompress the bilateral L3-4 and L4-5 lateral recess and foramenal stenosis and to allow for some presevation of lordosis. The patient was then transported to OR and was placed under general anestheticwithout difficulty. The patient received appropriate preoperative antibiotic prophylaxis 3 gm Ancef.  Nursing staff inserted a Foley catheter under sterile conditions. The patient was then turned to a prone position using the Gillespie spine frame. PAS. all pressure points well padded the arms at the side to 90 90. Standard prep with DuraPrep solution draped in the usual manner from the lower dorsal spine the mid sacral segment. Iodine Vi-Drape was used and the old incision scar was marked. Time-out procedure was called and correct. Skin in the midline between L2 and S1 was then infiltrated with local anesthesia, marcaine 1/2% 1:1 exparel 1.3% total 30 cc used. Incision was then made  extending from L3-S1  through the skin and subcutaneous layers down to the patient's lumbodorsal fascia and spinous processes. The incision then  carried sharply excising the supraspinous ligament and then continuing the lateral aspect of the spinous processes of L2, L3, L4 and L5. Cobb elevator used to carefully elevate the paralumbar muscles off of the posterior elements using electrocautery carefully drilled bleeding and perform dissection of the muscle tissues of the preserving the facet capsule at the L2-3. Continuing the exposure out laterally to expose the lateral margin of the facet  joint line at L2-3, L3-4 and L4-5 . Incision was carried in the midline down to the S1 level area bleeders controlled using electrocautery monopolar electrocautery.  Viper self retaining retractors were used for the incision. C-arm fluoroscopy was then brought into the field and using C-arm fluoroscopy then a hole made into the medial aspect of the pedicle of right L3 observed in the pedicle using C arm at the 5 oclock position on the right L3 pedicle nerve probe initial entry was determined on fluoroscopy to be good position alignment so that a 4.66m tap was passed to 40 mm within the right L3 pedicle to a depth of nearly 40 mm observed on C-arm fluoroscopy to be beyond the midpoint of the lumbar vertebra and then position alignment within the right L3 pedicle this was then removed and the pedicle channel probed demonstrating patency no sign of rupture the cortex of the pedicle. Tapping with a 4 mm screw tap then 5 mm tap then 6.0 mm tap and then a 7.0 mm  Tap a 7.0 mm x 40 mm screw was placed on the right side at the L3 level. C-arm fluoroscopy was then brought into the field and using C-arm fluoroscopy then a hole made into the posterior medial aspect of the pedicle of right L4 observed in the pedicle using ball tipped nerve hook and hockey stick nerve probe initial entry was determined on fluoroscopy to be good position alignment so that 4.043mtap was then used to tap the right L4 pedicle to a depth of nearly 40 mm observed on C-arm fluoroscopy to be beyond the midpoint of the lumbar vertebra and then position alignment within the right L4 pedicle this was then removed and the pedicle channel probed demonstrating patency no sign of rupture the cortex of the pedicle. Tapping with a 5 mm screw tap then a 6.0 mm tap and then a 7.0 mm tap, a  7.0 mm x 40 mm screw was placed on right L4 side. C-arm fluoroscopy was then brought into the field and using C-arm fluoroscopy then a hole made into the posterior and medial  aspect of the right pedicle of L4 observed in the pedicle using ball tipped nerve hook and hockey stick nerve probe initial entry was determined on fluoroscopy to be good position alignment so that a 4.57m64map was then used to tap the right L4 pedicle to a depth of nearly 40 mm observed on C-arm fluoroscopy to be beyond the posterior one third of the lumbar vertebra and good position alignment within the right L4 pedicle this was then removed and the pedicle channel probed demonstrating patency no sign of rupture the cortex of the pedicle. Tapping with a 5 mm screw tap then a 6.0 mm tap and then a 7.0 mm tap,  then 7.57mm73m40 mm screw was placed on the right side at the L4 level. The pedicle channel of L4 on the right probed demonstrating patency no sign of rupture the cortex of the pedicle. Viper screw for fixation of this level was measured as 7.0 mm x 40  mm screw so  was placed on the right side at the L4 level. C-arm fluoroscopy was then brought into the field and using C-arm fluoroscopy then a hole made into the posterior and medial aspect of the right pedicle of L5 observed in the pedicle using ball tipped nerve hook and hockey stick nerve probe initial entry was determined on fluoroscopy to be good position alignment so that a 4.76m tap was then used to tap the right L5 pedicle to a depth of nearly 40 mm observed on C-arm fluoroscopy to be beyond the posterior one third of the lumbar vertebra and good position alignment within the right L5 pedicle this was then removed and the pedicle channel probed demonstrating patency no sign of rupture the cortex of the pedicle. Tapping with a 5 mm screw tap and then a 6.0 mm tap and then a 7.0 mm tap,  Then a 7.048mx 40 mm screw was placed on the right side at the L5  level. The pedicle channel of L4 on the right probed demonstrating patency no sign of rupture the cortex of the pedicle. Viper screw for fixation of this level was measured as 7.0 mm x 40 mm screw placed on  the the right side at the L5 level. C-arm fluoroscopy was then brought into the field and using C-arm fluoroscopy then a hole made into the posterior and medial aspect of the left pedicle of L5 observed in the pedicle using ball tipped nerve hook and hockey stick nerve probe initial entry was determined on fluoroscopy to be good position alignment so that a 4.19m719map was then used to tap the left L5 pedicle to a depth of nearly 40 mm observed on C-arm fluoroscopy to be beyond the posterior one third of the lumbar vertebra and good position alignment within the left L5 pedicle this was then removed and the pedicle channel probed demonstrating patency no sign of rupture the cortex of the pedicle. Tapping with a 5 mm screw tap then a 6.0 mm tap,  then 7.19mm64mp then a 7.19mm 3m0 mm screw was placed on the left side at the L5 level. The pedicle channel of L5 on the left probed demonstrating patency no sign of rupture the cortex of the pedicle. Viper screw for fixation of this level was measured as 7.0 mm x 40 mm screw so  was placed on the left side at the L5 level. C-arm fluoroscopy was then brought into the field and using C-arm fluoroscopy then a hole made into the posterior and medial aspect of the left pedicle of L4 observed in the pedicle using ball tipped nerve hook and hockey stick nerve probe initial entry was determined on fluoroscopy to be good position alignment so that a 4.19mm t37mwas then used to tap the left L4 pedicle to a depth of nearly 40 mm observed on C-arm fluoroscopy to be beyond the posterior one third of the lumbar vertebra and good position alignment within the left L4 pedicle this was then removed and the pedicle channel probed demonstrating patency no sign of rupture the cortex of the pedicle. Tapping with a 5 mm screw tap and then a 6.0 mm tap and 7.0 mm tap, The pedicle channel of L4 on the left probed demonstrating patency no sign of rupture the cortex of the pedicle. Viper screw for  fixation of this level was measured as 7.0 mm x 40 mm screw left on the field for insertion after L4-5 decompression and TLIF. Following  tapping with a 4 mm, 21m and 6 mm taps a 7.0 x 40 mm screw was placed on the table to be inserted following decompression and TLIF. C-arm fluoroscopy was then brought into the field and using C-arm fluoroscopy then a hole made into the posterior and medial aspect of the left pedicle of L3 observed in the pedicle using ball tipped nerve hook and hockey stick nerve probe initial entry was determined on fluoroscopy to be good position alignment so that a 4.048mtap was then used to tap the left L3 pedicle to a depth of nearly 45 mm observed on C-arm fluoroscopy to be beyond the posterior one third of the lumbar vertebra and good position alignment within the left L3 pedicle this was then removed and the pedicle channel probed demonstrating patency no sign of rupture the cortex of the pedicle. Tapping with a 5 mm screw tap and then a 6.0 mm tap and 7.0 mm tap, The pedicle channel of L3 on the left probed demonstrating patency no sign of rupture the cortex of the pedicle. Viper screw for fixation of this level was measured as 7.0 mm x 45 mm screw left on the field for insertion after L4-5 decompression and TLIF. Following tapping with a 4 mm, 44m244mnd 6 mm taps a 7.0 x 444m13mrew was placed on the table to be inserted following decompression and TLIF. Flow Seal was used for hemostasis of the left L3 and L4 pedicle screw holes.  The spinous process and central lamina at L4 and  the lower 50% L3 segment was resected and Leksell rongeur used to resect inferior aspect of the lamina on the left side at the L3 and L4 level. The right medial 40% of the facet of L4-5 and L3-4 were resected in order to decompress the right side of the lumbar thecal sac at  L4-5 and L3-4 and decompress the right L3, L4 and L5 neuroforamen. Osteotomes and 2mm 344m 3mm k58misons were used for this portion of the  decompression. Nearly complete facetectomies were perform on the left at L4-5 and L3-4 to provide for exposure of the left side L4-5 and L3-4 neuroforamen for ease of placement of TLIFs (transforaminal lumbar interbody fusion) at each level inferior portions of the lamina and pars were also resected first beginning with the Leksell rongeur and osteotomes and then resecting using 2 and 3 mm Kerrison. Sterilely draped OR Microscope was used for this portion of the procedure. Continued laminectomy was carried out resecting the central portions of the lamina of L4 and inferior 50%L3 performing foraminotomies on the left and right sides at the L3, L4 and L5 levels. The inferior articular process L4 and L3 were resected on the left side. The left L5 nerve root identified and the medial aspect of the L5 pedicle. Superior articular process of L5 was then resected from the left side further decompressing the left L5 nerve and providing for exposure of the area just superior to the L5 pedicle for a placement of L4-5 cage. A large amount of hypertrophic ligmentum flavum was found impressing on the left lateral recesses at L4-5 and L3-4 and narrowing the respective L4 and L5 neuroforamen.Medial superior articular process of L4 was removed as a unit. Attention then turned to placement of the transforaminal lumbar interbody fusion cages. Using a Penfield 4 the left lateral aspect of the thecal sac at the L4-5 disc space was carefully freed up The thecal sac could then easily be retracted in the posterior lateral  aspect of the L4-5 disc was exposed 15 blade scalpel used to incise the posterolateral disc and an osteotome used to resect a small portion of bone off the superior aspect of the posterior superior vertebral body of L5 in order to ease the entry into the L4-5 disc space. A  5m kerrison rongeur was then able to be introduced in the disc space debrided it was quite narrow. 7 mm dilator was used to dialate the L4-5 disc  space on the left side attempts were made to dilate further the 8 mm, 988mand then 1029mere successful and using small curettes and the disc space was debrided a minimal degenerative disc present in the endplates debrided to bleeding endplate bone. Shavers were inserted to trial the intervertebral disc space.Trial cage spacer then placed and a 71m50mdth spacer provided the best fit at the L4-5 level. A 10 mm x 28mm38mhes lordotic TPAL cage was carefully packed with morcellized bone graft and the been harvested from previous laminotomies.The cage was then inserted with the articulating insertion handle.  Additional bone graft was then packed into the intervertebral disc space. Bleeding controlled using bipolar electrocautery thrombin soaked gel cottonoids. Then turned to the left L3-4 level similarly the exposure the posterior lateral aspect this was carried out using a Penfield 4 bipolar electrocautery to control small bleeders present. Derricho retractor used to retract the thecal sac and L3 nerve root a 15 blade scalpel was used to incise posterior lateral aspect of the left L3-4 disc the disc space at this level showed a rather large space posteriorly was more open anteriorly.The space was debrided of degenerative disc material using pituitary along root the entire disc space was then debrided of degenerative disc material using pituitary rongeurs curettage down to bleeding bone endplates. 8 mm and 9 mm shavers were used to debride the disc space and pituitary ronguers used to remove the loosened debris. This space was then carefully assess using spacers  a 8.0mm, 95mm, 10 mm,11 mm and 12 mm trial cage provided the best fit, the Depuy concorde bullet lordotic cage 12mm x28mm wa66mosen so that the permanent 12.0 mm cage by 28 mm Lordotic concorde cage packed with local bone graft and vivigen was placed into the intervertebral disc space. The posterior intervertebral disc space was then packed with autogenous  local bone graft that been harvested from the central laminectomy. Bleeding controlled using bipolar electrocautery.  Observed on C-arm fluoroscopy to be in good position alignment. The cages at L4-5 and L3-4 were placed anteriorly as best as possible to maintain lumbar lordosis that was present. With this then the transforaminal lumbar interbody fusion portion of the case was completed bleeders were controlled using bipolar electrocautery thrombin-soaked Gelfoam were appropriate.Decortication of the facet joints carried out bilateral L4-5 and L3-4. These were packed with cancellous local bone graft. 3 pedicle screws on the right and the left L5 pedicle screw already in place, the 2 viper corticofixation screws on the left at L3 and L4 were each placed and then each fastener carefully aligned  to allow for placement of rods. The right side first quarter inch titanium rod was then carefully contoured using the french benders and a precontoured 85mm rod70mis was then placed into the pedicle screws on the right extending from L3-L5 each of the caps were carefully placed loosely tightened. Attention turned to the left side were similarly and then screws were carefully adjusted to allow for a better pattern screws to  allow for placement of fixation of the rod a quarter inch 75 mm precontoured titanium rod was then carefully contoured. This was able to be inserted into the left pedicle screw fasteners, Caps onto the L3 fasteners were tightened to 80 foot lbs. Across the left side  L3-4 and L4-5 screw fasteners compression was obtained on the left side between L3 and L4, then L4 and L5 by compressing between the fasteners and tightening the screw caps 85 pounds. Similarly this was done on the right side at L3-4 and L4-5 obtaining compression and tightened 85 pounds. Irrigation was carried out with copious amounts of saline solution this was done throughout the case. Cell Saver was used during the case. A total of 800 cc  of cell saver blood was returned to the patient. A cross link was measured to be an A-2 crosslink size at the L3-4 interpedicular screw level. The crosslink then placed and the locking mechanism for the connectors to the rods engaged and tightened to 85 foot lbs. The central connector for the cross link then tightened to 85 foot-lbs.Hockey stick neuroprobe was used to probe the neuroforamen bilateral L3, L4 and L5, these were determined to be well decompressed. Permanent C-arm images were obtained in AP and lateral plane and oblique planes. Remaining local bone graft was then applied along both lateral posterior lateral region extending from L3 to L5 facet beds.Gelfoam was then removed spinal canal. The lumbodorsal musculature carefully exam debrided of any devitalized tissue following removal of cerebellar retractors were the bleeders were controlled using electrocautery and the area dorsal lumbar muscle were then approximated in the midline with interrupted #1 Vicryl sutures loose the dorsal fascia was reattached to the spinous process of L2 to superiorly and L5  inferiorly this was done with #1 Vicryl sutures. Subcutaneous layers then approximated using interrupted 0 Vicryl sutures and 2-0 Vicryl sutures. Skin was closed with a running subcutaneous stitch of 4-0 Vicryl Dermabond was applied then MedPlex bandage. All instrument and sponge counts were correct. The patient was then returned to a supine position on her bed reactivated extubated and returned to the recovery room in satisfactory condition.     Benjiman Core, PA-C perform the duties of assistant surgeon during this case. He was present from the beginning of the case to the end of the case assisting in transfer the patient from her stretcher to the OR table and back to the stretcher at the end of the case. Assisted in careful retraction and suction of the laminectomy site delicate neural structures operating under the operating room microscope. He  performed closure of the incision from the fascia to the skin applying the dressing.     NITKA,JAMES E  08/06/2016, 3:19 PM

## 2016-08-07 NOTE — Progress Notes (Signed)
Pt girlfriend verbally aggressive and threatening to this nurse this am. Alarm went off in pt room this nurse reported to room to address alarm.  Pt girlfriend noted slamming doors cursing. Pt and girlfriend upset was informed that security will be called if behavior continued. Reported to charge nurse who went to speak with pt and girlfriend returned stated that girlfriend and pt requested a new nurse because they will not allow a person of color to talk them any kind of way.

## 2016-08-07 NOTE — Care Management Note (Signed)
Case Management Note  Patient Details  Name: [name] MRN: [id number] Date of Birth: December 29, 1975  Subjective/Objective:                    Action/Plan: Plan is for patient to discharge home with [facility] services when medically ready. CM met with the patient and provided him a list of [place] agencies in the [place] area. He selected [facility]. CM spoke with [facility] and faxed them the information they requested. Pt also ordered a rolling walker. Jermaine with [facility] notified and will deliver the equipment to the room.   Expected Discharge Date:                  Expected Discharge Plan:  [place]  In-House Referral:     Discharge planning Services  CM Consult  Post Acute Care Choice:  Durable Medical Equipment, Home Health Choice offered to:  Patient  DME Arranged:  Walker rolling DME Agency:  [place]:  PT [place] Agency:  [facility]  Status of Service:  Completed, signed off  If discussed at [place] of Stay Meetings, dates discussed:    Additional Comments:  Pollie Friar, RN 08/07/2016, 2:29 PM

## 2016-08-07 NOTE — Progress Notes (Signed)
Spoke to [employer] for patient's brace this AM. Pt's weight at 385 lbs and 5'11 1/2" length. Tech will bring brace. At this time. Pt advise to follow back precautions. Will continue to monitor.   Sim BoastHavy, RN

## 2016-08-07 NOTE — Progress Notes (Signed)
Orthopedic Tech Progress Note Patient Details:  Bobby GangCharles E Ramirez Sep 17, 1976 [id number]  Patient ID: Bobby Gangharles E Ramirez, male   DOB: Sep 17, 1976, 40 y.o.   MRN: [id number]   [name], [name] 08/07/2016, 9:31 AM Called in bio-tech brace order; spoke with Judeth CornfieldStephanie

## 2016-08-08 LAB — CBC
HCT: 35.6 % — ABNORMAL LOW (ref 39.0–52.0)
HEMOGLOBIN: 12.2 g/dL — AB (ref 13.0–17.0)
MCH: 31 pg (ref 26.0–34.0)
MCHC: 34.3 g/dL (ref 30.0–36.0)
MCV: 90.6 fL (ref 78.0–100.0)
Platelets: 147 10*3/uL — ABNORMAL LOW (ref 150–400)
RBC: 3.93 MIL/uL — ABNORMAL LOW (ref 4.22–5.81)
RDW: 12.8 % (ref 11.5–15.5)
WBC: 9.7 10*3/uL (ref 4.0–10.5)

## 2016-08-08 LAB — BASIC METABOLIC PANEL
Anion gap: 6 (ref 5–15)
BUN: 10 mg/dL (ref 6–20)
CALCIUM: 8.7 mg/dL — AB (ref 8.9–10.3)
CHLORIDE: 103 mmol/L (ref 101–111)
CO2: 29 mmol/L (ref 22–32)
CREATININE: 0.89 mg/dL (ref 0.61–1.24)
Glucose, Bld: 104 mg/dL — ABNORMAL HIGH (ref 65–99)
Potassium: 4.6 mmol/L (ref 3.5–5.1)
SODIUM: 138 mmol/L (ref 135–145)

## 2016-08-08 NOTE — Progress Notes (Signed)
     Subjective: 2 Days Post-Op Procedure(s) (LRB): Left L3-4 and Right L4-5 transforaminal lumbar interbody fusion with pedicle screws and rods, interbody cages, local bone graft, allograft and Vivigen (N/A) Awake, alert and oriented x 4. Voiding without difficulty, passing flatus. Tolerating po meds. PT/OT today and plan for discharge then in AM if he continues to do well. He is Being taken outside with wife occasionally, ok as long as he is not smoking. Valium for anxiiety. Patient reports pain as moderate.    Objective:   VITALS:  Temp:  [97.9 F (36.6 C)-99.2 F (37.3 C)] 98 F (36.7 C) (09/14 1746) Pulse Rate:  [69-84] 79 (09/14 1746) Resp:  [18-20] 18 (09/14 1746) BP: (100-125)/(47-72) 102/54 (09/14 1746) SpO2:  [94 %-100 %] 94 % (09/14 1746)  Neurologically intact ABD soft Neurovascular intact Sensation intact distally Intact pulses distally Dorsiflexion/Plantar flexion intact Incision: no drainage   LABS  Recent Labs  08/07/16 1306 08/08/16 0701  HGB 11.5* 12.2*  WBC 9.1 9.7  PLT 137* 147*    Recent Labs  08/07/16 1306 08/08/16 0701  NA 137 138  K 4.0 4.6  CL 106 103  CO2 26 29  BUN 15 10  CREATININE 1.02 0.89  GLUCOSE 143* 104*   No results for input(s): LABPT, INR in the last 72 hours.   Assessment/Plan: 2 Days Post-Op Procedure(s) (LRB): Left L3-4 and Right L4-5 transforaminal lumbar interbody fusion with pedicle screws and rods, interbody cages, local bone graft, allograft and Vivigen (N/A)  Up with therapy D/C IV fluids Plan for discharge tomorrow Discharge home with home health  Incentive spirometry,   Missie Gehrig E 08/08/2016, 9:36 PM

## 2016-08-08 NOTE — Progress Notes (Signed)
Physical Therapy Treatment Patient Details Name: [name] MRN: [id number] DOB: 06-03-1976 Today's Date: 08/08/2016    History of Present Illness pt is a 40 y/o male with h/o neuropathy, ADHD, HTN, admitted with L L34 HNP and L45 stenosis with DDD, s/p L34 and R 45 TLIF.    PT Comments    Patient tolerated gait/stair training well. Needs cues to maintain back precautions when performing bed mobility/transfers. Reviewed activity progression, donning/use of brace, and precautions during session. Current plan remains appropriate.   Follow Up Recommendations  No PT follow up;Supervision for mobility/OOB     Equipment Recommendations  Rolling walker with 5" wheels    Recommendations for Other Services       Precautions / Restrictions Precautions Precautions: Back;Fall Precaution Comments: reviewed precuations Required Braces or Orthoses: Spinal Brace Spinal Brace: Lumbar corset;Applied in sitting position Restrictions Weight Bearing Restrictions: No    Mobility  Bed Mobility Overal bed mobility: Needs Assistance Bed Mobility: Sidelying to Sit;Sit to Sidelying   Sidelying to sit: Supervision     Sit to sidelying: Min guard General bed mobility comments: cues for technique/sequencing with carry over demonstrated but still needed cues to maintain back precautions especially for returning to sidelying position  Transfers Overall transfer level: Modified independent Equipment used: Rolling walker (2 wheeled) Transfers: Sit to/from Stand Sit to Stand: Modified independent (Device/Increase time)         General transfer comment: increased time and RW upon stand but steady without RW  Ambulation/Gait Ambulation/Gait assistance: Supervision Ambulation Distance (Feet): 200 Feet Assistive device: Rolling walker (2 wheeled);None Gait Pattern/deviations: Step-through pattern Gait velocity: slower   General Gait Details: safe use of AD and good  posture   Stairs Stairs: Yes Stairs assistance: Min guard Stair Management: One rail Right;Forwards;Step to pattern Number of Stairs: 2 General stair comments: cues for sequencing and step to pattern; one hand rail to simulate home  Wheelchair Mobility    Modified Rankin (Stroke Patients Only)       Balance     Sitting balance-Leahy Scale: Good       Standing balance-Leahy Scale: Good                      Cognition Arousal/Alertness: Awake/alert Behavior During Therapy: WFL for tasks assessed/performed Overall Cognitive Status: Within Functional Limits for tasks assessed                      Exercises      General Comments General comments (skin integrity, edema, etc.): reviewed positioning, donning/use of brace, and activity progression with pt      Pertinent Vitals/Pain Pain Assessment: 0-10 Pain Score: 9  Pain Location: back and L LE Pain Descriptors / Indicators: Aching;Guarding;Sore Pain Intervention(s): Limited activity within patient's tolerance;Monitored during session;Premedicated before session;Repositioned    Home Living                      Prior Function            PT Goals (current goals can now be found in the care plan section) Acute Rehab PT Goals Patient Stated Goal: home independent PT Goal Formulation: With patient Time For Goal Achievement: 08/14/16 Potential to Achieve Goals: Good Progress towards PT goals: Progressing toward goals    Frequency  Min 5X/week    PT Plan Current plan remains appropriate    Co-evaluation  End of Session Equipment Utilized During Treatment: Back brace Activity Tolerance: Patient tolerated treatment well Patient left: with call bell/phone within reach;in bed;with family/visitor present     Time: [id number] PT Time Calculation (min) (ACUTE ONLY): 16 min  Charges:  $Gait Training: 8-22 mins                    G Codes:      Derek MoundKellyn R Ameir Faria  Zaryan Yakubov, PTA Pager: [phone]   08/08/2016, 10:53 AM

## 2016-08-08 NOTE — Progress Notes (Signed)
OT Cancellation Note  Patient Details Name: Dante GangCharles E Mihalko MRN: [id number] DOB: 04-Jul-1976   Cancelled Treatment:    Reason Eval/Treat Not Completed: Other (comment) (pt not in room; RN reports he is outside). Will follow up as time allows.  Gaye AlkenBailey A Bryson Gavia M.S., OTR/L Pager: [phone]  08/08/2016, 8:46 AM

## 2016-08-08 NOTE — Progress Notes (Signed)
Occupational Therapy Treatment Patient Details Name: [name] MRN: [id number] DOB: 23-Sep-1976 Today's Date: 08/08/2016    History of present illness pt is a 40 y/o male with h/o neuropathy, ADHD, HTN, admitted with L L34 HNP and L45 stenosis with DDD, s/p L34 and R 45 TLIF.   OT comments  Pt able to meet all OT goals adequately for d/c from acute OT services. Pt able to perform tub transfer with min guard assist and don brace with set up. Educated pt and pts mother on safety with tub transfer and bathing; they verbalized understanding. Pt able to recall 3/3 back precautions and maintain throughout activities in session. No further acute OT needs identified; signing off at this time. Please re-consult if needs change.    Follow Up Recommendations  No OT follow up;Supervision/Assistance - 24 hour (initially)    Equipment Recommendations  None recommended by OT    Recommendations for Other Services      Precautions / Restrictions Precautions Precautions: Back;Fall Precaution Comments: Pt able to verbally recall 3/3 back precautions and maintain during functional activity. Required Braces or Orthoses: Spinal Brace Spinal Brace: Lumbar corset;Applied in sitting position Restrictions Weight Bearing Restrictions: No       Mobility Bed Mobility  General bed mobility comments: Pt OOB upon arrival.  Transfers          General transfer comment: Only performed stand to sit into chair with supervision for safety.    Balance Overall balance assessment: Needs assistance   Sitting balance-Leahy Scale: Good     Standing balance support: No upper extremity supported;During functional activity Standing balance-Leahy Scale: Good                     ADL Overall ADL's : Needs assistance/impaired                 Upper Body Dressing : Set up;Standing Upper Body Dressing Details (indicate cue type and reason): Pt found ambulating in room without brace. Donned  brace in standing. Pt able to complete with set up.     Toilet Transfer: Supervision/safety;Ambulation       Tub/ Shower Transfer: Tub transfer;Min guard;Ambulation;Shower seat;Grab Paediatric nurse Details (indicate cue type and reason): Pt able to perform tub transfer with min guard for safety. Pts mother reports she has shower chair at home; recommended use of shower chair and supervision for safety with tub transfer upon return home. Functional mobility during ADLs: Supervision/safety General ADL Comments: Reviewed maintaining back precautions during functional activities; pt verbalized understanding and able to demo adherence to precautions during functional activities in session.      Vision                     Perception     Praxis      Cognition   Behavior During Therapy: WFL for tasks assessed/performed Overall Cognitive Status: Within Functional Limits for tasks assessed                       Extremity/Trunk Assessment               Exercises     Shoulder Instructions       General Comments      Pertinent Vitals/ Pain       Pain Assessment: Faces Pain Score: 9  Faces Pain Scale: Hurts little more Pain Location: back Pain Descriptors / Indicators: Sore Pain Intervention(s): Monitored during session;Repositioned  Home Living                                          Prior Functioning/Environment              Frequency       Progress Toward Goals  OT Goals(current goals can now be found in the care plan section)  Progress towards OT goals: Goals met/education completed, patient discharged from OT  Acute Rehab OT Goals Patient Stated Goal: home independent OT Goal Formulation: All assessment and education complete, DC therapy  Plan Discharge plan remains appropriate;All goals met and education completed, patient discharged from OT services;Equipment recommendations need to be updated     Co-evaluation                 End of Session Equipment Utilized During Treatment: Back brace   Activity Tolerance Patient tolerated treatment well   Patient Left in chair;with call bell/phone within reach;with family/visitor present   Nurse Communication          Time: 1135-1145 OT Time Calculation (min): 10 min  Charges: OT General Charges $OT Visit: 1 Procedure OT Treatments $Self Care/Home Management : 8-22 mins  Binnie Kand M.S., OTR/L Pager: [phone]  08/08/2016, 11:59 AM

## 2016-08-08 NOTE — Progress Notes (Signed)
Patient removed nicotine patch and informed staff stating "I think this is giving me a headache, I don't want to wear it anymore." Patch obtained by RN, educated patient. Will continue to monitor.

## 2016-08-09 LAB — BASIC METABOLIC PANEL
ANION GAP: 6 (ref 5–15)
BUN: 12 mg/dL (ref 6–20)
CO2: 29 mmol/L (ref 22–32)
Calcium: 8.8 mg/dL — ABNORMAL LOW (ref 8.9–10.3)
Chloride: 100 mmol/L — ABNORMAL LOW (ref 101–111)
Creatinine, Ser: 0.93 mg/dL (ref 0.61–1.24)
GFR calc Af Amer: >60 mL/min (ref >60)
GFR calc non Af Amer: >60 mL/min (ref >60)
GLUCOSE: 129 mg/dL — AB (ref 65–99)
POTASSIUM: 4.1 mmol/L (ref 3.5–5.1)
Sodium: 135 mmol/L (ref 135–145)

## 2016-08-09 LAB — CBC WITH DIFFERENTIAL/PLATELET
BASOS ABS: 0 10*3/uL (ref 0.0–0.1)
Basophils Relative: 0 %
EOS PCT: 2 %
Eosinophils Absolute: 0.2 10*3/uL (ref 0.0–0.7)
HEMATOCRIT: 33.4 % — AB (ref 39.0–52.0)
Hemoglobin: 11.3 g/dL — ABNORMAL LOW (ref 13.0–17.0)
LYMPHS ABS: 2.4 10*3/uL (ref 0.7–4.0)
LYMPHS PCT: 28 %
MCH: 30.6 pg (ref 26.0–34.0)
MCHC: 33.8 g/dL (ref 30.0–36.0)
MCV: 90.5 fL (ref 78.0–100.0)
MONO ABS: 0.6 10*3/uL (ref 0.1–1.0)
Monocytes Relative: 7 %
NEUTROS ABS: 5.4 10*3/uL (ref 1.7–7.7)
Neutrophils Relative %: 63 %
Platelets: 156 10*3/uL (ref 150–400)
RBC: 3.69 MIL/uL — AB (ref 4.22–5.81)
RDW: 12.4 % (ref 11.5–15.5)
WBC: 8.7 10*3/uL (ref 4.0–10.5)

## 2016-08-09 MED ORDER — GABAPENTIN 400 MG PO CAPS
800.0000 mg | ORAL_CAPSULE | Freq: Three times a day (TID) | ORAL | Status: DC
  Administered 2016-08-09 (×2): 800 mg via ORAL
  Filled 2016-08-09 (×2): qty 2

## 2016-08-09 MED ORDER — OXYCODONE-ACETAMINOPHEN 5-325 MG PO TABS: 1.0000 | ORAL_TABLET | ORAL | 0 refills | Status: DC | PRN

## 2016-08-09 MED ORDER — MORPHINE SULFATE 15 MG PO TABS: 15.0000 mg | ORAL_TABLET | Freq: Four times a day (QID) | ORAL | 0 refills | Status: DC | PRN

## 2016-08-09 NOTE — Progress Notes (Signed)
     Subjective: 3 Days Post-Op Procedure(s) (LRB): Left L3-4 and Right L4-5 transforaminal lumbar interbody fusion with pedicle screws and rods, interbody cages, local bone graft, allograft and Vivigen (N/A) Awake,alert and oriented x 4. Voiding but no BM. C/O left leg hurting him. Patient reports pain as moderate.    Objective:   VITALS:  Temp:  [97.4 F (36.3 C)-98.7 F (37.1 C)] 98.1 F (36.7 C) (09/15 0800) Pulse Rate:  [63-81] 63 (09/15 0800) Resp:  [15-18] 15 (09/15 0800) BP: (100-125)/(46-72) 107/48 (09/15 0800) SpO2:  [91 %-99 %] 91 % (09/15 0800)  Neurologically intact ABD soft Neurovascular intact Sensation intact distally Intact pulses distally Dorsiflexion/Plantar flexion intact Incision: no drainage   LABS  Recent Labs  08/07/16 1306 08/08/16 0701  HGB 11.5* 12.2*  WBC 9.1 9.7  PLT 137* 147*    Recent Labs  08/07/16 1306 08/08/16 0701  NA 137 138  K 4.0 4.6  CL 106 103  CO2 26 29  BUN 15 10  CREATININE 1.02 0.89  GLUCOSE 143* 104*   No results for input(s): LABPT, INR in the last 72 hours.   Assessment/Plan: 3 Days Post-Op Procedure(s) (LRB): Left L3-4 and Right L4-5 transforaminal lumbar interbody fusion with pedicle screws and rods, interbody cages, local bone graft, allograft and Vivigen (N/A)  Up with therapy Discharge home with home health  Will reassess this afternoon. Change gabapentin to TID and check lab Stimulate bowels  NITKA,JAMES E 08/09/2016, 8:39 AM

## 2016-08-09 NOTE — Progress Notes (Addendum)
Physical Therapy Treatment Patient Details Name: [name] MRN: [id number] DOB: 1976/10/05 Today's Date: 08/09/2016    History of Present Illness pt is a 40 y/o male with h/o neuropathy, ADHD, HTN, admitted with L L34 HNP and L45 stenosis with DDD, s/p L34 and R 45 TLIF.    PT Comments    Pt progressing well; all questions and concerns addressed; pt is hopeful to D/C today  Follow Up Recommendations  Pt and family request HHPTSupervision for mobility/OOB     Equipment Recommendations  Rolling walker with 5" wheels    Recommendations for Other Services       Precautions / Restrictions Precautions Precautions: Back;Fall Precaution Comments: Pt able to verbally recall 3/3 back precautions and maintain during functional activity--although his significant other reports he has not been adhering to them consistently; required cues to tighten corset appropriately Required Braces or Orthoses: Spinal Brace Spinal Brace: Lumbar corset;Applied in sitting position Restrictions Weight Bearing Restrictions: No    Mobility  Bed Mobility Overal bed mobility: Needs Assistance Bed Mobility: Sidelying to Sit;Sit to Sidelying Rolling: Supervision Sidelying to sit: Supervision     Sit to sidelying: Supervision General bed mobility comments: occasional cues for back precautions/minor adjustments to maintain proper positioning  Transfers Overall transfer level: Modified independent Equipment used: Rolling walker (2 wheeled) Transfers: Sit to/from Stand Sit to Stand: Modified independent (Device/Increase time)         General transfer comment: pt utilizes correct technique and posture  Ambulation/Gait Ambulation/Gait assistance: Supervision;Modified independent (Device/Increase time) Ambulation Distance (Feet): 300 Feet Assistive device: Rolling walker (2 wheeled);None Gait Pattern/deviations: Step-through pattern     General Gait Details: safe use of AD and good posture,  pt able to amb ~10  without AD, supervision for safety   Stairs            Wheelchair Mobility    Modified Rankin (Stroke Patients Only)       Balance                                    Cognition Arousal/Alertness: Awake/alert Behavior During Therapy: WFL for tasks assessed/performed Overall Cognitive Status: Within Functional Limits for tasks assessed                      Exercises      General Comments General comments (skin integrity, edema, etc.): pt, significant other  instructed in back care/prec, log roll, lifting restrictions, donning brace, progression of activity.      Pertinent Vitals/Pain Pain Assessment: 0-10 Pain Score: 8  Pain Location: back Pain Descriptors / Indicators: Sore Pain Intervention(s): Limited activity within patient's tolerance;Monitored during session;Premedicated before session    Home Living                      Prior Function            PT Goals (current goals can now be found in the care plan section) Acute Rehab PT Goals Patient Stated Goal: home independent PT Goal Formulation: With patient Time For Goal Achievement: 08/14/16 Potential to Achieve Goals: Good Progress towards PT goals: Progressing toward goals    Frequency   PT Diagnosis  Min 5X/week   Surgery, elective - Plan: DG Lumbar Spine Complete, DG Lumbar Spine Complete  Spinal stenosis, lumbar region, with neurogenic claudication     PT Plan Current plan remains appropriate  Co-evaluation             End of Session Equipment Utilized During Treatment: Back brace Activity Tolerance: Patient tolerated treatment well Patient left: in bed;with call bell/phone within reach;with family/visitor present;with nursing/sitter in room     Time: 7829-5621 PT Time Calculation (min) (ACUTE ONLY): 19 min  Charges:  $Gait Training: 8-22 mins                    G Codes:      [name] 08-17-2016, 12:07 PM

## 2016-08-09 NOTE — Care Management Important Message (Signed)
Important Message  Patient Details  Name: [name] MRN: [id number] Date of Birth: June 24, 1976   Medicare Important Message Given:  Yes    [name] [name] 08/09/2016, 11:47 AM

## 2016-08-09 NOTE — Care Management Note (Signed)
Case Management Note  Patient Details  Name: [name] MRN: [id number] Date of Birth: [birth year]  Subjective/Objective:                    Action/Plan: Updated [facility] phone [phone] , fax [phone], discharge today . Start of care will be tomorrow 08-10-16 . Patient aware and agreeable. Called [facility] to deliver walker to patient's room before discharge. [place] [facility] [place]  Expected Discharge Date:                  Expected Discharge Plan:  Home w Home Health Services  In-House Referral:     Discharge planning Services  CM Consult  Post Acute Care Choice:  Durable Medical Equipment, Home Health Choice offered to:  Patient  DME Arranged:  Walker rolling DME Agency:  [facility]  HH Arranged:  PT HH [facility]  [facility]  Status of Service:  Completed, signed off  If discussed at Long Length of Stay Meetings, dates discussed:    Additional Comments:  Kingsley PlanWile, Anavi Branscum Marie, RN 08/09/2016, 10:58 AM

## 2016-08-09 NOTE — Progress Notes (Signed)
Patient is discharged from room 5M05 at this time. Alert and in stable condition. IV site d/c'd and instructions read to patient with understanding verbalized. Left unit via wheelchair with family and all belongings at side.

## 2016-08-12 MED FILL — Thrombin For Soln 20000 Unit: CUTANEOUS | Qty: 1 | Status: AC

## 2016-08-15 NOTE — Discharge Summary (Signed)
Patient ID: [name] MRN: [id number] DOB/AGE: [birth year] 40 y.o.  Admit date: 08/06/2016 Discharge date: 08/15/2016  Admission Diagnoses:  Principal Problem:   Spinal stenosis, lumbar region, with neurogenic claudication Active Problems:   DDD (degenerative disc disease), lumbar   Lumbar disc herniation with radiculopathy   Neurogenic claudication due to lumbar spinal stenosis   Discharge Diagnoses:  Principal Problem:   Spinal stenosis, lumbar region, with neurogenic claudication Active Problems:   DDD (degenerative disc disease), lumbar   Lumbar disc herniation with radiculopathy   Neurogenic claudication due to lumbar spinal stenosis  status post Procedure(s): Left L3-4 and Right L4-5 transforaminal lumbar interbody fusion with pedicle screws and rods, interbody cages, local bone graft, allograft and Vivigen  Past Medical History:  Diagnosis Date  . ADHD (attention deficit hyperactivity disorder)   . Anxiety   . Arthritis    knee and back  . Chronic back pain   . Depression   . GERD (gastroesophageal reflux disease)   . Hypertension   . Neuropathy (HCC)   . PONV (postoperative nausea and vomiting)     Surgeries: Procedure(s): Left L3-4 and Right L4-5 transforaminal lumbar interbody fusion with pedicle screws and rods, interbody cages, local bone graft, allograft and Vivigen on 08/06/2016   Consultants:   Discharged Condition: Improved  Hospital Course: [name] is an 40 y.o. male who was admitted 08/06/2016 for operative treatment of Spinal stenosis, lumbar region, with neurogenic claudication. Patient failed conservative treatments (please see the history and physical for the specifics) and had severe unremitting pain that affects sleep, daily activities and work/hobbies. After pre-op clearance, the patient was taken to the operating room on 08/06/2016 and underwent  Procedure(s): Left L3-4 and Right L4-5 transforaminal lumbar interbody fusion  with pedicle screws and rods, interbody cages, local bone graft, allograft and Vivigen.    Patient was given perioperative antibiotics:  Anti-infectives    Start     Dose/Rate Route Frequency Ordered Stop   08/06/16 2000  ceFAZolin (ANCEF) IVPB 1 g/50 mL premix     1 g 100 mL/hr over 30 Minutes Intravenous Every 8 hours 08/06/16 1909 08/07/16 0446   08/06/16 0700  ceFAZolin (ANCEF) 3 g in dextrose 5 % 50 mL IVPB     3 g 130 mL/hr over 30 Minutes Intravenous To ShortStay Surgical 08/05/16 1102 08/06/16 1212       Patient was given sequential compression devices and early ambulation to prevent DVT.   Patient benefited maximally from hospital stay and there were no complications. At the time of discharge, the patient was urinating/moving their bowels without difficulty, tolerating a regular diet, pain is controlled with oral pain medications and they have been cleared by PT/OT.   Recent vital signs: No data found.    Recent laboratory studies: No results for input(s): WBC, HGB, HCT, PLT, NA, K, CL, CO2, BUN, CREATININE, GLUCOSE, INR, CALCIUM in the last 72 hours.  Invalid input(s): PT, 2   Discharge Medications:     Medication List    STOP taking these medications   HYDROcodone-acetaminophen 10-325 MG tablet Commonly known as:  NORCO   meloxicam 15 MG tablet Commonly known as:  MOBIC   metoprolol tartrate 25 MG tablet Commonly known as:  LOPRESSOR     TAKE these medications   amphetamine-dextroamphetamine 30 MG 24 hr capsule Commonly known as:  ADDERALL XR Take 30 mg by mouth daily.   aspirin EC 81 MG tablet Take 81 mg by mouth daily.  diazepam 10 MG tablet Commonly known as:  VALIUM Take 10 mg by mouth 2 (two) times daily.   dimenhyDRINATE 50 MG tablet Commonly known as:  DRAMAMINE Take 50 mg by mouth every 8 (eight) hours as needed for nausea.   DULoxetine 60 MG capsule Commonly known as:  CYMBALTA Take 60 mg by mouth daily.   gabapentin 800 MG  tablet Commonly known as:  NEURONTIN Take 800 mg by mouth 2 (two) times daily.   gemfibrozil 600 MG tablet Commonly known as:  LOPID Take 600 mg by mouth 2 (two) times daily.   morphine 15 MG tablet Commonly known as:  MSIR Take 1 tablet (15 mg total) by mouth every 6 (six) hours as needed for moderate pain or severe pain. What changed:  when to take this   oxyCODONE-acetaminophen 5-325 MG tablet Commonly known as:  PERCOCET/ROXICET Take 1-2 tablets by mouth every 4 (four) hours as needed for severe pain. What changed:  how much to take       Diagnostic Studies: Dg Chest 2 View  Result Date: 08/02/2016 CLINICAL DATA:  Preoperative examination prior to lumbar surgery. History of hypertension, current smoker. EXAM: CHEST  2 VIEW COMPARISON:  PA and lateral chest x-ray of February 11, 2015 FINDINGS: The lungs are well-expanded. The interstitial markings are coarse but stable. There is no alveolar infiltrate or pleural effusion. The heart and pulmonary vascularity are normal. There is gentle dextro curvature centered at T9. IMPRESSION: Mild chronic bronchitic-smoking related changes. No acute cardiopulmonary abnormality. Electronically Signed   By: David  [place]azil[place]Jordan Ramirez.D.   On: 08/02/2016 15:20   Dg Lumbar Spine Complete  Result Date: 08/06/2016 CLINICAL DATA:  Portable postop imaging following posterior lumbar spine fusion. EXAM: DG C-ARM 61-120 MIN; LUMBAR SPINE - COMPLETE 4+ VIEW COMPARISON:  MRI, 05/29/2016 FINDINGS: For portable images demonstrate pedicle screws at the L3-L4 L5 levels with interconnecting rods. Orthopedic hardware appears well seated. See a radiolucent disc spacer maintains disc height at the L3-L4 level. Another spacer partly maintains disc height at the L5-S1 level. Disc spacers are well centered. There is no evidence of an operative complication. IMPRESSION: Operative portable imaging following L3 through L5 posterior lumbar spine fusion. Electronically Signed   By: Amie Portlandavid   Ormond Ramirez.D.   On: 08/06/2016 15:00   Dg C-arm 61-120 Min  Result Date: 08/06/2016 CLINICAL DATA:  Portable postop imaging following posterior lumbar spine fusion. EXAM: DG C-ARM 61-120 MIN; LUMBAR SPINE - COMPLETE 4+ VIEW COMPARISON:  MRI, 05/29/2016 FINDINGS: For portable images demonstrate pedicle screws at the L3-L4 L5 levels with interconnecting rods. Orthopedic hardware appears well seated. See a radiolucent disc spacer maintains disc height at the L3-L4 level. Another spacer partly maintains disc height at the L5-S1 level. Disc spacers are well centered. There is no evidence of an operative complication. IMPRESSION: Operative portable imaging following L3 through L5 posterior lumbar spine fusion. Electronically Signed   By: Amie Portlandavid  Ormond Ramirez.D.   On: 08/06/2016 15:00      Follow-up Information    NITKA,Simcha Speir E, MD Follow up in 2 week(s).   Specialty:  Orthopedic Surgery Contact information: [address] [place] [address] [phone]           Discharge Plan:  discharge to home Disposition:     Signed: Naida Ramirez,[name] 08/15/2016, 10:53 AM

## 2016-08-27 ENCOUNTER — Other Ambulatory Visit (INDEPENDENT_AMBULATORY_CARE_PROVIDER_SITE_OTHER): Payer: Self-pay | Admitting: Specialist

## 2016-08-27 DIAGNOSIS — M545 Low back pain: Secondary | ICD-10-CM

## 2016-08-27 DIAGNOSIS — M79605 Pain in left leg: Secondary | ICD-10-CM

## 2016-08-29 ENCOUNTER — Ambulatory Visit
Admission: RE | Admit: 2016-08-29 | Discharge: 2016-08-29 | Disposition: A | Discharge status: Home / Self Care | Payer: Medicare Other | Source: Ambulatory Visit | Attending: Specialist | Admitting: Specialist

## 2016-08-29 DIAGNOSIS — M79605 Pain in left leg: Secondary | ICD-10-CM

## 2016-08-29 DIAGNOSIS — M545 Low back pain: Secondary | ICD-10-CM

## 2016-08-31 ENCOUNTER — Emergency Department (HOSPITAL_COMMUNITY): Payer: Medicare Other

## 2016-08-31 ENCOUNTER — Emergency Department (HOSPITAL_COMMUNITY)
Admission: EM | Admit: 2016-08-31 | Discharge: 2016-09-01 | Discharge status: Against Medical Advice | Payer: Medicare Other | Attending: Emergency Medicine | Admitting: Emergency Medicine

## 2016-08-31 ENCOUNTER — Encounter (HOSPITAL_COMMUNITY): Payer: Self-pay | Admitting: *Deleted

## 2016-08-31 DIAGNOSIS — R791 Abnormal coagulation profile: Secondary | ICD-10-CM | POA: Diagnosis not present

## 2016-08-31 DIAGNOSIS — Z7982 Long term (current) use of aspirin: Secondary | ICD-10-CM | POA: Diagnosis not present

## 2016-08-31 DIAGNOSIS — T814XXA Infection following a procedure, initial encounter: Secondary | ICD-10-CM | POA: Diagnosis not present

## 2016-08-31 DIAGNOSIS — Y69 Unspecified misadventure during surgical and medical care: Secondary | ICD-10-CM | POA: Insufficient documentation

## 2016-08-31 DIAGNOSIS — I10 Essential (primary) hypertension: Secondary | ICD-10-CM | POA: Diagnosis not present

## 2016-08-31 DIAGNOSIS — F17210 Nicotine dependence, cigarettes, uncomplicated: Secondary | ICD-10-CM | POA: Insufficient documentation

## 2016-08-31 DIAGNOSIS — F909 Attention-deficit hyperactivity disorder, unspecified type: Secondary | ICD-10-CM | POA: Insufficient documentation

## 2016-08-31 DIAGNOSIS — B999 Unspecified infectious disease: Secondary | ICD-10-CM

## 2016-08-31 LAB — CBC WITH DIFFERENTIAL/PLATELET
Basophils Absolute: 0 10*3/uL (ref 0.0–0.1)
Basophils Relative: 0 %
EOS PCT: 3 %
Eosinophils Absolute: 0.2 10*3/uL (ref 0.0–0.7)
HCT: 39.7 % (ref 39.0–52.0)
Hemoglobin: 13.6 g/dL (ref 13.0–17.0)
LYMPHS ABS: 3.6 10*3/uL (ref 0.7–4.0)
Lymphocytes Relative: 45 %
MCH: 31.1 pg (ref 26.0–34.0)
MCHC: 34.3 g/dL (ref 30.0–36.0)
MCV: 90.8 fL (ref 78.0–100.0)
MONO ABS: 0.6 10*3/uL (ref 0.1–1.0)
MONOS PCT: 7 %
Neutro Abs: 3.6 10*3/uL (ref 1.7–7.7)
Neutrophils Relative %: 45 %
PLATELETS: 246 10*3/uL (ref 150–400)
RBC: 4.37 MIL/uL (ref 4.22–5.81)
RDW: 12.6 % (ref 11.5–15.5)
WBC: 7.9 10*3/uL (ref 4.0–10.5)

## 2016-08-31 LAB — I-STAT CG4 LACTIC ACID, ED: LACTIC ACID, VENOUS: 0.77 mmol/L (ref 0.5–1.9)

## 2016-08-31 LAB — COMPREHENSIVE METABOLIC PANEL
ALBUMIN: 3.7 g/dL (ref 3.5–5.0)
ALT: 18 U/L (ref 17–63)
AST: 19 U/L (ref 15–41)
Alkaline Phosphatase: 81 U/L (ref 38–126)
Anion gap: 8 (ref 5–15)
BUN: 19 mg/dL (ref 6–20)
CHLORIDE: 102 mmol/L (ref 101–111)
CO2: 28 mmol/L (ref 22–32)
Calcium: 9.2 mg/dL (ref 8.9–10.3)
Creatinine, Ser: 1.01 mg/dL (ref 0.61–1.24)
GFR calc Af Amer: >60 mL/min (ref >60)
GFR calc non Af Amer: >60 mL/min (ref >60)
GLUCOSE: 110 mg/dL — AB (ref 65–99)
POTASSIUM: 4 mmol/L (ref 3.5–5.1)
Sodium: 138 mmol/L (ref 135–145)
Total Bilirubin: 0.2 mg/dL — ABNORMAL LOW (ref 0.3–1.2)
Total Protein: 6.8 g/dL (ref 6.5–8.1)

## 2016-08-31 LAB — C-REACTIVE PROTEIN: CRP: <0.8 mg/dL (ref <1.0)

## 2016-08-31 LAB — SEDIMENTATION RATE: Sed Rate: 10 mm/hr (ref 0–16)

## 2016-08-31 LAB — PROTIME-INR
INR: 0.99
Prothrombin Time: 13.1 seconds (ref 11.4–15.2)

## 2016-08-31 LAB — APTT: aPTT: 36 seconds (ref 24–36)

## 2016-08-31 LAB — LIPASE, BLOOD: Lipase: 22 U/L (ref 11–51)

## 2016-08-31 MED ORDER — FENTANYL CITRATE (PF) 100 MCG/2ML IJ SOLN
50.0000 ug | Freq: Once | INTRAMUSCULAR | Status: AC
  Administered 2016-08-31: 50 ug via INTRAVENOUS
  Filled 2016-08-31: qty 2

## 2016-08-31 MED ORDER — IOPAMIDOL (ISOVUE-300) INJECTION 61%
INTRAVENOUS | Status: AC
  Administered 2016-08-31: 100 mL
  Filled 2016-08-31: qty 100

## 2016-08-31 MED ORDER — ONDANSETRON HCL 4 MG/2ML IJ SOLN
4.0000 mg | Freq: Once | INTRAMUSCULAR | Status: AC
  Administered 2016-08-31: 4 mg via INTRAVENOUS
  Filled 2016-08-31: qty 2

## 2016-08-31 MED ORDER — DIAZEPAM 5 MG PO TABS
5.0000 mg | ORAL_TABLET | Freq: Once | ORAL | Status: AC
  Administered 2016-08-31: 5 mg via ORAL
  Filled 2016-08-31: qty 1

## 2016-08-31 MED ORDER — MORPHINE SULFATE (PF) 4 MG/ML IV SOLN
4.0000 mg | Freq: Once | INTRAVENOUS | Status: AC
  Administered 2016-08-31: 4 mg via INTRAVENOUS
  Filled 2016-08-31: qty 1

## 2016-08-31 MED ORDER — MORPHINE SULFATE (PF) 4 MG/ML IV SOLN
8.0000 mg | Freq: Once | INTRAVENOUS | Status: AC
  Administered 2016-08-31: 8 mg via INTRAVENOUS
  Filled 2016-08-31: qty 2

## 2016-08-31 MED ORDER — SODIUM CHLORIDE 0.9 % IV BOLUS (SEPSIS)
2000.0000 mL | Freq: Once | INTRAVENOUS | Status: AC
  Administered 2016-08-31: 2000 mL via INTRAVENOUS

## 2016-08-31 NOTE — ED Triage Notes (Signed)
Pt had back surgery on 9/12 and reports infection at site and pain to left leg. Has post op appt next week. Denies fever, ambulatory at triage.

## 2016-08-31 NOTE — ED Notes (Signed)
Phlebotomy notified pt back in room from xray

## 2016-08-31 NOTE — ED Provider Notes (Signed)
[facility] DEPT Provider Note  CSN: [id number] Arrival Date & Time: 08/31/16 @ 57  History    Chief Complaint Chief Complaint  Patient presents with  . Post-op Problem    HPI [name] is a 40 y.o. male.  Patient had surgery on the Lumbar Spine in 2016 which was redone this past September by Dr. Louanne Skye.  Has diagnosis of DDD and lumbar disc herniation w/ neurogenic claudication.  Thursday patient had CT scan Thursday that per the patient did not show any acute concerns however patient started having increased pain at Lumbar surgery site and warmth w/ green discharge. Is nauseated and had emesis today. Endorses tactile fevers at home.  Past Medical & Surgical History    Past Medical History:  Diagnosis Date  . ADHD (attention deficit hyperactivity disorder)   . Anxiety   . Arthritis    knee and back  . Chronic back pain   . Depression   . GERD (gastroesophageal reflux disease)   . Hypertension   . Neuropathy ([place])   . PONV (postoperative nausea and vomiting)    Patient Active Problem List   Diagnosis Date Noted  . Spinal stenosis, lumbar region, with neurogenic claudication 08/06/2016    Class: Chronic  . DDD (degenerative disc disease), lumbar 08/06/2016    Class: Chronic  . Lumbar disc herniation with radiculopathy 08/06/2016    Class: Chronic  . Neurogenic claudication due to lumbar spinal stenosis 08/06/2016  . Unspecified vitamin D deficiency 06/30/2014  . Right knee pain 06/30/2014  . Essential hypertension, benign 03/09/2014  . Anxiety and depression 03/09/2014  . Chronic low back pain 03/09/2014  . Neuropathic pain 03/09/2014  . Smoking 03/09/2014  . Hemorrhoid 03/09/2014  . GERD (gastroesophageal reflux disease) 03/09/2014   Past Surgical History:  Procedure Laterality Date  . BACK SURGERY    . CARPAL TUNNEL RELEASE Left   . HAND SURGERY    . KNEE SURGERY Right   . TONSILLECTOMY      Family & Social History    Family History    Problem Relation Age of Onset  . Heart failure Mother   . Heart disease Mother   . Diabetes Father   . Stroke Brother    Social History  Substance Use Topics  . Smoking status: Current Every Day Smoker    Packs/day: 1.00    Years: 18.00    Types: Cigarettes  . Smokeless tobacco: Never Used  . Alcohol use No     Comment: quit 2009    Home Medications    Prior to Admission medications   Medication Sig Start Date End Date Taking? Authorizing Provider  amphetamine-dextroamphetamine (ADDERALL XR) 30 MG 24 hr capsule Take 30 mg by mouth daily. 07/18/16   Historical Provider, MD  aspirin EC 81 MG tablet Take 81 mg by mouth daily.    Historical Provider, MD  diazepam (VALIUM) 10 MG tablet Take 10 mg by mouth 2 (two) times daily. 09/12/15   Historical Provider, MD  dimenhyDRINATE (DRAMAMINE) 50 MG tablet Take 50 mg by mouth every 8 (eight) hours as needed for nausea.    Historical Provider, MD  DULoxetine (CYMBALTA) 60 MG capsule Take 60 mg by mouth daily. 09/12/15   Historical Provider, MD  gabapentin (NEURONTIN) 800 MG tablet Take 800 mg by mouth 2 (two) times daily.     Historical Provider, MD  gemfibrozil (LOPID) 600 MG tablet Take 600 mg by mouth 2 (two) times daily. 05/16/15   Historical Provider, MD  morphine (MSIR) 15 MG tablet Take 1 tablet (15 mg total) by mouth every 6 (six) hours as needed for moderate pain or severe pain. 08/09/16   Lanae Crumbly, PA-C  oxyCODONE-acetaminophen (PERCOCET/ROXICET) 5-325 MG tablet Take 1-2 tablets by mouth every 4 (four) hours as needed for severe pain. 08/09/16   Lanae Crumbly, PA-C    Allergies    Tramadol; Vicodin [hydrocodone-acetaminophen]; and Zofran [ondansetron]  I reviewed & agree with nursing's documentation on the patient's past medical, surgical, social & family histories as well as their allergies.  Review of Systems  Complete ROS obtained, and is negative except as stated in HPI.  Physical Exam  Updated Vital Signs BP 145/75  (BP Location: Left Wrist)   Pulse 102   Temp 97.8 F (36.6 C) (Oral)   Resp 20   SpO2 100%  I have reviewed the triage vital signs and the nursing notes. Physical Exam CONST: Patient alert, well appearing, in no apparent distress.  EYES: PERRLA. EOMI. Conjunctiva w/o d/c. Lids AT w/o swelling.  ENMT: External Nares & Ears AT w/o swelling. Oropharynx patent. MM moist.  NECK: ROM full w/o rigidity. Trachea midline. JVD absent.  CVS: +S1/S2 w/o obvious murmur. Lower extremities w/o pitting edema.  RESP: Respiratory effort unlabored w/o retractions & accessory muscle use. BS clear bilaterally.  GI: Soft & ND. +BS x 4. TTP absent. Hernia absent. Guarding & Rebound absent.  BACK: CVA TTP absent bilaterally.  SKIN: Skin warm & dry. Turgor good. No rash.  PSYCH: Alert. Oriented. Affect and mood appropriate.  NEURO: CN II-XII grossly intact. Motor exam symmetric w/ upper & lower extremities 5/5 bilaterally. Sensation grossly intact.  MSK: Joints located & stable, w/o obvious dislocation & obvious deformity or crepitus absent w/ Cap refill < 2 sec. Peripheral pulses 2+ & equal in all extremities. Surgical site w/ skin open at Lumbar Spine, no active drainage. Mild TTP. No warmth or erythema or induration at site.   ED Treatments & Results   Labs (only abnormal results are displayed) Labs Reviewed - No data to display  EKG    EKG Interpretation  Date/Time:    Ventricular Rate:    PR Interval:    QRS Duration:   QT Interval:    QTC Calculation:   R Axis:     Text Interpretation:         Radiology No results found.  Pertinent labs & imaging results that were available during my care of the patient were independently visualized by me and considered in my medical decision making, please see chart for details.  Procedures (including critical care time) Procedures  Medications Ordered in ED Medications - No data to display  Initial Impression & Plan / ED Course & Results / Final  Disposition   Initial Impression & Plan Patient presents for concern of infection per Lumbar Surgery Site. Wound appears w/o infection per exam however will require blood cultures and other infectious screening labs. Had CT Lumbar spine on 10/5 that showed no concerns.   Patient not altered. Does not appear toxic or in distress. I obtained Lactic Acid, which was within normal limits. Patient afebrile. I have considered and estimate there is low risk for Sepsis or other serious bacterial infection. Therefore will reassess antibiotic need after lab evaluation.  ED Course & Results Due to possibility of post-surgical complication/infection I obtained Xray Lumbar Spine and upon my independent visualization I appreciate no obvious fracture. I therefore consulted the Orthopedic Service for assistance in  the patient's evaluation & care. We discussed the patient's H&P & ED course along w/ pertinent Xray results. Specifically I requested advice regarding imaging to further evaluate. After thorough discussion of the patient's ED course, they request CT of Lumbar and Thoracic Spines and for admission if abnormality.   Per review of the patient's d/c summary on 08/09/16 which involved Left L3-4 and Right L4-5 transforaminal lumbar interbody fusion with pedicle screws and rods, interbody cages, local bone graft, allograft and Vivigen on 08/06/2016 by Dr. Louanne Skye.  Per my review of the patient's CT scan of the Lumbar Spine on 08/29/16: 1. No unexpected or suspicious osseous changes status post L3-L4 and L4-L5 decompression and fusion. Some developing interbody ossification at L3-L4, with less at L4-L5. 2. No evidence of hardware loosening. 3. Postoperative changes to the posterior paraspinal soft tissues with subcutaneous edema evident. No discrete fluid collection by noncontrast CT. 4. Otherwise stable CT appearance of the lumbar spine since 2012.  Upon review of labs I appreciate ESR, CRP and CBC and BMP within normal  limits.   Final Disposition Reassessment of the patient reveals no acute distress. Imaging still pending Radiology Interpretation.   Patient notified myself & the ED staff, along w/ the attending physician, that they have decided to leave without further evaluation or treatment of their Lumbar Spine Surgery site.  I therefore immediately went to bedside & attempted to understand the patient's concerns & reasoning for their desire to leave at this time. They state they have to return to home as his ride is unable to wait. We assured the patient this would be resolved with [place] providing alternative transport. He declined.  All resulted Lab & Imaging studies at this time were reviewed with the patient & I took time to explain the potential implications. Discussion was free of medical terminology & was appropriate for the patient's level of medical exposure & education. I explicitly stated to the patient and spouse/SO that I am concerned they may be actively be experiencing a life-threatening surgical or medical emergency now, including but not limited to permanent leg paralyzation or weakness.  We discussed the nature & purpose, risks & benefits, as well as, the alternatives of staying for Radiology to read the imaging. I also explicitly stated that refusal of this could lead to, but was not limited to, death, permanent disability, severe uncontrollable pain or lifelong impairment.  Prior to refusing, I determined the patient has the capacity to make their decision & understood the consequences of that decision, & they were able to state back to me what we discussed. Time was given to the patient to allow the opportunity to ask questions & consider their options & after our discussion. The patient states they have no questions or concerns regarding our discussion.  I also asked the spouse/SO to dissuade the patient from leaving without success.  As the patient continues to refuse the offerred treatment  & evaluation, I asked them to emergently & immediately return to the ED as soon as possible to complete their workup. I stated they would be welcomed back should they return or change their mind at [facility]. They also stated understanding of this.  Prior to their leaving the ED, both myself & then again, the nursing staff took every reasonable opportunity to attempt to treat them to the best of our ability during the time & methods they allowed.  Prior to leaving, I provided the patient with wound education and wound care. They were agreeable to  this. They did not take their discharge paperwork. They did sign the refusal of treatment form. Told to follow up results when they arrive home by Phone. He stated understanding.  After patient left CT imaging results reveal the following:  1. Status post L3-L5 posterior spinal fusion with small fluid  collection in the posterior midline paraspinal soft tissues,  measuring up to 3.8 x 3.6 cm in greatest transverse dimensions. This  may be a postoperative seroma, but an infected collection may have  an identical appearance.  2. Unchanged moderate right L4-5 and left L3-4 neural foraminal  stenosis.  3. No spinal canal stenosis.   Per review of labs believe consistent w/ seroma and not infection. Results made available to Orthopedics.  Final Clinical Impression & ED Diagnoses  No diagnosis found. Patient care discussed with the attending physician, Dr. Ashok Cordia, who oversaw their evaluation & treatment & voiced agreement.  Note: This document was prepared using Dragon voice recognition software and may include unintentional dictation errors.  House Officer: Voncille Lo, MD, Emergency Medicine Resident.   Voncille Lo, MD 09/02/16 0200    Lajean Saver, MD 09/09/16 [phone]
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
## 2016-08-31 NOTE — ED Notes (Signed)
Per RN and edp cnc istat lactic acid
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
## 2016-09-01 NOTE — ED Notes (Signed)
Pt states his ride is here and he can not wait any longer. Dr. Sibyl Parrhapman made aware and states to have the patient sign AMA. Risk with leaving AMA discussed with pt by Dr. Sibyl Parrhapman previously and again at this time by myself. Pt verbalized understanding and signed AMA form. Pt leaving ambulatory with significant other at this time

## 2016-09-01 NOTE — ED Notes (Signed)
PT back in room. Pt requesting food so bag lunch provided. Okay per ED MD. Pt requesting to have prescriptions for valium and adderall refilled until he can follow up. I informed pt I did not know if that was possible but will notify MD.

## 2016-09-04 ENCOUNTER — Ambulatory Visit (INDEPENDENT_AMBULATORY_CARE_PROVIDER_SITE_OTHER): Payer: Medicare Other | Admitting: Orthopedic Surgery

## 2016-09-04 DIAGNOSIS — M48062 Spinal stenosis, lumbar region with neurogenic claudication: Secondary | ICD-10-CM

## 2016-09-05 ENCOUNTER — Ambulatory Visit (INDEPENDENT_AMBULATORY_CARE_PROVIDER_SITE_OTHER): Payer: Medicare Other | Admitting: Specialist

## 2016-09-05 DIAGNOSIS — M48061 Spinal stenosis, lumbar region without neurogenic claudication: Secondary | ICD-10-CM

## 2016-09-05 LAB — CULTURE, BLOOD (ROUTINE X 2)
CULTURE: NO GROWTH
Culture: NO GROWTH

## 2016-09-13 ENCOUNTER — Ambulatory Visit (INDEPENDENT_AMBULATORY_CARE_PROVIDER_SITE_OTHER): Payer: Medicare Other | Admitting: Specialist

## 2016-09-13 DIAGNOSIS — M48062 Spinal stenosis, lumbar region with neurogenic claudication: Secondary | ICD-10-CM

## 2016-09-17 ENCOUNTER — Telehealth (INDEPENDENT_AMBULATORY_CARE_PROVIDER_SITE_OTHER): Payer: Self-pay
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
## 2016-09-17 NOTE — Telephone Encounter (Signed)
See note below-FYI 

## 2016-09-18 ENCOUNTER — Other Ambulatory Visit (INDEPENDENT_AMBULATORY_CARE_PROVIDER_SITE_OTHER): Payer: Self-pay | Admitting: Radiology

## 2016-09-18 ENCOUNTER — Encounter (INDEPENDENT_AMBULATORY_CARE_PROVIDER_SITE_OTHER): Payer: Self-pay | Admitting: Radiology

## 2016-09-18 MED ORDER — OXYCODONE HCL 15 MG PO TABS: 15.0000 mg | ORAL_TABLET | ORAL | 0 refills | Status: DC | PRN

## 2016-09-18 MED ORDER — OXYCODONE HCL 5 MG PO CAPS: 10.0000 mg | ORAL_CAPSULE | Freq: Four times a day (QID) | ORAL | 0 refills | Status: DC | PRN

## 2016-09-18 NOTE — Telephone Encounter (Signed)
Patient is requesting refill of his narcotic medication Oxycodone 15 mg q 4-6 hours. He is now 6 weeks post op from his surgery, most patients are stopping medications at this time. He needs to cut back on the narcotics and I am prescribing oxycodone IR 5mg  and he can take 1-2 every 6 hours for pain #60.

## 2016-09-18 NOTE — Telephone Encounter (Signed)
Rx printed for Oxycodone IR 5 mg #60 2 po every 6 hours prn pain.jen
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
## 2016-09-18 NOTE — Telephone Encounter (Signed)
Patient aware will be seen at his regular appt this Friday. Patient is requesting new refill for oxycodone. Please advise.

## 2016-09-18 NOTE — Telephone Encounter (Signed)
Patient was advised of Dr. Rogelia MireNitkas message. We will see him in office on Friday to follow up on his incision Thanks Herbert SetaHeather

## 2016-09-19 ENCOUNTER — Other Ambulatory Visit (INDEPENDENT_AMBULATORY_CARE_PROVIDER_SITE_OTHER): Payer: Self-pay | Admitting: Specialist

## 2016-09-19 DIAGNOSIS — M5442 Lumbago with sciatica, left side: Principal | ICD-10-CM

## 2016-09-19 DIAGNOSIS — G8929 Other chronic pain: Secondary | ICD-10-CM

## 2016-09-19 MED ORDER — OXYCODONE HCL 10 MG PO TABS: 10.0000 mg | ORAL_TABLET | Freq: Three times a day (TID) | ORAL | 0 refills | Status: DC | PRN

## 2016-09-19 NOTE — Telephone Encounter (Signed)
Called patient advised rx ready to be picked up Thanks.

## 2016-09-19 NOTE — Progress Notes (Signed)
Noted,  Thanks. Ladon Vandenberghe 

## 2016-09-20 ENCOUNTER — Ambulatory Visit (INDEPENDENT_AMBULATORY_CARE_PROVIDER_SITE_OTHER): Payer: Medicare Other | Admitting: Specialist

## 2016-09-20 ENCOUNTER — Ambulatory Visit (INDEPENDENT_AMBULATORY_CARE_PROVIDER_SITE_OTHER): Payer: Medicare Other

## 2016-09-20 ENCOUNTER — Encounter (INDEPENDENT_AMBULATORY_CARE_PROVIDER_SITE_OTHER): Payer: Self-pay | Admitting: Specialist

## 2016-09-20 VITALS — BP 143/84 | HR 69 | Ht 71.0 in | Wt 373.0 lb

## 2016-09-20 DIAGNOSIS — G8929 Other chronic pain: Secondary | ICD-10-CM

## 2016-09-20 DIAGNOSIS — M5442 Lumbago with sciatica, left side: Secondary | ICD-10-CM

## 2016-09-20 DIAGNOSIS — M25561 Pain in right knee: Secondary | ICD-10-CM | POA: Diagnosis not present

## 2016-09-20 NOTE — Patient Instructions (Addendum)
Take oxycodone for discomfort, use the least amount of narcotics necessary as these are extremely addictive medications and may cause dependency. Avoid bending, stooping and lifting weight greater than 10lbs. No riding greater than 10 -15 minutes in car of lawnmower. Go to have EMG/NCV and this is being done to look for an active radiculopathy. See your primary care physician to see if he would want to continue your previous medication, cymbalta and valium.
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
## 2016-09-30 NOTE — Telephone Encounter (Signed)
Pt. Called for refill of pain meds. Pt. Not sure what he was prescribed last. Pt. Has nerve test 11/30 but cannot sit and wanted to move this up?

## 2016-09-30 NOTE — Telephone Encounter (Signed)
I tried to reach pt but he was not home advised I will try and call again tomorrow.

## 2016-09-30 NOTE — Telephone Encounter (Signed)
Ok to refill 

## 2016-09-30 NOTE — Telephone Encounter (Signed)
Will you please call for me Thanks.

## 2016-10-01 NOTE — Telephone Encounter (Signed)
I called and lm on vm to advise of message below. To call the office with questions.

## 2016-10-01 NOTE — Telephone Encounter (Signed)
Patient returned your call.

## 2016-10-14 ENCOUNTER — Telehealth (INDEPENDENT_AMBULATORY_CARE_PROVIDER_SITE_OTHER): Payer: Self-pay | Admitting: Specialist

## 2016-10-14 NOTE — Telephone Encounter (Signed)
Patient calling for pain medicine because he would like to go out of town for [place] to [place], but would be unable to without something for pain.  He states his NCT is schedule for Nov 30th.  Please prescribe or advise.  His pain specialist appt is scheduled for Jan 31st, 2018.

## 2016-10-14 NOTE — Telephone Encounter (Signed)
Please advise on refill.

## 2016-10-14 NOTE — Telephone Encounter (Signed)
No further narcotics. It has been over 2 months since his surgery; Time to discontinue the narcotics. jen

## 2016-10-16 NOTE — Telephone Encounter (Signed)
Called patient talked with patients mother, advised her to let him know to return my call.  Thanks.

## 2016-10-21 ENCOUNTER — Telehealth (INDEPENDENT_AMBULATORY_CARE_PROVIDER_SITE_OTHER): Payer: Self-pay | Admitting: Specialist

## 2016-10-21 NOTE — Telephone Encounter (Signed)
Mr. [name] returned Heather's call regarding a Rx refill. Please return his phone call.  Pt's ph# [phone] Thank you.

## 2016-10-21 NOTE — Telephone Encounter (Signed)
Called and left vm advising no further narcotics since he is 2 months post op per Dr. Otelia SergeantNitka Thanks.

## 2016-10-24 ENCOUNTER — Encounter: Payer: Medicare Other | Admitting: Diagnostic Neuroimaging

## 2016-10-28 ENCOUNTER — Encounter: Payer: Self-pay | Admitting: Diagnostic Neuroimaging

## 2016-11-01 ENCOUNTER — Ambulatory Visit (INDEPENDENT_AMBULATORY_CARE_PROVIDER_SITE_OTHER): Payer: Medicare Other

## 2016-11-01 ENCOUNTER — Encounter (INDEPENDENT_AMBULATORY_CARE_PROVIDER_SITE_OTHER): Payer: Self-pay | Admitting: Specialist

## 2016-11-01 ENCOUNTER — Ambulatory Visit (INDEPENDENT_AMBULATORY_CARE_PROVIDER_SITE_OTHER): Payer: Medicare Other | Admitting: Specialist

## 2016-11-01 VITALS — BP 156/87 | HR 79 | Ht 71.0 in | Wt 373.0 lb

## 2016-11-01 DIAGNOSIS — G8929 Other chronic pain: Secondary | ICD-10-CM | POA: Diagnosis not present

## 2016-11-01 DIAGNOSIS — M5442 Lumbago with sciatica, left side: Secondary | ICD-10-CM

## 2016-11-01 DIAGNOSIS — Z981 Arthrodesis status: Secondary | ICD-10-CM

## 2016-11-01 MED ORDER — OXYCODONE HCL 10 MG PO TABS: 10.0000 mg | ORAL_TABLET | Freq: Three times a day (TID) | ORAL | 0 refills | Status: DC | PRN

## 2016-11-01 MED ORDER — CYCLOBENZAPRINE HCL 10 MG PO TABS: 10.0000 mg | ORAL_TABLET | Freq: Three times a day (TID) | ORAL | 3 refills | Status: DC | PRN

## 2016-11-01 NOTE — Progress Notes (Signed)
Post-Op Visit Note   Patient: [name]           Date of Birth: February 21, 1976           MRN: [id number] Visit Date: 11/01/2016 PCP: Dorrene GermanEdwin A Avbuere, MD   Assessment & Plan:3 months post L3-4 and L4-5 decompression and fusion for degenerative disc disease and spondylosis changes with foramenal stenosis and congenital stenosis. Complains of persisting pain with left leg numbness in the lateral cutaneous branch of the femoral nerve but also consistent with L3 or L4 radiculopathy that can be residual of the spondylosis and lumbar surgery. Continues to request narcotics for relief of pain. Pain management eval has been requested and he is to see January 2018.   Chief Complaint:  Patient returns right at 3 months post op now from Left L3-4 transforaminal lumbar interbody fusion with pedicle screws and rods, interbody cages, local bone graft. Patient states he don't feel like he is doing any better. He has not had his EMG/NCS done yet due to being sick, but states it is scheduled for January. Pain is still radiating from low back into left leg. Pain Management appt scheduled on Jan 30th. Taking IBU, tylenol, Neurontin.  Visit Diagnoses: No diagnosis found.  Plan: Avoid bending, stooping and avoid lifting weights greater than 10 lbs. Avoid prolong standing and walking. Avoid frequent bending and stooping  No lifting greater than 10 lbs. May use ice or moist heat for pain. Weight loss is of benefit. Handicap license is approved.    Follow-Up Instructions: No Follow-up on file.   Orders:  No orders of the defined types were placed in this encounter.  No orders of the defined types were placed in this encounter.    PMFS History: Patient Active Problem List   Diagnosis Date Noted  . Spinal stenosis, lumbar region, with neurogenic claudication 08/06/2016    Class: Chronic  . DDD (degenerative disc disease), lumbar 08/06/2016    Class: Chronic  . Lumbar disc herniation with  radiculopathy 08/06/2016    Class: Chronic  . Neurogenic claudication due to lumbar spinal stenosis 08/06/2016  . Unspecified vitamin D deficiency 06/30/2014  . Right knee pain 06/30/2014  . Essential hypertension, benign 03/09/2014  . Anxiety and depression 03/09/2014  . Chronic low back pain 03/09/2014  . Neuropathic pain 03/09/2014  . Smoking 03/09/2014  . Hemorrhoid 03/09/2014  . GERD (gastroesophageal reflux disease) 03/09/2014   Past Medical History:  Diagnosis Date  . ADHD (attention deficit hyperactivity disorder)   . Anxiety   . Arthritis    knee and back  . Chronic back pain   . Depression   . GERD (gastroesophageal reflux disease)   . Hypertension   . Neuropathy ([facility])   . PONV (postoperative nausea and vomiting)     Family History  Problem Relation Age of Onset  . Heart failure Mother   . Heart disease Mother   . Diabetes Father   . Stroke Brother     Past Surgical History:  Procedure Laterality Date  . BACK SURGERY    . CARPAL TUNNEL RELEASE Left   . HAND SURGERY    . KNEE SURGERY Right   . TONSILLECTOMY     Social History   Occupational History  . Not on file.   Social History Main Topics  . Smoking status: Current Every Day Smoker    Packs/day: 1.00    Years: 18.00    Types: Cigarettes  . Smokeless tobacco: Never Used  .  Alcohol use No     Comment: quit 2009  . Drug use: No  . Sexual activity: Not on file

## 2016-11-01 NOTE — Patient Instructions (Signed)
Avoid bending, stooping and avoid lifting weights greater than 10 lbs. Avoid prolong standing and walking. Avoid frequent bending and stooping  No lifting greater than 10 lbs. May use ice or moist heat for pain. Weight loss is of benefit. Handicap license is approved.  

## 2016-11-27 DIAGNOSIS — G894 Chronic pain syndrome: Secondary | ICD-10-CM | POA: Diagnosis not present

## 2016-11-27 DIAGNOSIS — R110 Nausea: Secondary | ICD-10-CM | POA: Diagnosis not present

## 2016-11-28 ENCOUNTER — Telehealth (INDEPENDENT_AMBULATORY_CARE_PROVIDER_SITE_OTHER): Payer: Self-pay | Admitting: Specialist

## 2016-11-28 NOTE — Telephone Encounter (Signed)
Please advise 

## 2016-11-28 NOTE — Telephone Encounter (Signed)
Patient requesting pain medicine for his back.  Please call when ready for pick up.

## 2016-11-29 NOTE — Telephone Encounter (Signed)
Greater than 3 months post fusion, no further narcotics recommended. jen

## 2016-12-02 NOTE — Telephone Encounter (Signed)
I called and advised patient and he was ok with this.  He does have an appointment on 12/23/2016

## 2016-12-19 ENCOUNTER — Encounter (INDEPENDENT_AMBULATORY_CARE_PROVIDER_SITE_OTHER): Payer: Self-pay | Admitting: Diagnostic Neuroimaging

## 2016-12-19 ENCOUNTER — Ambulatory Visit (INDEPENDENT_AMBULATORY_CARE_PROVIDER_SITE_OTHER): Payer: Medicare Other | Admitting: Diagnostic Neuroimaging

## 2016-12-19 DIAGNOSIS — M79605 Pain in left leg: Secondary | ICD-10-CM | POA: Diagnosis not present

## 2016-12-19 DIAGNOSIS — Z0289 Encounter for other administrative examinations: Secondary | ICD-10-CM

## 2016-12-19 NOTE — Procedures (Signed)
   [facility]  NCS (NERVE CONDUCTION STUDY) WITH EMG (ELECTROMYOGRAPHY) REPORT   STUDY DATE: 12/19/16 PATIENT NAME: Dante GangCharles E Lamphier DOB: [birth year] MRN: [id number]  ORDERING CLINICIAN: Selina CooleyJ Nitka, MD  TECHNOLOGIST: Gearldine ShownLorraine Jones  ELECTROMYOGRAPHER: Glenford BayleyVikram R. Neelah Mannings, MD  CLINICAL INFORMATION: 41 year old male with left leg pain. Patient reports onset of left thigh pain and burning after lumbar spine surgery in September 2017.  FINDINGS: NERVE CONDUCTION STUDY: Left peroneal motor response is prolonged distal latency, decreased amplitude, normal conduction velocity and normal F-wave latency.  Left tibial motor response and F-wave latency are normal.  Left sural and superficial peroneal sensory responses are normal.   NEEDLE ELECTROMYOGRAPHY: Needle examination was limited due to patient habitus, decreased mobility and pain symptoms. Left lower extremity vastus medialis, tibialis anterior and gastrocnemius muscles were evaluated: - left tibialis anterior: 2+ positive sharp waves and fibrillation potentials at rest and decreased recruitment of large motor units on exertion. - Left vastus medialis and gastric in his muscles are normal.  Lumbar paraspinal muscles were deferred as patient had lumbar spine surgery in September 2017.   IMPRESSION:  Abnormal study demonstrating: - Left peroneal motor response and left tibialis anterior needle EMG abnormalities raise possibility of left L5 radiculopathy. There are active and chronic denervation changes in the left tibialis anterior muscle.      INTERPRETING PHYSICIAN:  Suanne MarkerVIKRAM R. Mylez Venable, MD Certified in Neurology, Neurophysiology and Neuroimaging  [facility] [address], [address] [place], [place] [address] [phone]

## 2016-12-20 ENCOUNTER — Ambulatory Visit (INDEPENDENT_AMBULATORY_CARE_PROVIDER_SITE_OTHER): Payer: Medicare Other | Admitting: Specialist

## 2016-12-23 ENCOUNTER — Ambulatory Visit (INDEPENDENT_AMBULATORY_CARE_PROVIDER_SITE_OTHER): Payer: Medicare Other | Admitting: Specialist

## 2016-12-23 ENCOUNTER — Encounter (INDEPENDENT_AMBULATORY_CARE_PROVIDER_SITE_OTHER): Payer: Self-pay | Admitting: Specialist

## 2016-12-23 VITALS — BP 132/90 | HR 82 | Ht 71.0 in | Wt >= 6400 oz

## 2016-12-23 DIAGNOSIS — Z981 Arthrodesis status: Secondary | ICD-10-CM

## 2016-12-23 DIAGNOSIS — G8929 Other chronic pain: Secondary | ICD-10-CM

## 2016-12-23 DIAGNOSIS — M79605 Pain in left leg: Principal | ICD-10-CM

## 2016-12-23 DIAGNOSIS — M5442 Lumbago with sciatica, left side: Secondary | ICD-10-CM

## 2016-12-23 DIAGNOSIS — M545 Low back pain, unspecified: Secondary | ICD-10-CM

## 2016-12-23 MED ORDER — OXYCODONE HCL 10 MG PO TABS: 10.0000 mg | ORAL_TABLET | Freq: Three times a day (TID) | ORAL | 0 refills | Status: DC | PRN

## 2016-12-23 NOTE — Patient Instructions (Signed)
Avoid frequent bending and stooping  No lifting greater than 10 lbs. May use ice or moist heat for pain. Weight loss is of benefit. Handicap license is approved. Will schedule for lumbar myelogram and post myelogram CT Scan.

## 2016-12-23 NOTE — Progress Notes (Signed)
Office Visit Note   Patient: [name]           Date of Birth: [birth year]           MRN: [id number] Visit Date: 12/23/2016              Requested by: Fleet ContrasEdwin Avbuere, MD [address] [place], [place] [id number] PCP: Gilda Creaseichard M Pavelock, MD   Assessment & Plan: Visit Diagnoses:  1. Lumbar pain with radiation down left leg   2. Status post lumbar spinal fusion     Plan: Avoid frequent bending and stooping  No lifting greater than 10 lbs. May use ice or moist heat for pain. Weight loss is of benefit. Handicap license is approved. Will schedule for lumbar myelogram and post myelogram CT Scan.   Follow-Up Instructions: Return in about 2 weeks (around 01/06/2017).   Orders:  No orders of the defined types were placed in this encounter.  No orders of the defined types were placed in this encounter.     Procedures: No procedures performed   Clinical Data: No additional findings.   Subjective: Chief Complaint  Patient presents with  . Lower Back - Follow-up    [name] is here to follow up [facility] [name] states that [name] is having increased pain in his left lower back, gotten stiffer and and is having numbness in the leg.    Review of Systems   Objective: Vital Signs: BP 132/90 (BP Location: Left Arm, Patient Position: Sitting)   Pulse 82   Ht 5\' 11"  (1.803 m)   Wt (!) 400 lb (181.4 kg)   BMI 55.79 kg/m   Physical Exam  Constitutional: [name] is oriented to person, place, and time. [name] appears well-developed and well-nourished.  HENT:  Head: Normocephalic and atraumatic.  Eyes: EOM are normal. Pupils are equal, round, and reactive to light.  Neck: Normal range of motion. Neck supple.  Pulmonary/Chest: Effort normal and breath sounds normal.  Abdominal: Soft. Bowel sounds are normal.  Neurological: [name] is alert and oriented to person, place, and time.  Skin: Skin is warm and dry.  Psychiatric: [name] has a normal mood  and affect. His behavior is normal. Judgment and thought content normal.    Back Exam   Tenderness  The patient is experiencing tenderness in the lumbar.  Range of Motion  Extension: abnormal  Flexion: abnormal  Lateral Bend Right: abnormal  Lateral Bend Left: abnormal  Rotation Right: abnormal  Rotation Left: abnormal   Muscle Strength  Right Quadriceps:  5/5  Left Quadriceps:  5/5  Right Hamstrings:  5/5  Left Hamstrings:  5/5   Reflexes  Patellar: Hyporeflexic Achilles: Hyporeflexic Babinski's sign: normal   Other  Toe Walk: abnormal Heel Walk: abnormal Sensation: decreased Gait: abnormal  Erythema: no back redness Scars: absent      Specialty Comments:  No specialty comments available.  Imaging: No results found.   PMFS History: Patient Active Problem List   Diagnosis Date Noted  . Spinal stenosis, lumbar region, with neurogenic claudication 08/06/2016    Priority: High    Class: Chronic  . DDD (degenerative disc disease), lumbar 08/06/2016    Priority: High    Class: Chronic  . Lumbar disc herniation with radiculopathy 08/06/2016    Priority: High    Class: Chronic  . Neurogenic claudication due to lumbar spinal stenosis 08/06/2016  . Unspecified vitamin D deficiency 06/30/2014  . Right knee pain 06/30/2014  .  Essential hypertension, benign 03/09/2014  . Anxiety and depression 03/09/2014  . Chronic low back pain 03/09/2014  . Neuropathic pain 03/09/2014  . Smoking 03/09/2014  . Hemorrhoid 03/09/2014  . GERD (gastroesophageal reflux disease) 03/09/2014   Past Medical History:  Diagnosis Date  . ADHD (attention deficit hyperactivity disorder)   . Anxiety   . Arthritis    knee and back  . Chronic back pain   . Depression   . GERD (gastroesophageal reflux disease)   . Hypertension   . Neuropathy (HCC)   . PONV (postoperative nausea and vomiting)     Family History  Problem Relation Age of Onset  . Heart failure Mother   . Heart  disease Mother   . Diabetes Father   . Stroke Brother     Past Surgical History:  Procedure Laterality Date  . BACK SURGERY    . CARPAL TUNNEL RELEASE Left   . HAND SURGERY    . KNEE SURGERY Right   . TONSILLECTOMY     Social History   Occupational History  . Not on file.   Social History Main Topics  . Smoking status: Current Every Day Smoker    Packs/day: 1.00    Years: 18.00    Types: Cigarettes  . Smokeless tobacco: Never Used  . Alcohol use No     Comment: quit 2009  . Drug use: No  . Sexual activity: Not on file

## 2016-12-25 DIAGNOSIS — M79609 Pain in unspecified limb: Secondary | ICD-10-CM | POA: Diagnosis not present

## 2016-12-25 DIAGNOSIS — G894 Chronic pain syndrome: Secondary | ICD-10-CM | POA: Diagnosis not present

## 2016-12-25 DIAGNOSIS — M545 Low back pain: Secondary | ICD-10-CM | POA: Diagnosis not present

## 2016-12-25 DIAGNOSIS — G8911 Acute pain due to trauma: Secondary | ICD-10-CM | POA: Diagnosis not present

## 2016-12-29 ENCOUNTER — Other Ambulatory Visit: Payer: Medicare Other

## 2016-12-29 ENCOUNTER — Inpatient Hospital Stay: Admission: RE | Admit: 2016-12-29 | Payer: Medicare Other | Source: Ambulatory Visit

## 2017-01-01 DIAGNOSIS — F900 Attention-deficit hyperactivity disorder, predominantly inattentive type: Secondary | ICD-10-CM | POA: Diagnosis not present

## 2017-01-01 DIAGNOSIS — F419 Anxiety disorder, unspecified: Secondary | ICD-10-CM | POA: Diagnosis not present

## 2017-01-08 ENCOUNTER — Other Ambulatory Visit: Payer: Medicare Other

## 2017-01-09 ENCOUNTER — Ambulatory Visit (INDEPENDENT_AMBULATORY_CARE_PROVIDER_SITE_OTHER): Payer: Medicare Other | Admitting: Specialist

## 2017-01-21 DIAGNOSIS — F419 Anxiety disorder, unspecified: Secondary | ICD-10-CM | POA: Diagnosis not present

## 2017-01-21 DIAGNOSIS — F900 Attention-deficit hyperactivity disorder, predominantly inattentive type: Secondary | ICD-10-CM | POA: Diagnosis not present

## 2017-01-23 ENCOUNTER — Telehealth: Payer: Self-pay | Admitting: Specialist

## 2017-01-23 NOTE — Telephone Encounter (Signed)
Patient and his friend, [name], called as a self referral for cardiac care. The schedule that they spoke to intially, [name], explained to them that in order to schedule [name] we would need to receive medical records prior to making an appointment. [name] informed [name] that the patient doesn't have a doctor anymore but his BP is elevated, he has chest and neck pain and his metoprolol isn't working anymore. They were advised to go to the ED for these symptoms otherwise they would need to re-establish with a PCP for records. [name] then proceed to use vulgar language and inappropriate racial slurs telling [name] to "go back to the fields", among other things. [name] then hung up on WallandRachel before [name] could respond.  This situation was brought to my attention immediately. I reached out to [name] as a follow-up.  [name] answered the phone and put me on speaker so that [name] was on the call also. I proceed to verify that they would need to reestablish with a PCP, be seen at an Urgent Care or go to the ED for a preliminary medical appointment before we can schedule them in our practice. [name] said she would ask his PCPs office to send old records.   While on the phone, [name] confirmed that she used inappropriate language out of frustration and it wasn't meant to be directed to that person. [name] apologized to that "young lady". I very clearly told her that she may NEVER speak that way to any member of our department ever again. I told her we do not allow disrespectful behavior such as that and it better not ever happen again.  She apologized again and said she would have the records faxed. I gave her the medical records fax number [phone].

## 2017-01-28 ENCOUNTER — Other Ambulatory Visit: Payer: Medicare Other

## 2017-02-05 DIAGNOSIS — M545 Low back pain: Secondary | ICD-10-CM | POA: Diagnosis not present

## 2017-02-05 DIAGNOSIS — G894 Chronic pain syndrome: Secondary | ICD-10-CM | POA: Diagnosis not present

## 2017-02-10 DIAGNOSIS — F900 Attention-deficit hyperactivity disorder, predominantly inattentive type: Secondary | ICD-10-CM | POA: Diagnosis not present

## 2017-02-10 DIAGNOSIS — F419 Anxiety disorder, unspecified: Secondary | ICD-10-CM | POA: Diagnosis not present

## 2017-02-10 DIAGNOSIS — M549 Dorsalgia, unspecified: Secondary | ICD-10-CM | POA: Diagnosis not present

## 2017-02-18 DIAGNOSIS — D509 Iron deficiency anemia, unspecified: Secondary | ICD-10-CM | POA: Diagnosis not present

## 2017-02-18 DIAGNOSIS — G894 Chronic pain syndrome: Secondary | ICD-10-CM | POA: Diagnosis not present

## 2017-02-26 ENCOUNTER — Ambulatory Visit
Admission: RE | Admit: 2017-02-26 | Discharge: 2017-02-26 | Disposition: A | Discharge status: Home / Self Care | Payer: Medicare Other | Source: Ambulatory Visit | Attending: Specialist | Admitting: Specialist

## 2017-02-26 DIAGNOSIS — M5126 Other intervertebral disc displacement, lumbar region: Secondary | ICD-10-CM | POA: Diagnosis not present

## 2017-02-26 MED ORDER — GADOBENATE DIMEGLUMINE 529 MG/ML IV SOLN
20.0000 mL | Freq: Once | INTRAVENOUS | Status: AC | PRN
  Administered 2017-02-26: 20 mL via INTRAVENOUS

## 2017-03-03 ENCOUNTER — Ambulatory Visit (INDEPENDENT_AMBULATORY_CARE_PROVIDER_SITE_OTHER): Payer: Medicare Other | Admitting: Specialist

## 2017-03-03 ENCOUNTER — Encounter (INDEPENDENT_AMBULATORY_CARE_PROVIDER_SITE_OTHER): Payer: Self-pay | Admitting: Specialist

## 2017-03-03 VITALS — BP 147/90 | HR 70 | Ht 71.0 in | Wt 380.0 lb

## 2017-03-03 DIAGNOSIS — M48062 Spinal stenosis, lumbar region with neurogenic claudication: Secondary | ICD-10-CM | POA: Diagnosis not present

## 2017-03-03 DIAGNOSIS — M5416 Radiculopathy, lumbar region: Secondary | ICD-10-CM

## 2017-03-03 NOTE — Progress Notes (Signed)
Office Visit Note   Patient: [name]           Date of Birth: 16-Feb-1976           MRN: [id number] Visit Date: 03/03/2017              Requested by: Gilda Crease, MD [address] [place], [place] [address] PCP: Gilda Crease, MD   Assessment & Plan: Visit Diagnoses:  1. Subacute left lumbar radiculopathy   2. Spinal stenosis of lumbar region with neurogenic claudication   [name] is nearly 7 months following L3-4 and L4-5 TLIF for severe DDD with retrolisthesis and foramenal entrapment. Left TLIF at L3-4 and right TLIF at L4-5. He has had persistent numbness in the left anterior thigh since surgery and complaints of persistent pain that in the past has Required narcotics. We have since discontinued narcotics. 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 His symptoms suggest an ongoing left anterior thigh radiculopathy in L3 or L4 distribution. MRI reviewed today shows persistent narrowing of the left sided L4 and L5 neuroforamen which suggestion of left L4-5 lateral recess narrowing potentially affecting the left lateral recess and left L5 nerve root. His motor exam is normal. I discussed the findings with [name], he is not keen to the idea of a Further surgery but ESI may help dimished the nerve pain and improve upon his pain pattern. Would recommend at both the left L4 and L5 neuroforamen as these seem to demonstrate the most severe narrowing and may responsive to injection treatment.   Plan:Avoid frequent bending and stooping  No lifting greater than 10 lbs. May use ice or moist heat for pain. Weight loss is of benefit. Handicap license is approved. Avoid bending, stooping and avoid lifting weights greater than 10 lbs. Avoid prolong standing and walking. Avoid frequent bending and stooping  No lifting greater than 10 lbs. May  use ice or moist heat for pain. Weight loss is of benefit.  Dr. [place] Blas secretary/Assistant will call to arrange for epidural steroid injection   Follow-Up Instructions: Return in about 3 weeks (around 03/24/2017).   Orders:  Orders Placed This Encounter  Procedures  . Ambulatory referral to Physical Medicine Rehab   No orders of the defined types were placed in this encounter.     Procedures: No procedures performed   Clinical Data: No additional findings.   Subjective: Chief Complaint  Patient presents with  . Lower Back - Follow-up    MRI Reivew    41 year old male post L3-4 and L4-5 TLIFs in 07/2017 with persistent left sided leg and buttock pain. And numbness into the left Anterior thigh. 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 No bowel or bladder difficulties. Standing and walking, but sometime worsens the pain with walking. When he coughs feels like the left leg will give away.     Review of Systems  Constitutional: Negative.   HENT: Negative.   Eyes: Negative.   Respiratory: Negative.   Cardiovascular: Negative.   Gastrointestinal: Negative.   Endocrine: Negative.   Genitourinary: Negative.   Musculoskeletal: Negative.   Skin: Negative.   Allergic/Immunologic: Negative.   Neurological: Negative.   Hematological: Negative.   Psychiatric/Behavioral: Negative.      Objective: Vital Signs: BP (!) 147/90 (BP Location: Left Arm, Patient Position:  Sitting)   Pulse 70   Ht  (1.803 m)   Wt (!) 380 lb (172.4 kg)   BMI 53.00 kg/m   Physical Exam  Constitutional: He is oriented to person, place, and time. He appears well-developed and well-nourished.  HENT:  Head: Normocephalic and atraumatic.  Eyes: EOM are normal. Pupils are equal, round, and reactive to light.  Neck: Normal range of motion. Neck supple.  Pulmonary/Chest: Effort normal and breath sounds normal.  Abdominal: Soft. Bowel sounds are normal.  Neurological:  He is alert and oriented to person, place, and time.  Skin: Skin is warm and dry.  Psychiatric: He has a normal mood and affect. His behavior is normal. Judgment and thought content normal.    Back Exam   Tenderness  The patient is experiencing tenderness in the lumbar.  Range of Motion  Extension: normal  Flexion: abnormal  Lateral Bend Right: abnormal  Lateral Bend Left: abnormal  Rotation Right: abnormal  Rotation Left: abnormal   Muscle Strength  Right Quadriceps:  5/5  Left Quadriceps:  5/5  Right Hamstrings:  5/5  Left Hamstrings:  5/5   Reflexes  Babinski's sign: normal   Comments:  Pain is in the left L4 or L3 distribution.      Specialty Comments:  No specialty comments available.  Imaging: No results found.   PMFS History: Patient Active Problem List   Diagnosis Date Noted  . Spinal stenosis, lumbar region, with neurogenic claudication 08/06/2016    Priority: High    Class: Chronic  . DDD (degenerative disc disease), lumbar 08/06/2016    Priority: High    Class: Chronic  . Lumbar disc herniation with radiculopathy 08/06/2016    Priority: High    Class: Chronic  . Neurogenic claudication due to lumbar spinal stenosis 08/06/2016  . Unspecified vitamin D deficiency 06/30/2014  . Right knee pain 06/30/2014  . Essential hypertension, benign 03/09/2014  . Anxiety and depression 03/09/2014  . Chronic low back pain 03/09/2014  . Neuropathic pain 03/09/2014  . Smoking 03/09/2014  . Hemorrhoid 03/09/2014  . GERD (gastroesophageal reflux disease) 03/09/2014   Past Medical History:  Diagnosis Date  . ADHD (attention deficit hyperactivity disorder)   . Anxiety   . Arthritis    knee and back  . Chronic back pain   . Depression   . GERD (gastroesophageal reflux disease)   . Hypertension   . Neuropathy (HCC)   . PONV (postoperative nausea and vomiting)     Family History  Problem Relation Age of Onset  . Heart failure Mother   . Heart disease  Mother   . Diabetes Father   . Stroke Brother     Past Surgical History:  Procedure Laterality Date  . BACK SURGERY    . CARPAL TUNNEL RELEASE Left   . HAND SURGERY    . KNEE SURGERY Right   . TONSILLECTOMY     Social History   Occupational History  . Not on file.   Social History Main Topics  . Smoking status: Current Every Day Smoker    Packs/day: 1.00    Years: 18.00    Types: Cigarettes  . Smokeless tobacco: Never Used  . Alcohol use No     Comment: quit 2009  . Drug use: No  . Sexual activity: Not on file

## 2017-03-03 NOTE — Patient Instructions (Signed)
Avoid frequent bending and stooping  No lifting greater than 10 lbs. May use ice or moist heat for pain. Weight loss is of benefit. Handicap license is approved. Avoid bending, stooping and avoid lifting weights greater than 10 lbs. Avoid prolong standing and walking. Avoid frequent bending and stooping  No lifting greater than 10 lbs. May use ice or moist heat for pain. Weight loss is of benefit.  Dr. [place] Blas secretary/Assistant will call to arrange for epidural steroid injection

## 2017-03-05 DIAGNOSIS — G894 Chronic pain syndrome: Secondary | ICD-10-CM | POA: Diagnosis not present

## 2017-03-05 DIAGNOSIS — M545 Low back pain: Secondary | ICD-10-CM | POA: Diagnosis not present

## 2017-03-17 ENCOUNTER — Encounter (INDEPENDENT_AMBULATORY_CARE_PROVIDER_SITE_OTHER): Payer: Self-pay | Admitting: Physical Medicine and Rehabilitation

## 2017-03-17 ENCOUNTER — Ambulatory Visit (INDEPENDENT_AMBULATORY_CARE_PROVIDER_SITE_OTHER): Payer: Medicare Other

## 2017-03-17 ENCOUNTER — Ambulatory Visit (INDEPENDENT_AMBULATORY_CARE_PROVIDER_SITE_OTHER): Payer: Medicare Other | Admitting: Physical Medicine and Rehabilitation

## 2017-03-17 VITALS — BP 126/81 | HR 76

## 2017-03-17 DIAGNOSIS — M5416 Radiculopathy, lumbar region: Secondary | ICD-10-CM | POA: Diagnosis not present

## 2017-03-17 DIAGNOSIS — M961 Postlaminectomy syndrome, not elsewhere classified: Secondary | ICD-10-CM

## 2017-03-17 MED ORDER — LIDOCAINE HCL (PF) 1 % IJ SOLN
0.3300 mL | Freq: Once | INTRAMUSCULAR | Status: AC
  Administered 2017-03-17: 0.3 mL

## 2017-03-17 MED ORDER — METHYLPREDNISOLONE ACETATE 80 MG/ML IJ SUSP
80.0000 mg | Freq: Once | INTRAMUSCULAR | Status: AC
  Administered 2017-03-17: 80 mg

## 2017-03-17 NOTE — Progress Notes (Signed)
[name] - 41 y.o. male MRN [id number]  Date of birth: [birth year]  Office Visit Note: Visit Date: 03/17/2017 PCP: Gilda Crease, MD Referred by: Gilda Crease, MD  Subjective: Chief Complaint  Patient presents with  . Lower Back - Pain   HPI: Mr. [name] is a 41 year old gentleman with prior lumbar fusion from L3 to L5. He is having left hip and leg pain and across the lower back. His pain is constant but worse after sitting for a while and he goes to stand up. He does have pain down the left leg to his foot with cramping and weakness in the leg. Dr. Otelia Sergeant requests a diagnostic and hopefully therapeutic left L4-L5 transforaminal epidural steroid injection. Depending on the relief that may want to consider a L5-S1 facet joint block.    ROS Otherwise per HPI.  Assessment & Plan: Visit Diagnoses:  1. Lumbar radiculopathy   2. Post laminectomy syndrome     Plan: Findings:  Diagnostic and therapeutic left L4-L5 transforaminal epidural steroid injection.    Meds & Orders:  Meds ordered this encounter  Medications  . lidocaine (PF) (XYLOCAINE) 1 % injection 0.3 mL  . methylPREDNISolone acetate (DEPO-MEDROL) injection 80 mg    Orders Placed This Encounter  Procedures  . XR C-ARM NO REPORT  . Epidural Steroid injection    Follow-up: Return for Dr. Otelia Sergeant.   Procedures: No procedures performed  Lumbosacral Transforaminal Epidural Steroid Injection - Infraneural Approach with Fluoroscopic Guidance  Patient: [name]      Date of Birth: May 16, 1976 MRN: [id number] PCP: Gilda Crease, MD      Visit Date: 03/17/2017   Universal Protocol:    Date/Time: 04/23/181:03 PM  Consent Given By: the patient  Position: PRONE   Additional Comments: Vital signs were monitored before and after the procedure. Patient was prepped and draped in the usual sterile fashion. The correct patient, procedure, and site was verified.   Injection Procedure  Details:  Procedure Site One Meds Administered:  Meds ordered this encounter  Medications  . lidocaine (PF) (XYLOCAINE) 1 % injection 0.3 mL  . methylPREDNISolone acetate (DEPO-MEDROL) injection 80 mg      Laterality: Left  Location/Site:  L4-L5 L5-S1  Needle size: 22 G  Needle type: Spinal  Needle Placement: Transforaminal  Findings:  -Contrast Used: 1 mL iohexol 180 mg iodine/mL   -Comments: Excellent flow of contrast along the nerve and into the epidural space.  Procedure Details: After squaring off the end-plates of the desired vertebral level to get a true AP view, the C-arm was obliqued to the painful side so that the superior articulating process is positioned about 1/3 the length of the inferior endplate.  The needle was aimed toward the junction of the superior articular process and the transverse process of the inferior vertebrae. The needle's initial entry is in the lower third of the foramen through Kambin's triangle. The soft tissues overlying this target were infiltrated with 2-3 ml. of 1% Lidocaine without Epinephrine.  The spinal needle was then inserted and advanced toward the target using a "trajectory" view along the fluoroscope beam.  Under AP and lateral visualization, the needle was advanced so it did not puncture dura and did not traverse medially beyond the 6 o'clock position of the pedicle. Bi-planar projections were used to confirm position. Aspiration was confirmed to be negative for CSF and/or blood. A 1-2 ml. volume of Isovue-250 was injected and flow of contrast was noted at each  level. Radiographs were obtained for documentation purposes.   After attaining the desired flow of contrast documented above, a 0.5 to 1.0 ml test dose of 0.25% Marcaine was injected into each respective transforaminal space.  The patient was observed for 90 seconds post injection.  After no sensory deficits were reported, and normal lower extremity motor function was noted,   the  above injectate was administered so that equal amounts of the injectate were placed at each foramen (level) into the transforaminal epidural space.   Additional Comments:  The patient tolerated the procedure well Dressing: Band-Aid    Post-procedure details: Patient was observed during the procedure. Post-procedure instructions were reviewed.  Patient left the clinic in stable condition.   Clinical History: Lumbar spine MRI 02/26/2017 IMPRESSION: 1. No acute osseous abnormality or abnormal enhancement of the spine. 2. L3 through L5 posterior/interbody instrumented fusion and laminectomy. 3. Lumbar degenerative changes are greatest at the L4-5 level where posterior marginal osteophytes and facet hypertrophy result in contact on the descending left L5 nerve root in the lateral recess and mild bilateral foraminal narrowing. 4. No high-grade canal stenosis.  He reports that he has been smoking Cigarettes.  He has a 18.00 pack-year smoking history. He has never used smokeless tobacco. No results for input(s): HGBA1C, LABURIC in the last 8760 hours.  Objective:  VS:  HT:    WT:   BMI:     BP:126/81  HR:76bpm  TEMP: ( )  RESP:98 % Physical Exam  Musculoskeletal:  Very large body habitus. Patient ambulates without aid with an antalgic gait to the left. He has good distal strength.    Ortho Exam Imaging: Xr C-arm No Report  Result Date: 03/17/2017 Please see Notes or Procedures tab for imaging impression.   Past Medical/Family/Surgical/Social History: Medications & Allergies reviewed per EMR Patient Active Problem List   Diagnosis Date Noted  . Spinal stenosis, lumbar region, with neurogenic claudication 08/06/2016    Class: Chronic  . DDD (degenerative disc disease), lumbar 08/06/2016    Class: Chronic  . Lumbar disc herniation with radiculopathy 08/06/2016    Class: Chronic  . Neurogenic claudication due to lumbar spinal stenosis 08/06/2016  . Unspecified vitamin D  deficiency 06/30/2014  . Right knee pain 06/30/2014  . Essential hypertension, benign 03/09/2014  . Anxiety and depression 03/09/2014  . Chronic low back pain 03/09/2014  . Neuropathic pain 03/09/2014  . Smoking 03/09/2014  . Hemorrhoid 03/09/2014  . GERD (gastroesophageal reflux disease) 03/09/2014   Past Medical History:  Diagnosis Date  . ADHD (attention deficit hyperactivity disorder)   . Anxiety   . Arthritis    knee and back  . Chronic back pain   . Depression   . GERD (gastroesophageal reflux disease)   . Hypertension   . Neuropathy   . PONV (postoperative nausea and vomiting)    Family History  Problem Relation Age of Onset  . Heart failure Mother   . Heart disease Mother   . Diabetes Father   . Stroke Brother    Past Surgical History:  Procedure Laterality Date  . BACK SURGERY    . CARPAL TUNNEL RELEASE Left   . HAND SURGERY    . KNEE SURGERY Right   . TONSILLECTOMY     Social History   Occupational History  . Not on file.   Social History Main Topics  . Smoking status: Current Every Day Smoker    Packs/day: 1.00    Years: 18.00    Types: Cigarettes  .  Smokeless tobacco: Never Used  . Alcohol use No     Comment: quit 2009  . Drug use: No  . Sexual activity: Not on file

## 2017-03-17 NOTE — Progress Notes (Deleted)
Pain is across lower back. Worse on left side. Constant. Worse after sitting awhile and goes to stand up. Pain down left leg to foot. Cramps and weakness  in leg

## 2017-03-17 NOTE — Procedures (Signed)
Lumbosacral Transforaminal Epidural Steroid Injection - Infraneural Approach with Fluoroscopic Guidance  Patient: [name]      Date of Birth: 10/10/1976 MRN: [id number] PCP: Gilda Crease, MD      Visit Date: 03/17/2017   Universal Protocol:    Date/Time: 04/23/181:03 PM  Consent Given By: the patient  Position: PRONE   Additional Comments: Vital signs were monitored before and after the procedure. Patient was prepped and draped in the usual sterile fashion. The correct patient, procedure, and site was verified.   Injection Procedure Details:  Procedure Site One Meds Administered:  Meds ordered this encounter  Medications  . lidocaine (PF) (XYLOCAINE) 1 % injection 0.3 mL  . methylPREDNISolone acetate (DEPO-MEDROL) injection 80 mg      Laterality: Left  Location/Site:  L4-L5 L5-S1  Needle size: 22 G  Needle type: Spinal  Needle Placement: Transforaminal  Findings:  -Contrast Used: 1 mL iohexol 180 mg iodine/mL   -Comments: Excellent flow of contrast along the nerve and into the epidural space.  Procedure Details: After squaring off the end-plates of the desired vertebral level to get a true AP view, the C-arm was obliqued to the painful side so that the superior articulating process is positioned about 1/3 the length of the inferior endplate.  The needle was aimed toward the junction of the superior articular process and the transverse process of the inferior vertebrae. The needle's initial entry is in the lower third of the foramen through Kambin's triangle. The soft tissues overlying this target were infiltrated with 2-3 ml. of 1% Lidocaine without Epinephrine.  The spinal needle was then inserted and advanced toward the target using a "trajectory" view along the fluoroscope beam.  Under AP and lateral visualization, the needle was advanced so it did not puncture dura and did not traverse medially beyond the 6 o'clock position of the pedicle. Bi-planar  projections were used to confirm position. Aspiration was confirmed to be negative for CSF and/or blood. A 1-2 ml. volume of Isovue-250 was injected and flow of contrast was noted at each level. Radiographs were obtained for documentation purposes.   After attaining the desired flow of contrast documented above, a 0.5 to 1.0 ml test dose of 0.25% Marcaine was injected into each respective transforaminal space.  The patient was observed for 90 seconds post injection.  After no sensory deficits were reported, and normal lower extremity motor function was noted,   the above injectate was administered so that equal amounts of the injectate were placed at each foramen (level) into the transforaminal epidural space.   Additional Comments:  The patient tolerated the procedure well Dressing: Band-Aid    Post-procedure details: Patient was observed during the procedure. Post-procedure instructions were reviewed.  Patient left the clinic in stable condition.

## 2017-03-17 NOTE — Patient Instructions (Signed)

## 2017-03-18 DIAGNOSIS — F419 Anxiety disorder, unspecified: Secondary | ICD-10-CM | POA: Diagnosis not present

## 2017-03-18 DIAGNOSIS — F900 Attention-deficit hyperactivity disorder, predominantly inattentive type: Secondary | ICD-10-CM | POA: Diagnosis not present

## 2017-03-18 DIAGNOSIS — M549 Dorsalgia, unspecified: Secondary | ICD-10-CM | POA: Diagnosis not present

## 2017-04-04 DIAGNOSIS — G894 Chronic pain syndrome: Secondary | ICD-10-CM | POA: Diagnosis not present

## 2017-04-04 DIAGNOSIS — M545 Low back pain: Secondary | ICD-10-CM | POA: Diagnosis not present

## 2017-04-28 ENCOUNTER — Ambulatory Visit (INDEPENDENT_AMBULATORY_CARE_PROVIDER_SITE_OTHER): Payer: Medicare Other | Admitting: Specialist

## 2017-05-07 ENCOUNTER — Other Ambulatory Visit (INDEPENDENT_AMBULATORY_CARE_PROVIDER_SITE_OTHER): Payer: Self-pay | Admitting: Specialist

## 2017-05-07 DIAGNOSIS — M545 Low back pain, unspecified: Secondary | ICD-10-CM

## 2017-05-07 DIAGNOSIS — Z981 Arthrodesis status: Secondary | ICD-10-CM

## 2017-05-07 DIAGNOSIS — M5442 Lumbago with sciatica, left side: Principal | ICD-10-CM

## 2017-05-07 DIAGNOSIS — M79605 Pain in left leg: Secondary | ICD-10-CM

## 2017-05-07 DIAGNOSIS — G8929 Other chronic pain: Secondary | ICD-10-CM

## 2017-05-07 NOTE — Telephone Encounter (Signed)
Rx refill Oxycodone °

## 2017-05-08 ENCOUNTER — Telehealth (INDEPENDENT_AMBULATORY_CARE_PROVIDER_SITE_OTHER): Payer: Self-pay | Admitting: Specialist

## 2017-05-08 NOTE — Telephone Encounter (Signed)
Patient called to let Dr Otelia SergeantNitka know he is going to the pain clinic 06/02/17 and want to know if he can get his Rx filled until then. Patient said he is in a lot pain. The number to contact patient is [phone]

## 2017-05-08 NOTE — Telephone Encounter (Signed)
Has been receiving controlled substances since last seen from multiple prescribers, I can not prescribe further narcotics. jen

## 2017-05-08 NOTE — Addendum Note (Signed)
Addended by: Penne LashSHUE WILLS, Otis DialsHRISTY N on: 05/08/2017 09:28 AM   Modules accepted: Orders

## 2017-05-09 NOTE — Telephone Encounter (Signed)
Pt is aware rx was denied

## 2017-05-09 NOTE — Telephone Encounter (Signed)
Pt is aware rx was denied 

## 2017-06-23 ENCOUNTER — Ambulatory Visit (INDEPENDENT_AMBULATORY_CARE_PROVIDER_SITE_OTHER): Payer: Medicare Other | Admitting: Specialist

## 2017-07-08 DIAGNOSIS — F909 Attention-deficit hyperactivity disorder, unspecified type: Secondary | ICD-10-CM | POA: Diagnosis not present

## 2017-07-08 DIAGNOSIS — F331 Major depressive disorder, recurrent, moderate: Secondary | ICD-10-CM | POA: Diagnosis not present

## 2017-07-08 DIAGNOSIS — F410 Panic disorder [episodic paroxysmal anxiety] without agoraphobia: Secondary | ICD-10-CM | POA: Diagnosis not present

## 2017-07-10 DIAGNOSIS — I10 Essential (primary) hypertension: Secondary | ICD-10-CM | POA: Diagnosis not present

## 2017-07-10 DIAGNOSIS — Z13220 Encounter for screening for lipoid disorders: Secondary | ICD-10-CM | POA: Diagnosis not present

## 2017-07-10 DIAGNOSIS — G894 Chronic pain syndrome: Secondary | ICD-10-CM | POA: Diagnosis not present

## 2017-07-10 DIAGNOSIS — Z0000 Encounter for general adult medical examination without abnormal findings: Secondary | ICD-10-CM | POA: Diagnosis not present

## 2017-07-10 DIAGNOSIS — M545 Low back pain: Secondary | ICD-10-CM | POA: Diagnosis not present

## 2017-07-10 DIAGNOSIS — Z131 Encounter for screening for diabetes mellitus: Secondary | ICD-10-CM | POA: Diagnosis not present

## 2017-07-11 ENCOUNTER — Other Ambulatory Visit (INDEPENDENT_AMBULATORY_CARE_PROVIDER_SITE_OTHER): Payer: Self-pay | Admitting: Specialist

## 2017-07-11 NOTE — Telephone Encounter (Signed)
Patient called back to confirm correct phone number, which is [phone].

## 2017-07-11 NOTE — Telephone Encounter (Signed)
Patient called needing Rx refilled (Hydrocodone)  Patient also said his hand is also hurting pretty bad. The number to contact patient is [phone].

## 2017-07-14 NOTE — Telephone Encounter (Signed)
Patient called needing Rx refilled (Hydrocodone)  Patient also said his hand is also hurting pretty bad.

## 2017-07-17 DIAGNOSIS — F909 Attention-deficit hyperactivity disorder, unspecified type: Secondary | ICD-10-CM | POA: Diagnosis not present

## 2017-07-17 DIAGNOSIS — F410 Panic disorder [episodic paroxysmal anxiety] without agoraphobia: Secondary | ICD-10-CM | POA: Diagnosis not present

## 2017-07-17 DIAGNOSIS — F331 Major depressive disorder, recurrent, moderate: Secondary | ICD-10-CM | POA: Diagnosis not present

## 2017-08-12 NOTE — Telephone Encounter (Signed)
No further narcotics. jen

## 2017-08-13 DIAGNOSIS — F909 Attention-deficit hyperactivity disorder, unspecified type: Secondary | ICD-10-CM | POA: Diagnosis not present

## 2017-08-13 DIAGNOSIS — F331 Major depressive disorder, recurrent, moderate: Secondary | ICD-10-CM | POA: Diagnosis not present

## 2017-08-13 DIAGNOSIS — F410 Panic disorder [episodic paroxysmal anxiety] without agoraphobia: Secondary | ICD-10-CM | POA: Diagnosis not present

## 2017-08-13 NOTE — Telephone Encounter (Signed)
Patient has follow up on 09/01/17

## 2017-08-26 DIAGNOSIS — F331 Major depressive disorder, recurrent, moderate: Secondary | ICD-10-CM | POA: Diagnosis not present

## 2017-08-26 DIAGNOSIS — F909 Attention-deficit hyperactivity disorder, unspecified type: Secondary | ICD-10-CM | POA: Diagnosis not present

## 2017-08-26 DIAGNOSIS — F410 Panic disorder [episodic paroxysmal anxiety] without agoraphobia: Secondary | ICD-10-CM | POA: Diagnosis not present

## 2017-09-01 ENCOUNTER — Encounter (INDEPENDENT_AMBULATORY_CARE_PROVIDER_SITE_OTHER): Payer: Self-pay | Admitting: Specialist

## 2017-09-01 ENCOUNTER — Ambulatory Visit (INDEPENDENT_AMBULATORY_CARE_PROVIDER_SITE_OTHER): Payer: Medicare Other | Admitting: Specialist

## 2017-09-01 VITALS — BP 152/102 | HR 96 | Ht 71.0 in | Wt 380.0 lb

## 2017-09-01 DIAGNOSIS — R200 Anesthesia of skin: Secondary | ICD-10-CM | POA: Diagnosis not present

## 2017-09-01 DIAGNOSIS — M4320 Fusion of spine, site unspecified: Secondary | ICD-10-CM | POA: Diagnosis not present

## 2017-09-01 DIAGNOSIS — I10 Essential (primary) hypertension: Secondary | ICD-10-CM | POA: Diagnosis not present

## 2017-09-01 DIAGNOSIS — R202 Paresthesia of skin: Secondary | ICD-10-CM

## 2017-09-01 IMAGING — RF DG LUMBAR SPINE COMPLETE 4+V
1 series · 4 of 4 positions shown · non-contrast
Comparison: MRI, 05/29/2016

CLINICAL DATA: Portable postop imaging following posterior lumbar
spine fusion.

EXAM:
DG C-ARM 61-120 MIN; LUMBAR SPINE - COMPLETE 4+ VIEW

[Series 1: run · 4 of 4 slices shown]
[im 1/4]
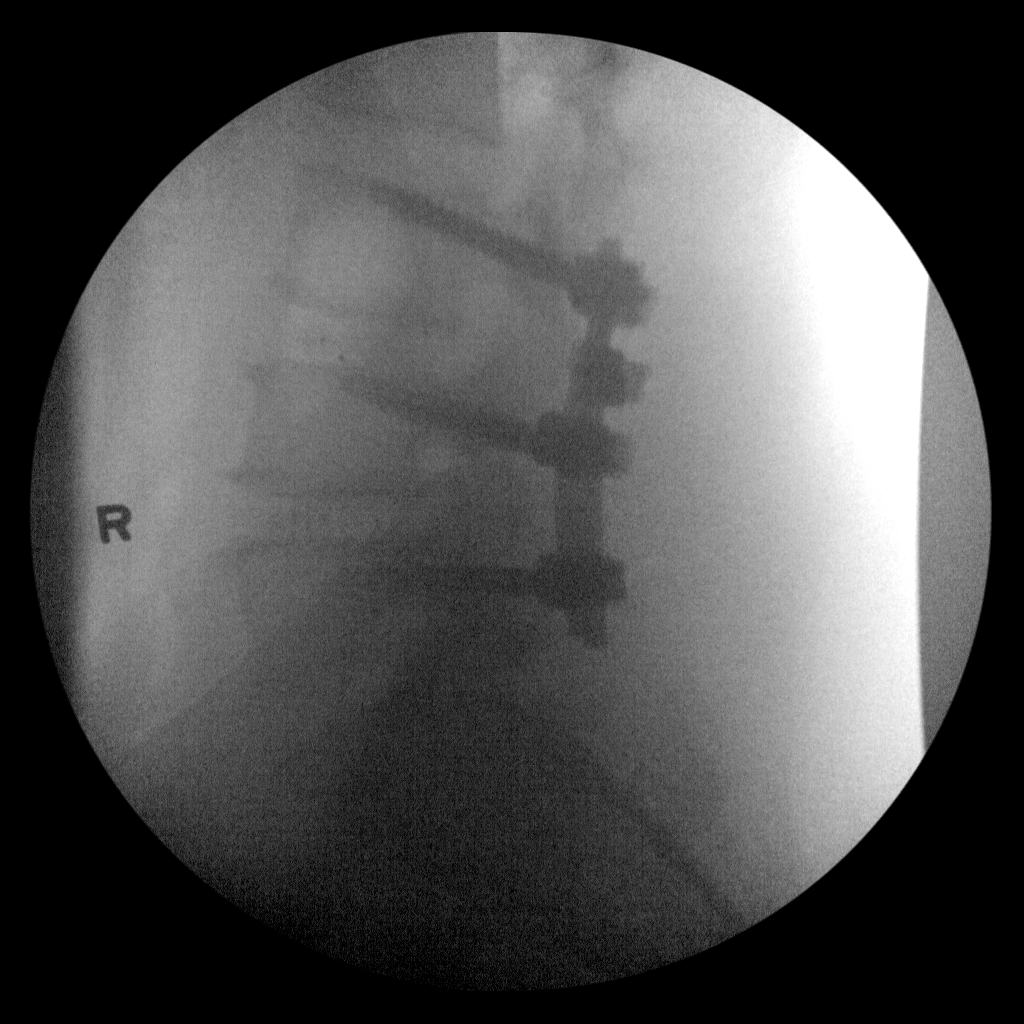
[im 2/4]
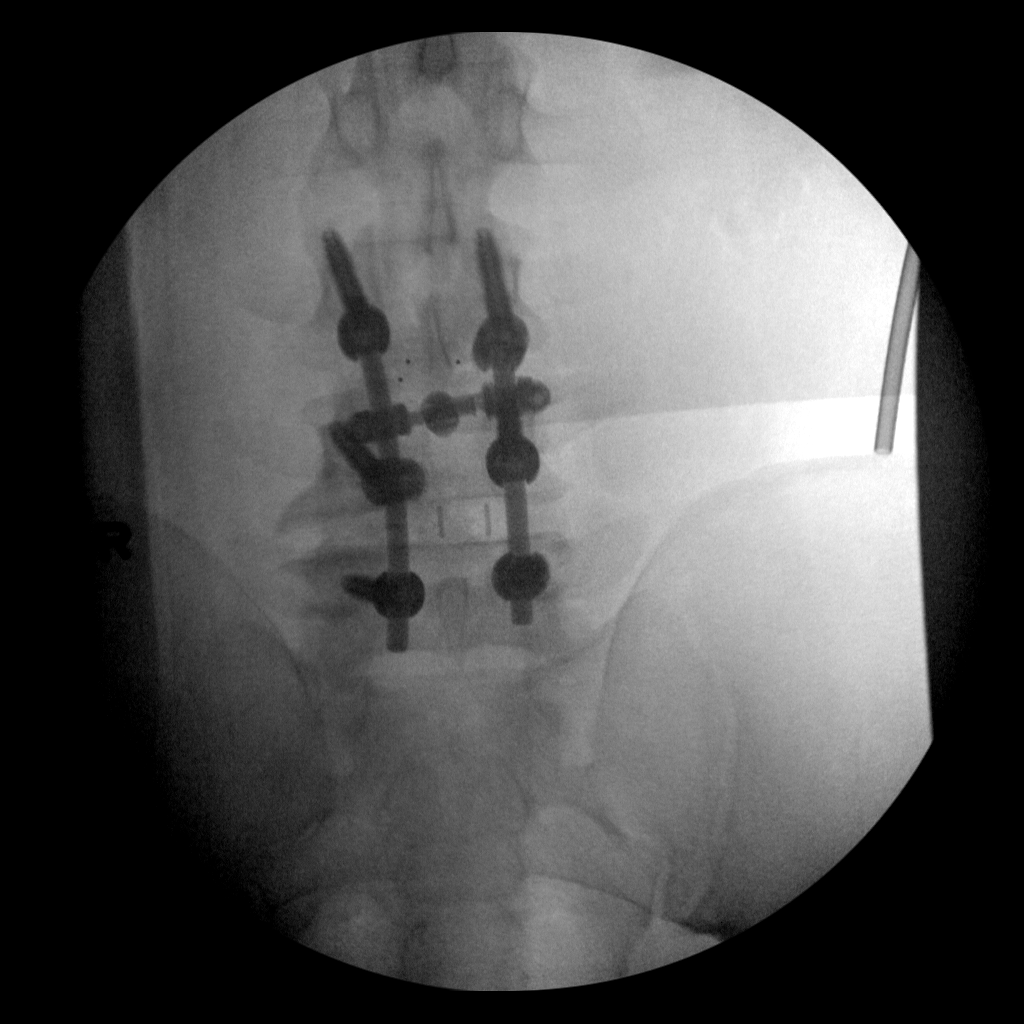
[im 3/4]
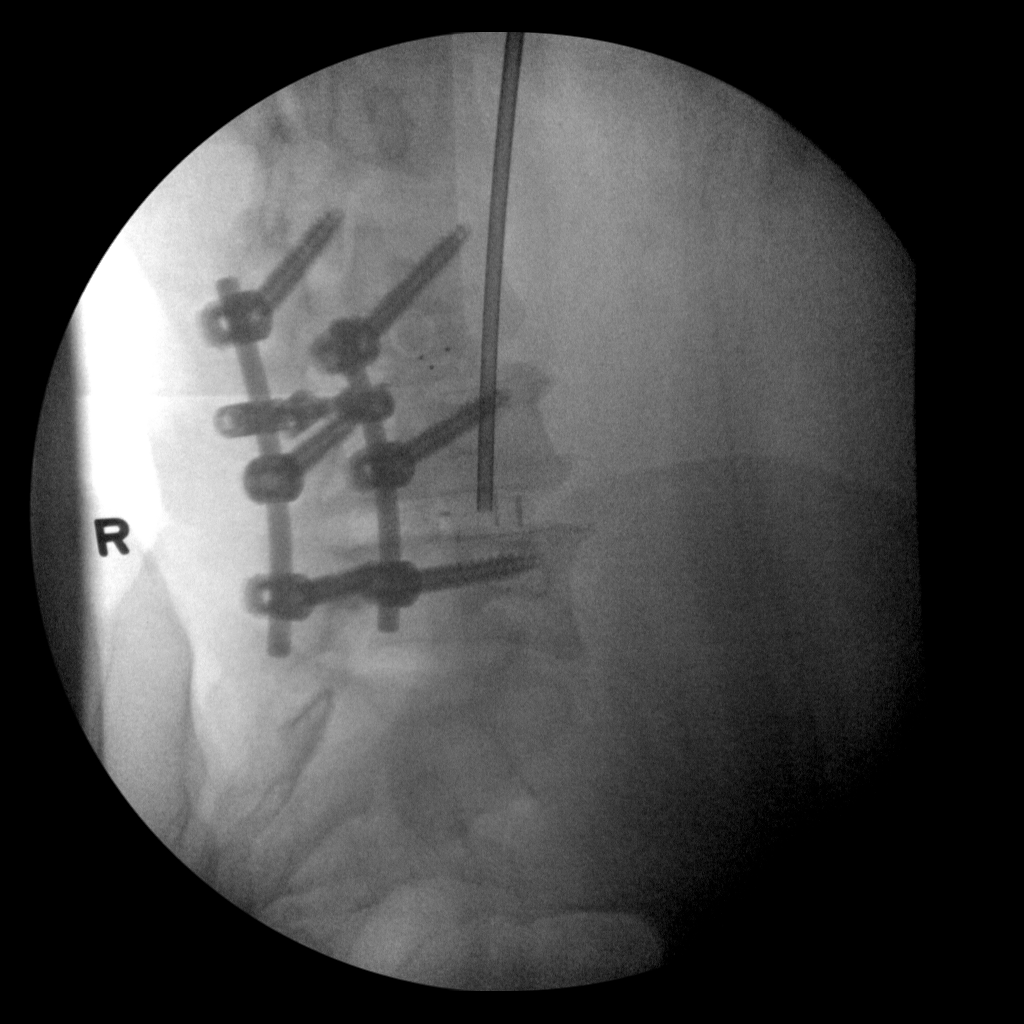
[im 4/4]
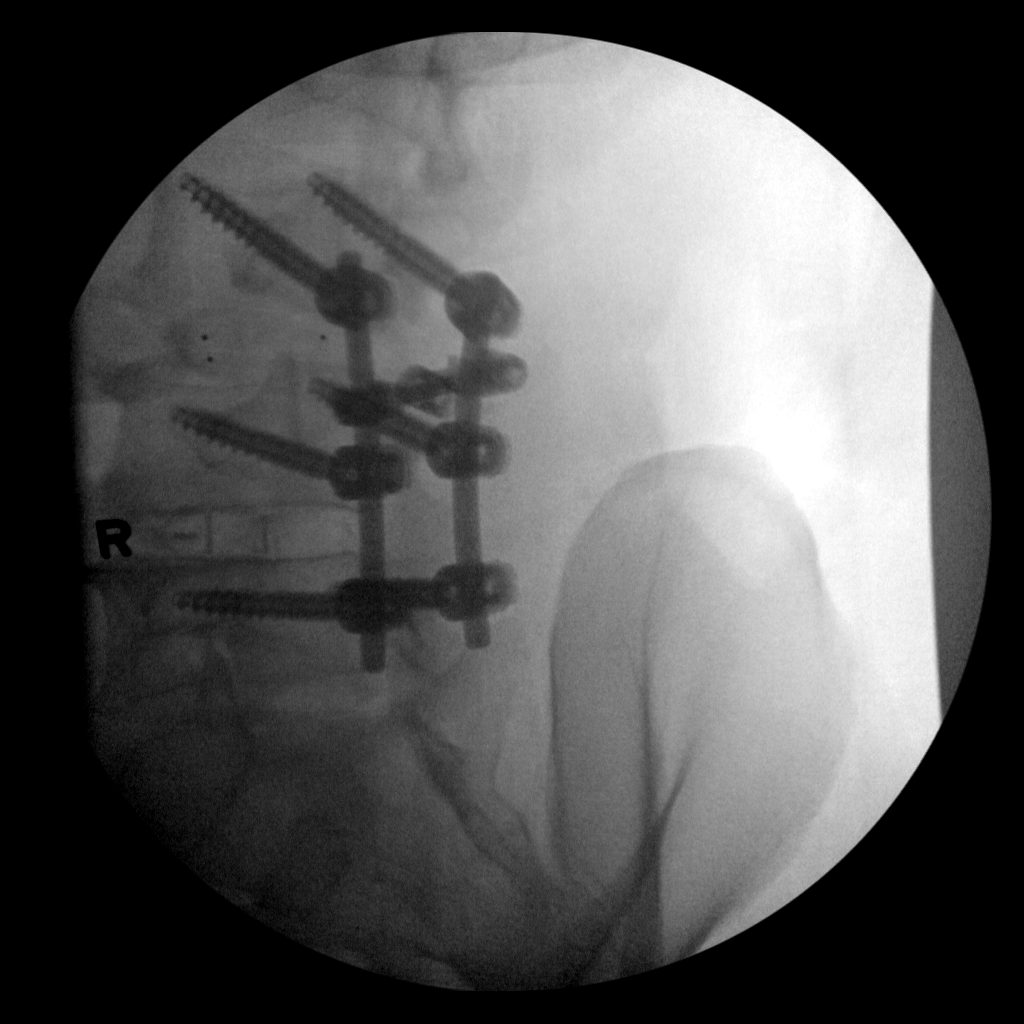

[4 of 4 positions shown; findings below may reference images not displayed]

FINDINGS: For portable images demonstrate pedicle screws at the L3-L4 L5
levels with interconnecting rods. Orthopedic hardware appears well
seated. See a radiolucent disc spacer maintains disc height at the
L3-L4 level. Another spacer partly maintains disc height at the
L5-S1 level. Disc spacers are well centered.

There is no evidence of an operative complication.
IMPRESSION: Operative portable imaging following L3 through L5 posterior lumbar
spine fusion.

## 2017-09-01 MED ORDER — METOPROLOL SUCCINATE ER 25 MG PO TB24: 25.0000 mg | ORAL_TABLET | Freq: Two times a day (BID) | ORAL | 3 refills | Status: DC

## 2017-09-01 MED ORDER — CELECOXIB 200 MG PO CAPS: 200.0000 mg | ORAL_CAPSULE | Freq: Two times a day (BID) | ORAL | 3 refills | Status: DC

## 2017-09-01 MED ORDER — GABAPENTIN 800 MG PO TABS: 800.0000 mg | ORAL_TABLET | Freq: Two times a day (BID) | ORAL | 3 refills | Status: DC

## 2017-09-01 NOTE — Progress Notes (Signed)
Office Visit Note   Patient: [name]           Date of Birth: 1976/02/14           MRN: [id number] Visit Date: 09/01/2017              Requested by: Gilda Crease, MD [address] [place], [place] [address] PCP: Patient, No Pcp Per   Assessment & Plan: Visit Diagnoses:  1. Numbness and tingling of right hand   2. Numbness and tingling of left leg   3. Fusion of spine, unspecified spinal region   4. Essential hypertension, benign     Plan: Avoid frequent bending and stooping  No lifting greater than 10 lbs. May use ice or moist heat for pain. Weight loss is of benefit. Avoid overhead lifting and overhead use of the arms. Do not lift greater than 5-10 lbs. Adjust head rest in vehicle to prevent hyperextension if rear ended. Take extra precautions to avoid falling, including use of a cane if you feel weak. Follow-Up Instructions: No Follow-up on file.   Orders:  No orders of the defined types were placed in this encounter.  No orders of the defined types were placed in this encounter.     Procedures: No procedures performed   Clinical Data: No additional findings.   Subjective: Chief Complaint  Patient presents with  . Lower Back - Follow-up    41 year old male right hand dominant with history of neck pain and numbness into the right arm greater than left hand with dropping of items. He goes to grab something and then the right hand gives away. Was seeing Dr. Carmina Miller on [facility] [address] but he is no longer there and was advised to see doctors at IM at [facility]. He has taken suboxone which made him feel bad. The left leg is painful continuously. Family history is significant for back and hip problems and problems of obesity, one brother weighting at 600 lbs and lost weight with bypass down to 200 lbs. Has been to  Seminars about gastric sleeve and bypass.     Review of Systems  HENT: Negative.   Eyes: Negative.   Respiratory: Negative.     Cardiovascular: Negative.   Gastrointestinal: Negative.   Endocrine: Negative.   Genitourinary: Negative.   Musculoskeletal: Negative.   Skin: Negative.   Allergic/Immunologic: Negative.   Neurological: Positive for weakness and numbness.  Hematological: Negative.   Psychiatric/Behavioral: Negative.      Objective: Vital Signs: BP (!) 152/102 (BP Location: Left Arm, Patient Position: Sitting)   Pulse 96   Ht  (1.803 m)   Wt (!) 380 lb (172.4 kg)   BMI 53.00 kg/m   Physical Exam  Constitutional: He is oriented to person, place, and time. He appears well-developed and well-nourished.  HENT:  Head: Normocephalic and atraumatic.  Eyes: Pupils are equal, round, and reactive to light. EOM are normal.  Neck: Normal range of motion. Neck supple.  Pulmonary/Chest: Effort normal and breath sounds normal.  Abdominal: Soft. Bowel sounds are normal.  Musculoskeletal: Normal range of motion.  Neurological: He is alert and oriented to person, place, and time.  Skin: Skin is warm and dry.  Psychiatric: He has a normal mood and affect. His behavior is normal. Judgment and thought content normal.    Ortho Exam  Specialty Comments:  No specialty comments available.  Imaging: No results found.   PMFS History: Patient Active Problem List  Diagnosis Date Noted  . Spinal stenosis, lumbar region, with neurogenic claudication 08/06/2016    Priority: High    Class: Chronic  . DDD (degenerative disc disease), lumbar 08/06/2016    Priority: High    Class: Chronic  . Lumbar disc herniation with radiculopathy 08/06/2016    Priority: High    Class: Chronic  . Neurogenic claudication due to lumbar spinal stenosis 08/06/2016  . Unspecified vitamin D deficiency 06/30/2014  . Right knee pain 06/30/2014  . Essential hypertension, benign 03/09/2014  . Anxiety and depression 03/09/2014  . Chronic low back pain 03/09/2014  . Neuropathic pain 03/09/2014  . Smoking 03/09/2014  .  Hemorrhoid 03/09/2014  . GERD (gastroesophageal reflux disease) 03/09/2014   Past Medical History:  Diagnosis Date  . ADHD (attention deficit hyperactivity disorder)   . Anxiety   . Arthritis    knee and back  . Chronic back pain   . Depression   . GERD (gastroesophageal reflux disease)   . Hypertension   . Neuropathy   . PONV (postoperative nausea and vomiting)     Family History  Problem Relation Age of Onset  . Heart failure Mother   . Heart disease Mother   . Diabetes Father   . Stroke Brother     Past Surgical History:  Procedure Laterality Date  . BACK SURGERY    . CARPAL TUNNEL RELEASE Left   . HAND SURGERY    . KNEE SURGERY Right   . TONSILLECTOMY     Social History   Occupational History  . Not on file.   Social History Main Topics  . Smoking status: Current Every Day Smoker    Packs/day: 1.00    Years: 18.00    Types: Cigarettes  . Smokeless tobacco: Never Used  . Alcohol use No     Comment: quit 2009  . Drug use: No  . Sexual activity: Not on file

## 2017-09-01 NOTE — Patient Instructions (Signed)
Avoid frequent bending and stooping  No lifting greater than 10 lbs. May use ice or moist heat for pain. Weight loss is of benefit. Avoid overhead lifting and overhead use of the arms. Do not lift greater than 5-10 lbs. Adjust head rest in vehicle to prevent hyperextension if rear ended. Take extra precautions to avoid falling, including use of a cane if you feel weak.

## 2017-09-09 DIAGNOSIS — F909 Attention-deficit hyperactivity disorder, unspecified type: Secondary | ICD-10-CM | POA: Diagnosis not present

## 2017-09-09 DIAGNOSIS — F331 Major depressive disorder, recurrent, moderate: Secondary | ICD-10-CM | POA: Diagnosis not present

## 2017-09-09 DIAGNOSIS — F410 Panic disorder [episodic paroxysmal anxiety] without agoraphobia: Secondary | ICD-10-CM | POA: Diagnosis not present

## 2017-09-17 ENCOUNTER — Emergency Department (HOSPITAL_COMMUNITY)
Admission: EM | Admit: 2017-09-17 | Discharge: 2017-09-17 | Disposition: A | Discharge status: Home / Self Care | Payer: Medicare Other | Attending: Emergency Medicine | Admitting: Emergency Medicine

## 2017-09-17 ENCOUNTER — Emergency Department (HOSPITAL_COMMUNITY): Payer: Medicare Other

## 2017-09-17 ENCOUNTER — Encounter (HOSPITAL_COMMUNITY): Payer: Self-pay | Admitting: *Deleted

## 2017-09-17 ENCOUNTER — Emergency Department (HOSPITAL_BASED_OUTPATIENT_CLINIC_OR_DEPARTMENT_OTHER): Admit: 2017-09-17 | Discharge: 2017-09-17 | Disposition: A | Discharge status: Home / Self Care | Payer: Medicare Other

## 2017-09-17 DIAGNOSIS — M79609 Pain in unspecified limb: Secondary | ICD-10-CM | POA: Diagnosis not present

## 2017-09-17 DIAGNOSIS — M79604 Pain in right leg: Secondary | ICD-10-CM | POA: Diagnosis not present

## 2017-09-17 DIAGNOSIS — Z79899 Other long term (current) drug therapy: Secondary | ICD-10-CM | POA: Diagnosis not present

## 2017-09-17 DIAGNOSIS — I10 Essential (primary) hypertension: Secondary | ICD-10-CM | POA: Diagnosis not present

## 2017-09-17 DIAGNOSIS — F17210 Nicotine dependence, cigarettes, uncomplicated: Secondary | ICD-10-CM | POA: Diagnosis not present

## 2017-09-17 DIAGNOSIS — M25561 Pain in right knee: Secondary | ICD-10-CM | POA: Diagnosis not present

## 2017-09-17 HISTORY — DX: Obesity, unspecified: E66.9

## 2017-09-17 MED ORDER — METHOCARBAMOL 500 MG PO TABS: 500.0000 mg | ORAL_TABLET | Freq: Two times a day (BID) | ORAL | 0 refills | Status: DC

## 2017-09-17 MED ORDER — DICLOFENAC SODIUM 1 % TD GEL: 4.0000 g | Freq: Four times a day (QID) | TRANSDERMAL | 0 refills | Status: DC

## 2017-09-17 MED ORDER — KETOROLAC TROMETHAMINE 60 MG/2ML IM SOLN
60.0000 mg | Freq: Once | INTRAMUSCULAR | Status: AC
  Administered 2017-09-17: 60 mg via INTRAMUSCULAR
  Filled 2017-09-17: qty 2

## 2017-09-17 MED ORDER — NAPROXEN 500 MG PO TABS: 500.0000 mg | ORAL_TABLET | Freq: Two times a day (BID) | ORAL | 0 refills | Status: DC

## 2017-09-17 MED ORDER — METHOCARBAMOL 500 MG PO TABS
1000.0000 mg | ORAL_TABLET | Freq: Once | ORAL | Status: AC
Start: 2017-09-17 — End: 2017-09-17
  Administered 2017-09-17: 1000 mg via ORAL
  Filled 2017-09-17: qty 2

## 2017-09-17 NOTE — Progress Notes (Signed)
Right lower extremity venous duplex has been completed. Preliminary results can be found in chart review -> CV Proc  09/17/17 5:00 PM Olen CordialGreg Lynnann Knudsen RVT

## 2017-09-17 NOTE — ED Triage Notes (Signed)
Pt reports right leg and knee pain with muscle spasms. Unsure of any injury. Ambulatory at triage.

## 2017-09-17 NOTE — ED Notes (Signed)
Notified XR that pt was ready.

## 2017-09-17 NOTE — Discharge Instructions (Signed)
You have been seen today for right leg/knee pain. There were no acute abnormalities on the x-rays, including no sign of fracture or dislocation.  Pain: Take 600 mg of ibuprofen every 6 hours or 440 mg (over the counter dose) to 500 mg (prescription dose) of naproxen every 12 hours for the next 3 days. After this time, these medications may be used as needed for pain. Take these medications with food to avoid upset stomach. Choose only one of these medications, do not take them together.  Tylenol: Should you continue to have additional pain while taking the ibuprofen or naproxen, you may add in tylenol as needed. Your daily total maximum amount of tylenol from all sources should be limited to 4000mg /day for persons without liver problems, or 2000mg /day for those with liver problems. Robaxin: Robaxin is a muscle relaxer and may help with spasming or cramping muscles.  Do not take Robaxin while driving or performing other dangerous activities. Ice: May apply ice to the area over the next 24 hours for 15 minutes at a time to reduce swelling. Elevation: Keep the extremity elevated as often as possible to reduce pain and inflammation. Support: Wear the knee brace for support and comfort. Wear this until pain resolves.  Exercises: Start by performing these exercises a few times a week, increasing the frequency until you are performing them twice daily.  Follow up: Please follow-up with the orthopedic specialist on this matter.  Call the number provided to set up an appointment.

## 2017-09-17 NOTE — ED Notes (Signed)
US at bedside preforming LE VENOUS

## 2017-09-17 NOTE — ED Notes (Signed)
ED Provider at bedside. 

## 2017-09-17 NOTE — ED Provider Notes (Signed)
[facility] EMERGENCY DEPARTMENT Provider Note   CSN: [id number] Arrival date & time: 09/17/17  1237     History   Chief Complaint Chief Complaint  Patient presents with  . Leg Pain    HPI [name] is a 41 y.o. male.  HPI   [name] is a 41 y.o. male, with a history of obesity, HTN, arthritis, and GERD, presenting to the ED with right lower extremity pain for the last 2-3 months.  Pain is cramping, located in the back of the knee and the calf, radiating into the shin, 8/10, worse with ambulation.  Patient endorses arthroscopic surgery to the right knee.  States he was told he would need a right knee replacement, however, he was also told he is too overweight and also too young.  He states his pain feels similar to the knee pain he experienced prior to his arthroscopic surgery.  Sees Dr. August Saucer, orthopedic surgeon for his knee. Has an orthopedic appointment next week. Denies trauma/falls, fever/chills, acute numbness, weakness, or any other complaints.      Past Medical History:  Diagnosis Date  . ADHD (attention deficit hyperactivity disorder)   . Anxiety   . Arthritis    knee and back  . Chronic back pain   . Depression   . GERD (gastroesophageal reflux disease)   . Hypertension   . Neuropathy   . Obesity   . PONV (postoperative nausea and vomiting)     Patient Active Problem List   Diagnosis Date Noted  . Spinal stenosis, lumbar region, with neurogenic claudication 08/06/2016    Class: Chronic  . DDD (degenerative disc disease), lumbar 08/06/2016    Class: Chronic  . Lumbar disc herniation with radiculopathy 08/06/2016    Class: Chronic  . Neurogenic claudication due to lumbar spinal stenosis 08/06/2016  . Unspecified vitamin D deficiency 06/30/2014  . Right knee pain 06/30/2014  . Essential hypertension, benign 03/09/2014  . Anxiety and depression 03/09/2014  . Chronic low back pain 03/09/2014  . Neuropathic pain 03/09/2014   . Smoking 03/09/2014  . Hemorrhoid 03/09/2014  . GERD (gastroesophageal reflux disease) 03/09/2014    Past Surgical History:  Procedure Laterality Date  . BACK SURGERY    . CARPAL TUNNEL RELEASE Left   . HAND SURGERY    . KNEE SURGERY Right   . TONSILLECTOMY         Home Medications    Prior to Admission medications   Medication Sig Start Date End Date Taking? Authorizing Provider  acetaminophen (TYLENOL) 500 MG tablet Take 1,000 mg by mouth every 6 (six) hours as needed for moderate pain.    [provider]  amphetamine-dextroamphetamine (ADDERALL XR) 30 MG 24 hr capsule Take 30 mg by mouth daily. 07/18/16   [provider]  celecoxib (CELEBREX) 200 MG capsule Take 1 capsule (200 mg total) by mouth 2 (two) times daily. 09/01/17   Kerrin Champagne, MD  cyclobenzaprine (FLEXERIL) 10 MG tablet Take 1 tablet (10 mg total) by mouth 3 (three) times daily as needed for muscle spasms. 11/01/16   Kerrin Champagne, MD  diclofenac sodium (VOLTAREN) 1 % GEL Apply 4 g topically 4 (four) times daily. 09/17/17   Marvella Jenning C, PA-C  gabapentin (NEURONTIN) 800 MG tablet Take 1 tablet (800 mg total) by mouth 2 (two) times daily. 09/01/17   Kerrin Champagne, MD  gemfibrozil (LOPID) 600 MG tablet Take 600 mg by mouth 2 (two) times daily. 05/16/15  [provider]  meloxicam (MOBIC) 15 MG tablet Take 15 mg by mouth daily. 08/09/16   [provider]  methocarbamol (ROBAXIN) 500 MG tablet Take 1 tablet (500 mg total) by mouth 2 (two) times daily. 09/17/17   Aadvika Konen C, PA-C  metoprolol succinate (TOPROL-XL) 25 MG 24 hr tablet Take 1 tablet (25 mg total) by mouth 2 (two) times daily. Take 1 tablet BID for hypertention 09/01/17   Kerrin ChampagneNitka, James E, MD  naproxen (NAPROSYN) 500 MG tablet Take 1 tablet (500 mg total) by mouth 2 (two) times daily. 09/17/17   Daivik Overley C, PA-C  Oxycodone HCl 10 MG TABS Take 1 tablet (10 mg total) by mouth 3 (three) times daily as needed. Take one tablet  po every 8-12 hours prn pain. 12/23/16   Kerrin ChampagneNitka, James E, MD    Family History Family History  Problem Relation Age of Onset  . Heart failure Mother   . Heart disease Mother   . Diabetes Father   . Stroke Brother     Social History Social History  Substance Use Topics  . Smoking status: Current Every Day Smoker    Packs/day: 1.00    Years: 18.00    Types: Cigarettes  . Smokeless tobacco: Never Used  . Alcohol use No     Comment: quit 2009     Allergies   Tramadol and Vicodin [hydrocodone-acetaminophen]   Review of Systems Review of Systems  Constitutional: Negative for chills and fever.  Musculoskeletal: Positive for arthralgias and myalgias.  Neurological: Negative for weakness and numbness.     Physical Exam Updated Vital Signs BP (!) 145/86 (BP Location: Left Arm)   Pulse 77   Temp 98 F (36.7 C) (Oral)   Resp 16   Ht 5\' 11"  (1.803 m)   Wt (!) 163.3 kg (360 lb)   SpO2 98%   BMI 50.21 kg/m   Physical Exam  Constitutional: He appears well-developed and well-nourished. No distress.  HENT:  Head: Normocephalic and atraumatic.  Eyes: Conjunctivae are normal.  Neck: Neck supple.  Cardiovascular: Normal rate, regular rhythm and intact distal pulses.   Pulses:      Dorsalis pedis pulses are 2+ on the right side, and 2+ on the left side.       Posterior tibial pulses are 2+ on the right side, and 2+ on the left side.  No pallor, coolness, or decreased pulses in the feet bilaterally.  Pulmonary/Chest: Effort normal.  Musculoskeletal: He exhibits tenderness. He exhibits no edema.  Tenderness to the posterior right knee.  Tenderness extends into the posterior right calf.  Worse with dorsiflexion. Full passive and active range of motion of the right ankle, knee, and hip.  No noted swelling, erythema, increased warmth, effusion, deformity, or laxity.  Neurological: He is alert.  No acute sensory deficits noted in the right lower extremity. Strength 5/5 with  flexion and extension at the right hip, knee, and ankle.  Skin: Skin is warm and dry. Capillary refill takes less than 2 seconds. He is not diaphoretic. No pallor.  Psychiatric: He has a normal mood and affect. His behavior is normal.  Nursing note and vitals reviewed.    ED Treatments / Results  Labs (all labs ordered are listed, but only abnormal results are displayed) Labs Reviewed - No data to display  EKG  EKG Interpretation None       Radiology Dg Knee Complete 4 Views Right  Result Date: 09/17/2017 CLINICAL DATA:  Right knee  pain.  No known injury. EXAM: RIGHT KNEE - COMPLETE 4+ VIEW COMPARISON:  09/29/2015 FINDINGS: Mild osteoarthritis involving all 3 compartments with joint space narrowing and early spurring. No acute bony abnormality. Specifically, no fracture, subluxation, or dislocation. Soft tissues are intact. No joint effusion. IMPRESSION: Early osteoarthritis.  No acute findings. Electronically Signed   By: Charlett Nose M.D.   On: 09/17/2017 17:17    Procedures Procedures (including critical care time)  Medications Ordered in ED Medications  methocarbamol (ROBAXIN) tablet 1,000 mg (1,000 mg Oral Given 09/17/17 1653)  ketorolac (TORADOL) injection 60 mg (60 mg Intramuscular Given 09/17/17 1654)     Initial Impression / Assessment and Plan / ED Course  I have reviewed the triage vital signs and the nursing notes.  Pertinent labs & imaging results that were available during my care of the patient were reviewed by me and considered in my medical decision making (see chart for details).       Patient presents with right lower extremity pain, seemingly coming from the right knee.  No signs of DVT on duplex ultrasound.  No acute abnormalities on x-ray.  Doubt septic joint.  Orthopedic follow-up.  Patient declined knee sleeve. The patient was given instructions for home care as well as return precautions. Patient voices understanding of these instructions, accepts  the plan, and is comfortable with discharge.     Final Clinical Impressions(s) / ED Diagnoses   Final diagnoses:  Right leg pain    New Prescriptions New Prescriptions   DICLOFENAC SODIUM (VOLTAREN) 1 % GEL    Apply 4 g topically 4 (four) times daily.   METHOCARBAMOL (ROBAXIN) 500 MG TABLET    Take 1 tablet (500 mg total) by mouth 2 (two) times daily.   NAPROXEN (NAPROSYN) 500 MG TABLET    Take 1 tablet (500 mg total) by mouth 2 (two) times daily.     Anselm Pancoast, PA-C 09/17/17 1754    Cathren Laine, MD 09/18/17 [phone]

## 2017-09-24 ENCOUNTER — Ambulatory Visit (INDEPENDENT_AMBULATORY_CARE_PROVIDER_SITE_OTHER): Payer: Medicare Other | Admitting: Orthopedic Surgery

## 2017-09-24 ENCOUNTER — Encounter (INDEPENDENT_AMBULATORY_CARE_PROVIDER_SITE_OTHER): Payer: Self-pay | Admitting: Orthopedic Surgery

## 2017-09-24 DIAGNOSIS — G8929 Other chronic pain: Secondary | ICD-10-CM

## 2017-09-24 DIAGNOSIS — M25561 Pain in right knee: Secondary | ICD-10-CM

## 2017-09-24 MED ORDER — HYDROCODONE-ACETAMINOPHEN 5-325 MG PO TABS: ORAL_TABLET | ORAL | 0 refills | Status: DC

## 2017-09-24 NOTE — Progress Notes (Signed)
Office Visit Note   Patient: [name]           Date of Birth: 02/25/1976           MRN: [id number] Visit Date: 09/24/2017 Requested by: No referring provider defined for this encounter. PCP: Patient, No Pcp Per  Subjective: Chief Complaint  Patient presents with  . Right Knee - Pain    HPI: [name] is a 41 year old patient with right knee pain.  Reports chronic right knee pain.  Had arthroscopy 2 years ago and had some arthritis and a meniscal tear at that time.  Reports swelling and difficulty weightbearing.  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  He has had constant pain for the last 3 weeks with no injury.  His narcotic medication regimen and prior prescriptions are reviewed.  Last prescription 07/04/2017 by Dr. Otelia SergeantNitka denies any groin pain or numbness and tingling in the leg.              ROS: All systems reviewed are negative as they relate to the chief complaint within the history of present illness.  Patient denies  fevers or chills.   Assessment & Plan: Visit Diagnoses:  1. Chronic pain of right knee     Plan: Impression is right knee pain in a morbidly obese patient who has likely progressive arthritis developing in the knee.  No mechanical symptoms just pain.  Plan is to refer to pain management.  The risks of narcotic medication discussed with the patient.  Continue with ibuprofen.  Norco one by mouth daily #20 prescribed with no refills.  Weight loss encouraged.  Follow-up as needed  Follow-Up Instructions: Return if symptoms worsen or fail to improve.   Orders:  Orders Placed This Encounter  Procedures  . Ambulatory referral to Pain Clinic   Meds ordered this encounter  Medications  . HYDROcodone-acetaminophen (NORCO/VICODIN) 5-325 MG tablet    Sig: 1 po q d x 20 days    Dispense:  20 tablet    Refill:  0      Procedures: No procedures performed   Clinical Data: No additional findings.  Objective: Vital  Signs: There were no vitals taken for this visit.  Physical Exam:   Constitutional: Patient appears well-developed HEENT:  Head: Normocephalic Eyes:EOM are normal Neck: Normal range of motion Cardiovascular: Normal rate Pulmonary/chest: Effort normal Neurologic: Patient is alert Skin: Skin is warm Psychiatric: Patient has normal mood and affect    Ortho Exam: Orthopedic exam demonstrates increased by mass index fairly normal gait and alignment trace right knee effusion intact extensor mechanism palpable pedal pulses collateral and cruciate ligaments are stable in the knee.  Extensor mechanism is intact.  Specialty Comments:  No specialty comments available.  Imaging: No results found.   PMFS History: Patient Active Problem List   Diagnosis Date Noted  . Spinal stenosis, lumbar region, with neurogenic claudication 08/06/2016    Class: Chronic  . DDD (degenerative disc disease), lumbar 08/06/2016    Class: Chronic  . Lumbar disc herniation with radiculopathy 08/06/2016    Class: Chronic  . Neurogenic claudication due to lumbar spinal stenosis 08/06/2016  . Unspecified vitamin D deficiency 06/30/2014  . Right knee pain 06/30/2014  . Essential hypertension, benign 03/09/2014  . Anxiety and depression 03/09/2014  . Chronic low back pain 03/09/2014  . Neuropathic pain 03/09/2014  . Smoking 03/09/2014  . Hemorrhoid 03/09/2014  . GERD (gastroesophageal reflux disease) 03/09/2014   Past  Medical History:  Diagnosis Date  . ADHD (attention deficit hyperactivity disorder)   . Anxiety   . Arthritis    knee and back  . Chronic back pain   . Depression   . GERD (gastroesophageal reflux disease)   . Hypertension   . Neuropathy   . Obesity   . PONV (postoperative nausea and vomiting)     Family History  Problem Relation Age of Onset  . Heart failure Mother   . Heart disease Mother   . Diabetes Father   . Stroke Brother     Past Surgical History:  Procedure  Laterality Date  . BACK SURGERY    . CARPAL TUNNEL RELEASE Left   . HAND SURGERY    . KNEE SURGERY Right   . TONSILLECTOMY     Social History   Occupational History  . Not on file.   Social History Main Topics  . Smoking status: Current Every Day Smoker    Packs/day: 1.00    Years: 18.00    Types: Cigarettes  . Smokeless tobacco: Never Used  . Alcohol use No     Comment: quit 2009  . Drug use: No  . Sexual activity: Not on file

## 2017-09-26 IMAGING — DX DG LUMBAR SPINE 2-3V
4 series · 4 of 4 positions shown · non-contrast
Comparison: CT of the lumbosacral spine 08/29/2016

CLINICAL DATA: Spinal fusion on [DATE] with postoperative infection
with pain and drainage.

EXAM:
LUMBAR SPINE - 2-3 VIEW

[l-spine ap]
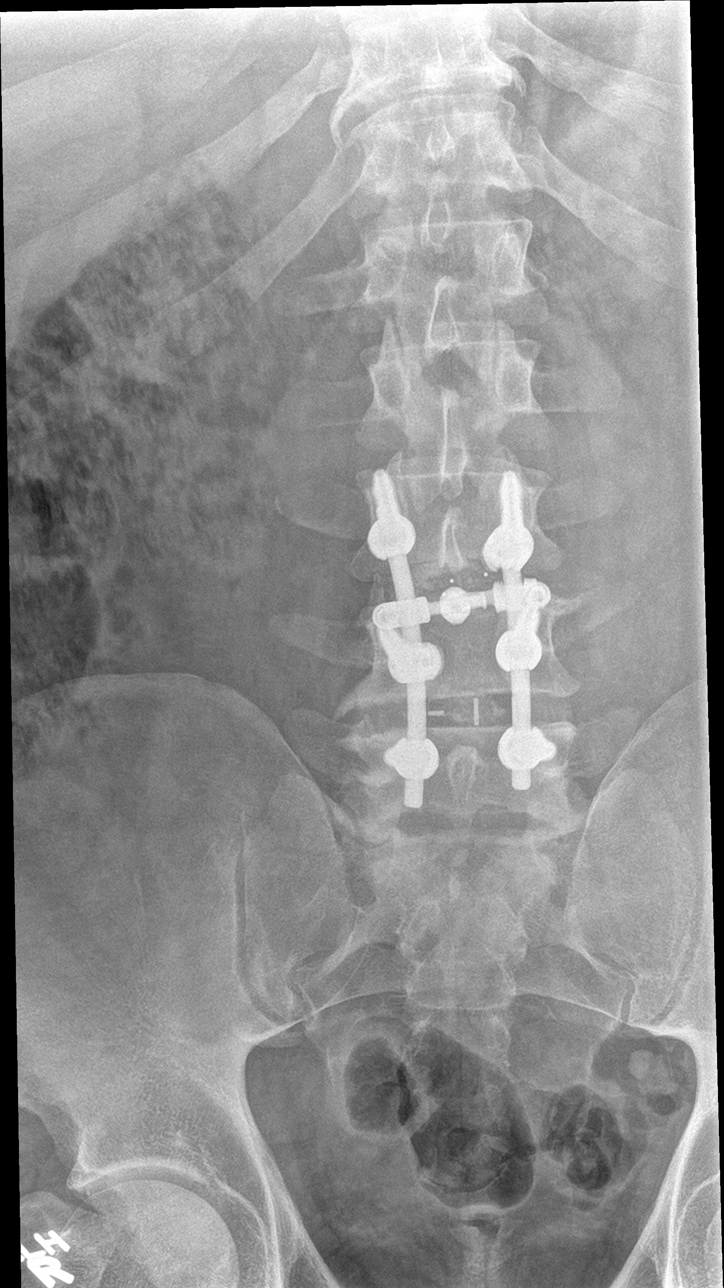

[l-spine lat]
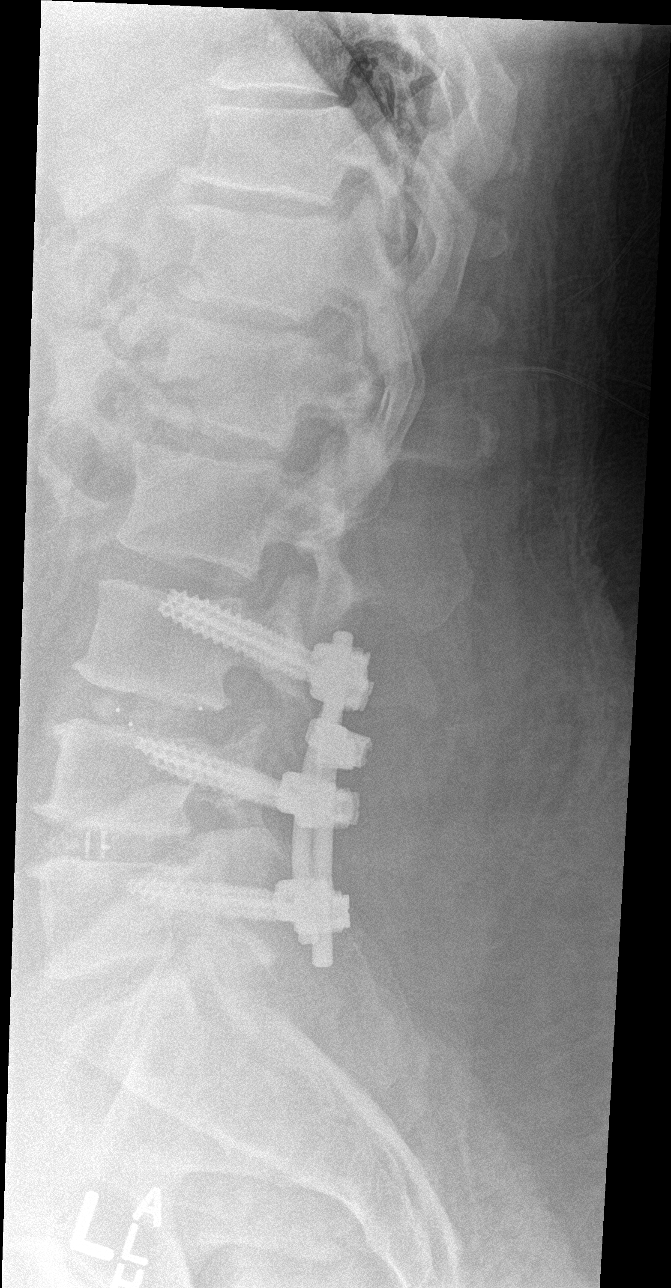

[l-spine spot (1 of 2)]
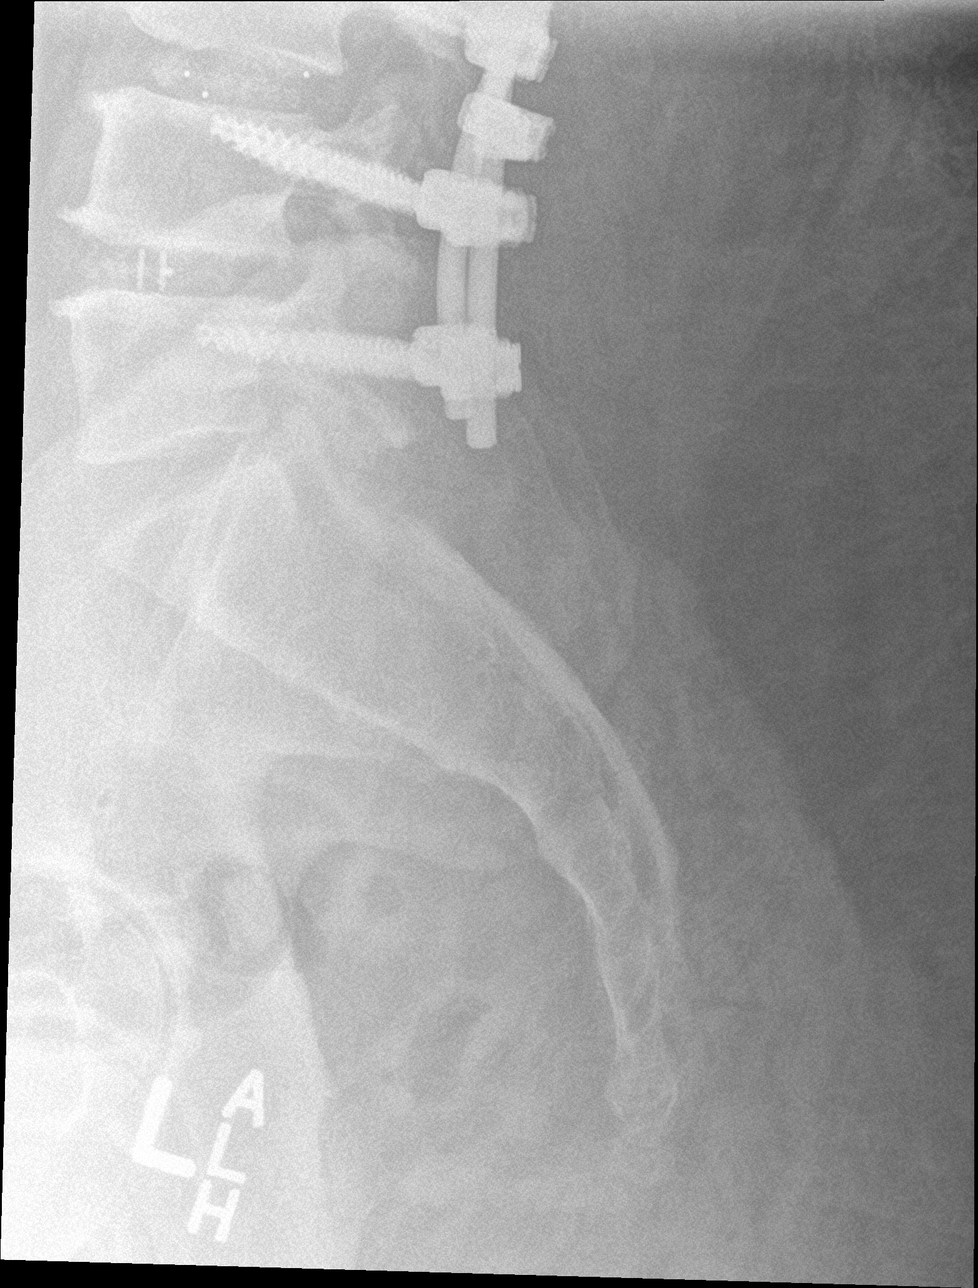

[l-spine spot (2 of 2)]
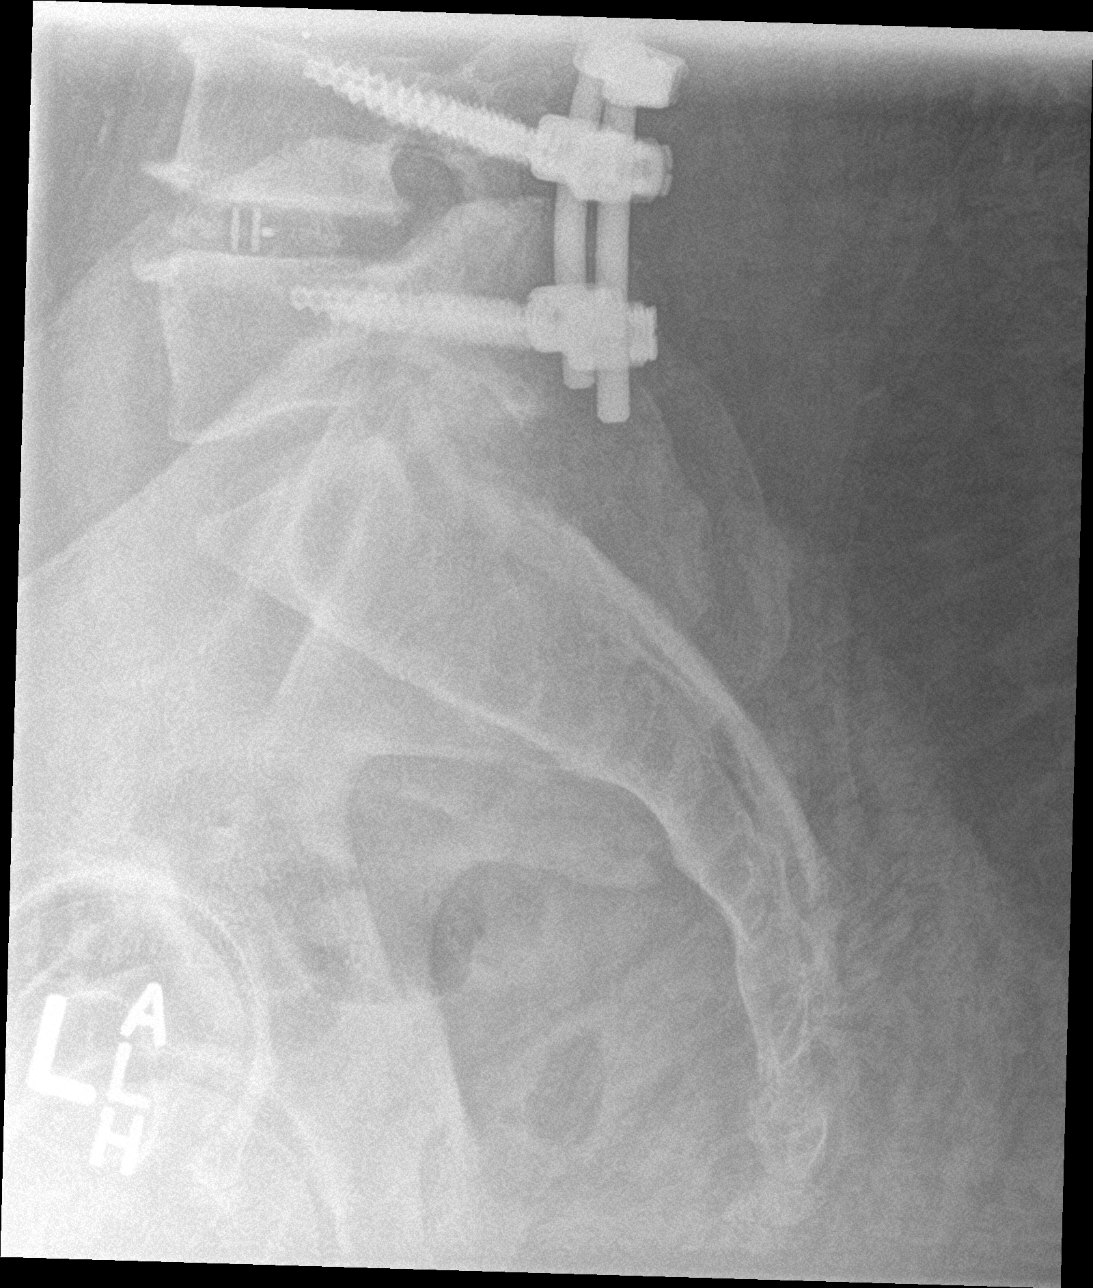

[4 of 4 positions shown; findings below may reference images not displayed]

FINDINGS: There is no evidence of lumbar spine fracture. Alignment is normal.
L3-L5 spinal decompensation and fusion is stable in appearance. No
evidence of loosening. No evidence of hardware fracture.
IMPRESSION: L3-L5 decompensation and fusion without radiographically apparent
evidence of complications.

## 2017-10-01 ENCOUNTER — Ambulatory Visit (INDEPENDENT_AMBULATORY_CARE_PROVIDER_SITE_OTHER): Payer: Medicare Other | Admitting: Specialist

## 2017-10-01 NOTE — Telephone Encounter (Signed)
This encounter was created in error - please disregard.

## 2017-10-02 NOTE — Telephone Encounter (Signed)
This encounter was created in error - please disregard.

## 2017-10-06 ENCOUNTER — Inpatient Hospital Stay: Payer: Medicare Other

## 2017-10-06 ENCOUNTER — Encounter: Payer: Self-pay | Admitting: Internal Medicine

## 2017-10-06 ENCOUNTER — Other Ambulatory Visit: Payer: Self-pay

## 2017-10-06 ENCOUNTER — Ambulatory Visit (INDEPENDENT_AMBULATORY_CARE_PROVIDER_SITE_OTHER): Payer: Medicare Other | Admitting: Internal Medicine

## 2017-10-06 VITALS — BP 124/73 | HR 82 | Temp 97.5°F | Ht 71.0 in | Wt 398.1 lb

## 2017-10-06 DIAGNOSIS — L818 Other specified disorders of pigmentation: Secondary | ICD-10-CM | POA: Diagnosis not present

## 2017-10-06 DIAGNOSIS — F909 Attention-deficit hyperactivity disorder, unspecified type: Secondary | ICD-10-CM | POA: Diagnosis not present

## 2017-10-06 DIAGNOSIS — M48062 Spinal stenosis, lumbar region with neurogenic claudication: Secondary | ICD-10-CM | POA: Diagnosis not present

## 2017-10-06 DIAGNOSIS — Z79891 Long term (current) use of opiate analgesic: Secondary | ICD-10-CM | POA: Diagnosis not present

## 2017-10-06 DIAGNOSIS — M545 Low back pain: Secondary | ICD-10-CM | POA: Diagnosis not present

## 2017-10-06 DIAGNOSIS — G629 Polyneuropathy, unspecified: Secondary | ICD-10-CM

## 2017-10-06 DIAGNOSIS — Z6841 Body Mass Index (BMI) 40.0 and over, adult: Secondary | ICD-10-CM | POA: Diagnosis not present

## 2017-10-06 DIAGNOSIS — M5416 Radiculopathy, lumbar region: Secondary | ICD-10-CM

## 2017-10-06 DIAGNOSIS — I10 Essential (primary) hypertension: Secondary | ICD-10-CM

## 2017-10-06 DIAGNOSIS — F17210 Nicotine dependence, cigarettes, uncomplicated: Secondary | ICD-10-CM

## 2017-10-06 DIAGNOSIS — M7989 Other specified soft tissue disorders: Secondary | ICD-10-CM

## 2017-10-06 DIAGNOSIS — F418 Other specified anxiety disorders: Secondary | ICD-10-CM

## 2017-10-06 DIAGNOSIS — E559 Vitamin D deficiency, unspecified: Secondary | ICD-10-CM

## 2017-10-06 DIAGNOSIS — G8929 Other chronic pain: Secondary | ICD-10-CM

## 2017-10-06 DIAGNOSIS — M4320 Fusion of spine, site unspecified: Secondary | ICD-10-CM

## 2017-10-06 DIAGNOSIS — M792 Neuralgia and neuritis, unspecified: Secondary | ICD-10-CM

## 2017-10-06 DIAGNOSIS — Z0189 Encounter for other specified special examinations: Secondary | ICD-10-CM | POA: Diagnosis present

## 2017-10-06 DIAGNOSIS — Z79899 Other long term (current) drug therapy: Secondary | ICD-10-CM | POA: Diagnosis not present

## 2017-10-06 DIAGNOSIS — R51 Headache: Secondary | ICD-10-CM

## 2017-10-06 DIAGNOSIS — F419 Anxiety disorder, unspecified: Secondary | ICD-10-CM

## 2017-10-06 DIAGNOSIS — F329 Major depressive disorder, single episode, unspecified: Secondary | ICD-10-CM

## 2017-10-06 DIAGNOSIS — F17200 Nicotine dependence, unspecified, uncomplicated: Secondary | ICD-10-CM

## 2017-10-06 MED ORDER — METHOCARBAMOL 500 MG PO TABS: 500.0000 mg | ORAL_TABLET | Freq: Two times a day (BID) | ORAL | 0 refills | Status: DC | PRN

## 2017-10-06 NOTE — Patient Instructions (Addendum)
We are [facility]. I recommend continuing your current treatments with antiinflammatory medicines and muscle relaxants.  For additional opioid pain medications please contact your surgeon's office until you are able to be seen with Dr. Derry LoryPlummer's office.  I am checking some basic lab work today to make sure there is no obvious deficiency contributing to your neuropathy that might be treatable. You have a history of vitamin D deficiency.

## 2017-10-06 NOTE — Progress Notes (Signed)
CC: Visit to establish care with a new PCP  HPI:  [name] is a 41 y.o. male with PMHx detailed below presenting with chronic low back and leg pain.  See problem based assessment and plan below for additional details.  Spinal stenosis, lumbar region, with neurogenic claudication He has chronic low back pain with radicular symptoms that has been bothering him continuously for several years. He underwent laminectomy with interbody fusion last year that partially improved his back pain but it is still very activity limiting. The pain in his back and legs is very preoccupying and he feels it stops him from doing many activities, such as fishing ,and he can only tolerate driving on a good day. He also reports some numbness in both feet that has been stable during the past year. He has been on several different treatment doses with oral oxycodone for this pain over the past few years. It is not clear what the exact functional benefits have been. Most recently he was prescribed medicines by his orthopedist and before that with Dr. Tollie EthPlummer at pain management clinic. He says that suboxone made him extremely sick when this was tried earlier this year and is resistant to repeat challenging this soon. He is not a good candidate for opioid prescribing in the [facility] since he will need longitudinal follow up to assess for safety and symptom control with his very minimal baseline activity. Plan Referral to pain management today I recommended he would need his orthopedics office to prescribe any oxycodone in the interval  Essential hypertension, benign His blood pressure is well controlled today at 124/73 on metoprolol 25mg . He is not having dizzy spells or headaches and his leg swelling has been stable. We can continue his current metoprolol alone at this time.  Anxiety and depression He is followed at the [facility] for adult ADHD and also depression with anxiety features. He is on adderall XR  and also klonopin 0.5mg  BID. He follows up with them every 2 months for assessment and medication refills.  Smoking He continues to smoke cigarettes are 1/2 ppd. This is a decrease from previously going thorough 1-1.5ppd. He generally does not become short of breath with activity as he stops from pain before this. He does not complain of a chronic cough.  Neuropathic pain He has bilateral foot pain that does not appear consistent with radiculopathy. This is associated with diminished sensation and is a constant sensation. This does not radiate in his extremities. He does not have diabetes so the mechanism for a neuropathy process is unclear. He is scheduled with neurology for nerve conduction study later this month to assess his lower extremities. With dysphoria and neuropathy I do think checking for obvious electrolyte deficiency or vitamin D deficiency is worthwhile to supplement these if low.    Past Medical History:  Diagnosis Date  . ADHD (attention deficit hyperactivity disorder)   . Anxiety   . Arthritis    knee and back  . Chronic back pain   . Depression   . GERD (gastroesophageal reflux disease)   . Hypertension   . Neuropathy   . Obesity   . PONV (postoperative nausea and vomiting)     Review of Systems: Review of Systems  Constitutional: Negative for chills and fever.  HENT: Negative for hearing loss and sore throat.   Eyes: Negative for blurred vision.  Respiratory: Positive for sputum production. Negative for cough.   Cardiovascular: Positive for leg swelling. Negative for chest pain.  Gastrointestinal: Negative for constipation and diarrhea.  Genitourinary: Negative for frequency.  Musculoskeletal: Positive for back pain and joint pain. Negative for falls.  Skin: Negative for rash.  Neurological: Positive for headaches.  Psychiatric/Behavioral: The patient is nervous/anxious.      Physical Exam: Vitals:   10/06/17 1547  BP: 124/73  Pulse: 82  Temp: (!)  97.5 F (36.4 C)  TempSrc: Oral  SpO2: 100%  Weight: (!) 398 lb 1.6 oz (180.6 kg)  Height: 5\' 11"  (1.803 m)   GENERAL- Morbidly obese man sitting in no acute distress, slightly unkempt appearance HEENT- Moist oral mucosa, conjunctivae clear CARDIAC- RRR, no murmurs, rubs or gallops. RESP- Distant breath sounds, CTAB ABDOMEN- Obese, nontender, no guarding or rebound, normoactive bowel sounds present NEURO- Sensation grossly intact globally EXTREMITIES- Symmetric, 1+ pitting edema in lower extremities SKIN- Warm, dry, many tattoos, faint discoloration over medial right ankle PSYCH- Normal mood and affect, appropriate thought content and speech.    Assessment & Plan:   See encounters tab for problem based medical decision making.   Patient discussed with Dr. Cleda DaubE. Hoffman

## 2017-10-06 NOTE — Progress Notes (Deleted)
Patient ID: [name], male   DOB: 10-Jul-1976, 41 y.o.   MRN: [id number] After being seen in the ED 09/17/2017 for RLE pain for a few months. +h/o arthroscopy to R knee and being "told he needs knee replacement."  He was given Robaxin and a toradol injection in the ED.  There was no sign of DVT on U/S.  He saw ortho on 09/24/2017 and was prescribed Norco and referred to pain management.

## 2017-10-07 ENCOUNTER — Telehealth: Payer: Self-pay | Admitting: General Practice

## 2017-10-07 ENCOUNTER — Encounter: Payer: Self-pay | Admitting: General Practice

## 2017-10-07 LAB — BMP8+ANION GAP
Anion Gap: 15 mmol/L (ref 10.0–18.0)
BUN / CREAT RATIO: 17 (ref 9–20)
BUN: 18 mg/dL (ref 6–24)
CHLORIDE: 100 mmol/L (ref 96–106)
CO2: 24 mmol/L (ref 20–29)
Calcium: 9 mg/dL (ref 8.7–10.2)
Creatinine, Ser: 1.08 mg/dL (ref 0.76–1.27)
GFR calc Af Amer: 98 mL/min/{1.73_m2} (ref >59)
GFR calc non Af Amer: 85 mL/min/{1.73_m2} (ref >59)
GLUCOSE: 87 mg/dL (ref 65–99)
Potassium: 5 mmol/L (ref 3.5–5.2)
SODIUM: 139 mmol/L (ref 134–144)

## 2017-10-07 LAB — VITAMIN D 25 HYDROXY (VIT D DEFICIENCY, FRACTURES): VIT D 25 HYDROXY: 25.4 ng/mL — AB (ref 30.0–100.0)

## 2017-10-07 MED ORDER — ACETAMINOPHEN-CODEINE #3 300-30 MG PO TABS: 1.0000 | ORAL_TABLET | Freq: Four times a day (QID) | ORAL | 0 refills | Status: DC | PRN

## 2017-10-07 NOTE — Telephone Encounter (Signed)
Called patient back. He will pick up prescriptions later today.

## 2017-10-07 NOTE — Telephone Encounter (Signed)
Patient requesting the tylenol 3. Pls call patient

## 2017-10-08 NOTE — Assessment & Plan Note (Signed)
He is followed at the [facility] for adult ADHD and also depression with anxiety features. He is on adderall XR and also klonopin 0.5mg  BID. He follows up with them every 2 months for assessment and medication refills.

## 2017-10-08 NOTE — Assessment & Plan Note (Signed)
He continues to smoke cigarettes are 1/2 ppd. This is a decrease from previously going thorough 1-1.5ppd. He generally does not become short of breath with activity as he stops from pain before this. He does not complain of a chronic cough.

## 2017-10-08 NOTE — Assessment & Plan Note (Signed)
He has chronic low back pain with radicular symptoms that has been bothering him continuously for several years. He underwent laminectomy with interbody fusion last year that partially improved his back pain but it is still very activity limiting. The pain in his back and legs is very preoccupying and he feels it stops him from doing many activities, such as fishing ,and he can only tolerate driving on a good day. He also reports some numbness in both feet that has been stable during the past year. He has been on several different treatment doses with oral oxycodone for this pain over the past few years. It is not clear what the exact functional benefits have been. Most recently he was prescribed medicines by his orthopedist and before that with Dr. Tollie EthPlummer at pain management clinic. He says that suboxone made him extremely sick when this was tried earlier this year and is resistant to repeat challenging this soon. He is not a good candidate for opioid prescribing [facility] the [facility] since he will need longitudinal follow up to assess for safety and symptom control with his very minimal baseline activity. Plan Referral to pain management today I recommended he would need his orthopedics office to prescribe any oxycodone in the interval

## 2017-10-08 NOTE — Assessment & Plan Note (Signed)
His blood pressure is well controlled today at 124/73 on metoprolol 25mg . He is not having dizzy spells or headaches and his leg swelling has been stable. We can continue his current metoprolol alone at this time.

## 2017-10-08 NOTE — Assessment & Plan Note (Addendum)
He has bilateral foot pain that does not appear consistent with radiculopathy. This is associated with diminished sensation and is a constant sensation. This does not radiate in his extremities. He does not have diabetes so the mechanism for a neuropathy process is unclear. He is scheduled with neurology for nerve conduction study later this month to assess his lower extremities. With dysphoria and neuropathy I do think checking for obvious electrolyte deficiency or vitamin D deficiency is worthwhile to supplement these if low.

## 2017-10-09 NOTE — Progress Notes (Signed)
Internal Medicine Clinic Attending  Case discussed with Dr. Rice at the time of the visit.  We reviewed the resident's history and exam and pertinent patient test results.  I agree with the assessment, diagnosis, and plan of care documented in the resident's note.  

## 2017-10-21 ENCOUNTER — Telehealth (INDEPENDENT_AMBULATORY_CARE_PROVIDER_SITE_OTHER): Payer: Self-pay | Admitting: Orthopedic Surgery

## 2017-10-21 NOTE — Telephone Encounter (Signed)
no

## 2017-10-21 NOTE — Telephone Encounter (Signed)
Patient called needing Rx refilled (Vicodin 5 mg) Patient advised his knee is popping and hurting a lot worse. The number to contact patient is [phone]

## 2017-10-21 NOTE — Telephone Encounter (Signed)
Please advise. See recent notes/med in chart.

## 2017-10-22 ENCOUNTER — Telehealth (INDEPENDENT_AMBULATORY_CARE_PROVIDER_SITE_OTHER): Payer: Self-pay | Admitting: Orthopedic Surgery

## 2017-10-22 NOTE — Telephone Encounter (Signed)
IC patient and LMVM advised.

## 2017-10-22 NOTE — Telephone Encounter (Signed)
Patient called to give another number to reach him The number is [phone]. Patient said the other number is not working.

## 2017-10-22 NOTE — Telephone Encounter (Signed)
IC LM for patient advising about message from yesterday per Dr August Saucerean.

## 2017-10-28 ENCOUNTER — Other Ambulatory Visit: Payer: Self-pay

## 2017-10-28 ENCOUNTER — Telehealth (INDEPENDENT_AMBULATORY_CARE_PROVIDER_SITE_OTHER): Payer: Self-pay | Admitting: Orthopedic Surgery

## 2017-10-28 ENCOUNTER — Telehealth: Payer: Self-pay | Admitting: *Deleted

## 2017-10-28 DIAGNOSIS — M25561 Pain in right knee: Principal | ICD-10-CM

## 2017-10-28 DIAGNOSIS — G8929 Other chronic pain: Secondary | ICD-10-CM

## 2017-10-28 NOTE — Telephone Encounter (Signed)
Patient called advised he want to be referred to another physician.Patient said he is in a lot of pain.  The number to contact patient is [phone]

## 2017-10-28 NOTE — Telephone Encounter (Signed)
Requesting Tylenol #3 to be filled. Please call pt back.

## 2017-10-28 NOTE — Telephone Encounter (Signed)
Request sent 

## 2017-10-28 NOTE — Telephone Encounter (Signed)
Pls rf him to another doctor  - any who will take him is ok with me

## 2017-10-28 NOTE — Telephone Encounter (Signed)
Pt called again, stating he is in pain. Would like med by today. Please call pt back.

## 2017-10-28 NOTE — Telephone Encounter (Signed)
Please advise 

## 2017-10-29 ENCOUNTER — Ambulatory Visit (INDEPENDENT_AMBULATORY_CARE_PROVIDER_SITE_OTHER): Payer: Medicare Other | Admitting: Internal Medicine

## 2017-10-29 VITALS — BP 150/109 | HR 63 | Temp 97.6°F | Ht 71.0 in | Wt 392.8 lb

## 2017-10-29 DIAGNOSIS — M48062 Spinal stenosis, lumbar region with neurogenic claudication: Secondary | ICD-10-CM | POA: Diagnosis not present

## 2017-10-29 DIAGNOSIS — Z23 Encounter for immunization: Secondary | ICD-10-CM | POA: Diagnosis present

## 2017-10-29 DIAGNOSIS — G8929 Other chronic pain: Secondary | ICD-10-CM

## 2017-10-29 DIAGNOSIS — F17210 Nicotine dependence, cigarettes, uncomplicated: Secondary | ICD-10-CM | POA: Diagnosis not present

## 2017-10-29 DIAGNOSIS — K219 Gastro-esophageal reflux disease without esophagitis: Secondary | ICD-10-CM

## 2017-10-29 DIAGNOSIS — E669 Obesity, unspecified: Secondary | ICD-10-CM

## 2017-10-29 DIAGNOSIS — M5442 Lumbago with sciatica, left side: Secondary | ICD-10-CM

## 2017-10-29 DIAGNOSIS — Z6841 Body Mass Index (BMI) 40.0 and over, adult: Secondary | ICD-10-CM | POA: Diagnosis not present

## 2017-10-29 DIAGNOSIS — M5416 Radiculopathy, lumbar region: Secondary | ICD-10-CM

## 2017-10-29 DIAGNOSIS — Z79899 Other long term (current) drug therapy: Secondary | ICD-10-CM

## 2017-10-29 DIAGNOSIS — Z79891 Long term (current) use of opiate analgesic: Secondary | ICD-10-CM

## 2017-10-29 DIAGNOSIS — Z981 Arthrodesis status: Secondary | ICD-10-CM

## 2017-10-29 MED ORDER — HYDROCODONE-ACETAMINOPHEN 5-325 MG PO TABS
1.0000 | ORAL_TABLET | Freq: Two times a day (BID) | ORAL | Status: DC | PRN
Start: 2017-10-29 — End: 2017-10-29

## 2017-10-29 MED ORDER — CYCLOBENZAPRINE HCL 10 MG PO TABS: 10.0000 mg | ORAL_TABLET | Freq: Three times a day (TID) | ORAL | 3 refills | Status: DC | PRN

## 2017-10-29 MED ORDER — HYDROCODONE-ACETAMINOPHEN 5-325 MG PO TABS: 1.0000 | ORAL_TABLET | Freq: Two times a day (BID) | ORAL | 0 refills | Status: DC | PRN

## 2017-10-29 MED ORDER — OMEPRAZOLE 20 MG PO CPDR: 20.0000 mg | DELAYED_RELEASE_CAPSULE | Freq: Every day | ORAL | 2 refills | Status: DC

## 2017-10-29 NOTE — Patient Instructions (Addendum)
Thank you for allowing us to care for you.  For your back pain: - We have provided you with a refill of your flexeril - We have provided you with 21 days of pain medication to help with relief until your appointment with the pain doctor, who will be calling you to schedule an appointment - We have given you a referral to physical therapy  For your reflux: - We have provided you with a prescription for Omeprazole to be taken daily with meals.  You also received your flu shot today  Please follow up with your PCP in 2 months

## 2017-10-29 NOTE — Progress Notes (Signed)
   CC: Back pain, GERD  HPI:  [name] is a 41 y.o. M with PMHx listed below presenting for Back pain, GERD. Please see the A&P for the status of the patient's chronic medical problems.  Patient has a history of chronic back pain with radicular symptoms 2/2 his lumbar spinal stenosis. He is followed by orthopedics. His back pain has been worse for the past 1 week. Pain is described as the same pain as usually pain, but worsened to a constant 10/10. He denies any inciting event, he just noticed that it was worse starting about 1 week ago. Of note, he also ran out of his pain medication 1 week ago, but states that his pain is worse than his baseline without pain medication.  He has collapsed several times due to associated pain and possible weakness in his legs.  He has called his orthopedist and has a visit scheduled in the next 2 weeks. He is currently taking Celebrex and gabapentin for his radicular symptoms. He additionally takes flexeril, but is out of this medication. Patient was referred to pain management at last clinic visit and most recent orthopedics note states plan for the same. He has not had this appointment scheduled yet.   Past Medical History:  Diagnosis Date  . ADHD (attention deficit hyperactivity disorder)   . Anxiety   . Arthritis    knee and back  . Chronic back pain   . Depression   . GERD (gastroesophageal reflux disease)   . Hypertension   . Neuropathy   . Obesity   . PONV (postoperative nausea and vomiting)    Review of Systems:  Performed and negative except as otherwise indicated.  Physical Exam:  Vitals:   10/29/17 0936  BP: (!) 150/109  Pulse: 63  Temp: 97.6 F (36.4 C)  TempSrc: Oral  SpO2: 100%  Weight: (!) 392 lb 12.8 oz (178.2 kg)  Height: 5\' 11"  (1.803 m)   Physical Exam  Constitutional: He appears well-developed.  Obese Male  Cardiovascular: Normal rate, regular rhythm and normal heart sounds.  Pulmonary/Chest: Effort normal  and breath sounds normal. No respiratory distress.  Abdominal: Soft. Bowel sounds are normal. He exhibits no distension. There is no tenderness.  Musculoskeletal:  Tenderness to palpation of lumber spine and paraspinal musculature    Assessment & Plan:   See Encounters Tab for problem based charting.  Patient seen with Dr. Heide SparkNarendra

## 2017-10-29 NOTE — Assessment & Plan Note (Signed)
Patient with a history of GERD. Reports increase in symptoms. States he has been on acid blocking therapy in the past but not for several years. Will give trial of PPI. - Omeprazole 20mg  Daily

## 2017-10-29 NOTE — Progress Notes (Signed)
Internal Medicine Clinic Attending  I saw and evaluated the patient.  I personally confirmed the key portions of the history and exam documented by Dr. Melvin and I reviewed pertinent patient test results.  The assessment, diagnosis, and plan were formulated together and I agree with the documentation in the resident's note.  

## 2017-10-29 NOTE — Assessment & Plan Note (Addendum)
Patient has a history of chronic back pain with radicular symptoms 2/2 his lumbar spinal stenosis. He is followed by orthopedics. His back pain has been worse for the past 1 week. Pain is described as the same pain as usually pain, but worsened to a constant 10/10. He denies any inciting event, he just noticed that it was worse starting about 1 week ago. Of note, he also ran out of his pain medication 1 week ago, but states that his pain is worse than his baseline without pain medication.  He has collapsed several times due to associated pain and possible weakness in his legs.  He has called his orthopedist and has a visit scheduled in the next 2 weeks. He is currently taking Celebrex and gabapentin for his radicular symptoms. He additionally takes flexeril, but is out of this medication. Patient was referred to pain management at last clinic visit and most recent orthopedics note states plan for the same. He has not had this appointment scheduled yet. Lumbar spine and paraspinal musculature is tender to palpation on exam, but he states this is typical for him.  Patient will benefit from PT, referral given. Patient will be provided with pain medication to last him until he can see the pain specialist who will manage his pain going forward.  Patient to see his orthopedic surgeon. Will provide refill of flexeril as he has run out and this should help alleviate some of his pain. (ADDENDUM: [place] prescriber database was reviewed and was appropriate.) - Hydrocodone 5-325, q12h PRN Severe Pain, 21 days - Refill Flexeril 10mg  TID PRN - PT referral - Follow up with pain management - Follow up with ortho

## 2017-10-30 NOTE — Telephone Encounter (Signed)
Referral entered to send patient to another orthopedic doctor.

## 2017-10-30 NOTE — Telephone Encounter (Signed)
Pt was seen

## 2017-11-03 ENCOUNTER — Ambulatory Visit: Payer: Medicare Other

## 2017-11-05 DIAGNOSIS — F909 Attention-deficit hyperactivity disorder, unspecified type: Secondary | ICD-10-CM | POA: Diagnosis not present

## 2017-11-05 DIAGNOSIS — F331 Major depressive disorder, recurrent, moderate: Secondary | ICD-10-CM | POA: Diagnosis not present

## 2017-11-05 DIAGNOSIS — F410 Panic disorder [episodic paroxysmal anxiety] without agoraphobia: Secondary | ICD-10-CM | POA: Diagnosis not present

## 2017-11-05 NOTE — Telephone Encounter (Signed)
ERROR

## 2017-11-07 ENCOUNTER — Ambulatory Visit (INDEPENDENT_AMBULATORY_CARE_PROVIDER_SITE_OTHER): Payer: Medicare Other | Admitting: Internal Medicine

## 2017-11-07 ENCOUNTER — Encounter: Payer: Self-pay | Admitting: Internal Medicine

## 2017-11-07 ENCOUNTER — Ambulatory Visit (INDEPENDENT_AMBULATORY_CARE_PROVIDER_SITE_OTHER): Payer: Medicare Other | Admitting: Specialist

## 2017-11-07 ENCOUNTER — Other Ambulatory Visit: Payer: Self-pay

## 2017-11-07 ENCOUNTER — Encounter (INDEPENDENT_AMBULATORY_CARE_PROVIDER_SITE_OTHER): Payer: Self-pay

## 2017-11-07 VITALS — BP 129/85 | HR 67 | Temp 97.5°F | Ht 71.0 in | Wt 396.6 lb

## 2017-11-07 DIAGNOSIS — G8929 Other chronic pain: Secondary | ICD-10-CM

## 2017-11-07 DIAGNOSIS — Z22322 Carrier or suspected carrier of Methicillin resistant Staphylococcus aureus: Secondary | ICD-10-CM | POA: Diagnosis not present

## 2017-11-07 DIAGNOSIS — R21 Rash and other nonspecific skin eruption: Secondary | ICD-10-CM | POA: Insufficient documentation

## 2017-11-07 DIAGNOSIS — Z79891 Long term (current) use of opiate analgesic: Secondary | ICD-10-CM | POA: Diagnosis not present

## 2017-11-07 DIAGNOSIS — R2231 Localized swelling, mass and lump, right upper limb: Secondary | ICD-10-CM

## 2017-11-07 DIAGNOSIS — M25561 Pain in right knee: Secondary | ICD-10-CM

## 2017-11-07 DIAGNOSIS — I10 Essential (primary) hypertension: Secondary | ICD-10-CM

## 2017-11-07 DIAGNOSIS — G8911 Acute pain due to trauma: Secondary | ICD-10-CM | POA: Diagnosis not present

## 2017-11-07 DIAGNOSIS — F17210 Nicotine dependence, cigarettes, uncomplicated: Secondary | ICD-10-CM

## 2017-11-07 DIAGNOSIS — M48062 Spinal stenosis, lumbar region with neurogenic claudication: Secondary | ICD-10-CM | POA: Diagnosis present

## 2017-11-07 DIAGNOSIS — W19XXXA Unspecified fall, initial encounter: Secondary | ICD-10-CM

## 2017-11-07 MED ORDER — HYDROCODONE-ACETAMINOPHEN 10-325 MG PO TABS: 1.0000 | ORAL_TABLET | Freq: Two times a day (BID) | ORAL | 0 refills | Status: AC | PRN

## 2017-11-07 NOTE — Patient Instructions (Signed)
It is very important you follow up with your planned physical therapy, neurology, and pain medicine appointments later this month. This will allow better recommendations on any non-opioid pain treatment options. PT is also very important hopefully to reduce your rate of falling in the future.  I prescribed a stepwise increase dose of medication so you will not need to double up and run out before seeing your specialists later this month. You would need to discuss a long term change with Dr. Alinda MoneyMelvin or your pain doctors.

## 2017-11-07 NOTE — Progress Notes (Signed)
CC: Left leg pain  HPI:  [name] is a 41 y.o. male with PMHx detailed below presenting with continued severe right leg pain with fall on Monday.  See problem based assessment and plan below for additional details.  Spinal stenosis, lumbar region, with neurogenic claudication He has been taking the prescribed hydrocodone 5-325mg  2 pills at a time because he did not notice a significant effect at the 5mg  dose. At the 10mg  dose taken during the day he says it improves his pain from a 10 to 6-7 and allows him to accomplish more during the day afterwards. He is also taking it as 10mg  at night so that his back pain does not prevent him from falling asleep which it otherwise dose. When he has previously been on opioid pain medication he was taking 15mg  at a time oxycodone as his dose. Because of using this higher dose he has run out of medication earlier than discussed at his visit with Dr. Alinda MoneyMelvin. His pain seems to be multifactorial with a component of low back pain and radiation but also some intrinsic disease in the right knee. He has numerous upcoming specialist evaluations for this symptoms including PT on 12/19, Neurology for nerve conduction study on 12/21, pain management clinic on 12/31, and follow up with his orthopedist in January. Assessment Chronic pain currently uncontrolled due to lack of pain medicine. His self-adjustment of dose is not too unreasonable since he likely has some tolerance and is 180kg so 5mg  may not have been efficacious. Considering the amount of additional treatment modalities he is pursuing over the next month I will continue his reported moderately effective dose through December at this time. Plan Hydrocodone-acetaminophen 10-325mg  q12hrs PRN #40 tablets F/U with specialists and [facility]  Fall He fell on Monday and landed on his buttocks without apparent injury. This seems to have been more related to walking on ice than from  weakness in his legs. Strength is at least 4+/5 today and even that may be more limited by pain than true weakness. He is already referred and going to see PT on 12/19.  Skin rash He has a small erythematous nodule on the right hand near the 5th MCP joint. He is concerned this is MRSA and says he has needed previous skin infections drained and was told he carries MRSA in his system. He noticed it since earlier this week. It is currently nontender and not warm but about 1cm diameter area of erythema without fluctuance. Assessment This looks like a small superficial cellulitis/folliculitis on the back of the hand. He is immunocompetent and I think he will not need systemic antibiotics for this. It certainly does not need drainage at this time. Plan I recommended he call back next week if it starts to become much larger or more painful.   Past Medical History:  Diagnosis Date  . ADHD (attention deficit hyperactivity disorder)   . Anxiety   . Arthritis    knee and back  . Chronic back pain   . Depression   . GERD (gastroesophageal reflux disease)   . Hypertension   . Neuropathy   . Obesity   . PONV (postoperative nausea and vomiting)     Review of Systems: Review of Systems  Constitutional: Negative for chills and fever.  Gastrointestinal: Negative for constipation.  Musculoskeletal: Positive for back pain, falls and joint pain. Negative for myalgias.  Skin: Positive for rash.  Neurological: Negative for dizziness.  Physical Exam: Vitals:   11/07/17 1526  BP: 129/85  Pulse: 67  Temp: (!) 97.5 F (36.4 C)  TempSrc: Oral  SpO2: 100%  Weight: (!) 396 lb 9.6 oz (179.9 kg)  Height: 5\' 11"  (1.803 m)   GENERAL- Uncomfortable appearing, no acute distress CARDIAC- RRR, no murmurs, rubs or gallops. RESP- CTAB, no wheezes or crackles. NEURO- Sensation intact throughout RLE, foot and knee only 4+/5 in extension/dorsiflexion EXTREMITIES- Trace lower extremity edema, R knee with  audible popping flexing past 90 degrees, no erythema or effusion of the joint Small erythematous nodule on dorsum of right hand near 5th MCP joint SKIN- Numerous tattoos   Assessment & Plan:   See encounters tab for problem based medical decision making.   Patient discussed with Dr. Cyndie ChimeGranfortuna

## 2017-11-10 ENCOUNTER — Encounter (INDEPENDENT_AMBULATORY_CARE_PROVIDER_SITE_OTHER): Payer: Self-pay | Admitting: *Deleted

## 2017-11-10 NOTE — Assessment & Plan Note (Signed)
He has been taking the prescribed hydrocodone 5-325mg  2 pills at a time because he did not notice a significant effect at the 5mg  dose. At the 10mg  dose taken during the day he says it improves his pain from a 10 to 6-7 and allows him to accomplish more during the day afterwards. He is also taking it as 10mg  at night so that his back pain does not prevent him from falling asleep which it otherwise dose. When he has previously been on opioid pain medication he was taking 15mg  at a time oxycodone as his dose. Because of using this higher dose he has run out of medication earlier than discussed at his visit with Dr. Alinda MoneyMelvin. His pain seems to be multifactorial with a component of low back pain and radiation but also some intrinsic disease in the right knee. He has numerous upcoming specialist evaluations for this symptoms including PT on 12/19, Neurology for nerve conduction study on 12/21, pain management clinic on 12/31, and follow up with his orthopedist in January. Assessment Chronic pain currently uncontrolled due to lack of pain medicine. His self-adjustment of dose is not too unreasonable since he likely has some tolerance and is 180kg so 5mg  may not have been efficacious. Considering the amount of additional treatment modalities he is pursuing over the next month I will continue his reported moderately effective dose through December at this time. Plan Hydrocodone-acetaminophen 10-325mg  q12hrs PRN #40 tablets F/U with specialists and [facility]

## 2017-11-10 NOTE — Progress Notes (Signed)
Medicine attending: Medical history, presenting problems, physical findings, and medications, reviewed with resident physician Dr Christopher Rice on the day of the patient visit and I concur with his evaluation and management plan. 

## 2017-11-10 NOTE — Assessment & Plan Note (Signed)
He has a small erythematous nodule on the right hand near the 5th MCP joint. He is concerned this is MRSA and says he has needed previous skin infections drained and was told he carries MRSA in his system. He noticed it since earlier this week. It is currently nontender and not warm but about 1cm diameter area of erythema without fluctuance. Assessment This looks like a small superficial cellulitis/folliculitis on the back of the hand. He is immunocompetent and I think he will not need systemic antibiotics for this. It certainly does not need drainage at this time. Plan I recommended he call back next week if it starts to become much larger or more painful.

## 2017-11-10 NOTE — Assessment & Plan Note (Addendum)
He fell on Monday and landed on his buttocks without apparent injury. This seems to have been more related to walking on ice than from weakness in his legs. Strength is at least 4+/5 today and even that may be more limited by pain than true weakness. He is already referred and going to see PT on 12/19.

## 2017-11-12 ENCOUNTER — Ambulatory Visit: Payer: Medicare Other | Attending: Internal Medicine | Admitting: Physical Therapy

## 2017-11-12 NOTE — Addendum Note (Signed)
Addended by: Neomia DearPOWERS, Kaci Freel E on: 11/12/2017 07:26 PM   Modules accepted: Orders

## 2017-11-14 ENCOUNTER — Encounter: Payer: Self-pay | Admitting: Internal Medicine

## 2017-11-14 ENCOUNTER — Ambulatory Visit (INDEPENDENT_AMBULATORY_CARE_PROVIDER_SITE_OTHER): Payer: Medicare Other | Admitting: Diagnostic Neuroimaging

## 2017-11-14 ENCOUNTER — Telehealth: Payer: Self-pay | Admitting: Internal Medicine

## 2017-11-14 ENCOUNTER — Encounter: Payer: Self-pay | Admitting: Diagnostic Neuroimaging

## 2017-11-14 ENCOUNTER — Ambulatory Visit: Payer: Medicare Other

## 2017-11-14 VITALS — BP 125/80 | HR 74 | Ht 71.0 in | Wt 396.2 lb

## 2017-11-14 DIAGNOSIS — M5416 Radiculopathy, lumbar region: Secondary | ICD-10-CM

## 2017-11-14 DIAGNOSIS — G5601 Carpal tunnel syndrome, right upper limb: Secondary | ICD-10-CM | POA: Diagnosis not present

## 2017-11-14 NOTE — Telephone Encounter (Signed)
NEEDS DIFFERENT MEDICATION FOR PAIN, HIS CURRENT MED IS MAKING HIS STOMACH HURT

## 2017-11-14 NOTE — Progress Notes (Signed)
[facility]  PATIENT: [name] DOB: 03-01-1976  REFERRING CLINICIAN: Otelia SergeantNitka HISTORY FROM: patient  REASON FOR VISIT: new consult    HISTORICAL  CHIEF COMPLAINT:  Chief Complaint  Patient presents with  . NP  Nitka  . Numbness and tingling    I month R extremities weakness and pain, L extremities since back surgery one yr ago. Has Pain Management appt on the 11/24/2017    HISTORY OF PRESENT ILLNESS:   41 year old male here for evaluation of lower extremity numbness, pain, low back pain, neck pain, knee pain.  Patient has chronic low back pain, and presented here in May 2017 for EMG nerve conduction study raising possibility of lumbar radiculopathies.  Patient followed up in orthopedic clinic, had CT of the lumbar spine and was treated with lumbar decompression and fusion.  Unfortunately symptoms continued.  Follow-up MRI lumbar spine showed some continued contact upon left L5 nerve root.  Patient also has  Right hand numbness and tingling, had EMG nerve conduction which showed right carpal tunnel syndrome.  Patient has had left carpal tunnel syndrome release surgery in the past.    REVIEW OF SYSTEMS: Full 14 system review of systems performed and negative with exception of: Joint pain aching muscles cramps.  ALLERGIES: Allergies  Allergen Reactions  . Tramadol Nausea And Vomiting  . Vicodin [Hydrocodone-Acetaminophen] Nausea And Vomiting    HOME MEDICATIONS: Outpatient Medications Prior to Visit  Medication Sig Dispense Refill  . acetaminophen (TYLENOL) 500 MG tablet Take 1,000 mg by mouth every 6 (six) hours as needed for moderate pain.    Marland Kitchen. amphetamine-dextroamphetamine (ADDERALL) 30 MG tablet Take 1 tablet 2 (two) times daily by mouth.  0  . celecoxib (CELEBREX) 200 MG capsule Take 1 capsule (200 mg total) by mouth 2 (two) times daily. 60 capsule 3  . clonazePAM (KLONOPIN) 0.5 MG tablet Take 1 TABLET BY MOUTH at 8 IN THE MORNING and at 8 pm   2  . cyclobenzaprine (FLEXERIL) 10 MG tablet Take 1 tablet (10 mg total) by mouth 3 (three) times daily as needed for muscle spasms. 90 tablet 3  . diclofenac sodium (VOLTAREN) 1 % GEL Apply 4 g topically 4 (four) times daily. 100 g 0  . gabapentin (NEURONTIN) 800 MG tablet Take 1 tablet (800 mg total) by mouth 2 (two) times daily. 60 tablet 3  . gemfibrozil (LOPID) 600 MG tablet Take 600 mg by mouth 2 (two) times daily.  5  . HYDROcodone-acetaminophen (NORCO) 10-325 MG tablet Take 1 tablet by mouth every 12 (twelve) hours as needed for up to 17 days for severe pain. 40 tablet 0  . ibuprofen (ADVIL,MOTRIN) 200 MG tablet Take 600 mg by mouth.    . metoprolol succinate (TOPROL-XL) 25 MG 24 hr tablet Take 1 tablet (25 mg total) by mouth 2 (two) times daily. Take 1 tablet BID for hypertention 60 tablet 3  . omeprazole (PRILOSEC) 20 MG capsule Take 1 capsule (20 mg total) by mouth daily. 30 capsule 2   No facility-administered medications prior to visit.     PAST MEDICAL HISTORY: Past Medical History:  Diagnosis Date  . ADHD (attention deficit hyperactivity disorder)   . Anxiety   . Arthritis    knee and back  . Chronic back pain   . Depression   . GERD (gastroesophageal reflux disease)   . Hypertension   . Neuropathy   . Obesity   . PONV (postoperative nausea and vomiting)     PAST  SURGICAL HISTORY: Past Surgical History:  Procedure Laterality Date  . BACK SURGERY    . CARPAL TUNNEL RELEASE Left   . HAND SURGERY    . KNEE SURGERY Right   . TONSILLECTOMY      FAMILY HISTORY: Family History  Problem Relation Age of Onset  . Heart failure Mother   . Heart disease Mother   . Diabetes Father   . Stroke Brother     SOCIAL HISTORY:  Social History   Socioeconomic History  . Marital status: Single    Spouse name: Not on file  . Number of children: Not on file  . Years of education: Not on file  . Highest education level: Not on file  Social Needs  . Financial resource  strain: Not on file  . Food insecurity - worry: Not on file  . Food insecurity - inability: Not on file  . Transportation needs - medical: Not on file  . Transportation needs - non-medical: Not on file  Occupational History  . Not on file  Tobacco Use  . Smoking status: Current Every Day Smoker    Packs/day: 0.50    Years: 18.00    Pack years: 9.00    Types: Cigarettes  . Smokeless tobacco: Never Used  Substance and Sexual Activity  . Alcohol use: No    Comment: quit 2009  . Drug use: No  . Sexual activity: Not on file  Other Topics Concern  . Not on file  Social History Narrative  . Not on file     PHYSICAL EXAM  GENERAL EXAM/CONSTITUTIONAL: Vitals:  Vitals:   11/14/17 0801  BP: 125/80  Pulse: 74  Weight: (!) 396 lb 3.2 oz (179.7 kg)  Height: 5\' 11"  (1.803 m)     Body mass index is 55.26 kg/m.  Visual Acuity Screening   Right eye Left eye Both eyes  Without correction: 20/40 20/70   With correction:        Patient is in MODERATE DISTRESS  UNKEMPT HAND HYGEINE  well developed, nourished  neck is supple  CARDIOVASCULAR:  Examination of carotid arteries is normal; no carotid bruits  Regular rate and rhythm, no murmurs  Examination of peripheral vascular system by observation and palpation is normal  EYES:  Ophthalmoscopic exam of optic discs and posterior segments is normal; no papilledema or hemorrhages  MUSCULOSKELETAL:  Gait, strength, tone, movements noted in Neurologic exam below  NEUROLOGIC: MENTAL STATUS:  No flowsheet data found.  awake, alert, oriented to person, place and time  recent and remote memory intact  normal attention and concentration  language fluent, comprehension intact, naming intact,   fund of knowledge appropriate  CRANIAL NERVE:   2nd - no papilledema on fundoscopic exam  2nd, 3rd, 4th, 6th - pupils equal and reactive to light, visual fields full to confrontation, extraocular muscles intact, no  nystagmus  5th - facial sensation symmetric  7th - facial strength symmetric  8th - hearing intact  9th - palate elevates symmetrically, uvula midline  11th - shoulder shrug symmetric  12th - tongue protrusion midline  MOTOR:   normal bulk and tone, full strength in the BUE, BLE  LIMITED BY PAIN  SENSORY:   normal and symmetric to light touch  RIGHT TOE VIB 7 SEC; LEFT TOE VIB 9 SEC  COORDINATION:   finger-nose-finger, fine finger movements normal  REFLEXES:   deep tendon reflexes TRACE and symmetric  GAIT/STATION:   ANTALGIC GAIT  SLOW MOVING    DIAGNOSTIC  DATA (LABS, IMAGING, TESTING) - I reviewed patient records, labs, notes, testing and imaging myself where available.  Lab Results  Component Value Date   WBC 7.9 08/31/2016   HGB 13.6 08/31/2016   HCT 39.7 08/31/2016   MCV 90.8 08/31/2016   PLT 246 08/31/2016      Component Value Date/Time   NA 139 10/06/2017 1654   K 5.0 10/06/2017 1654   CL 100 10/06/2017 1654   CO2 24 10/06/2017 1654   GLUCOSE 87 10/06/2017 1654   GLUCOSE 110 (H) 08/31/2016 1826   BUN 18 10/06/2017 1654   CREATININE 1.08 10/06/2017 1654   CREATININE 0.85 03/09/2014 1717   CALCIUM 9.0 10/06/2017 1654   PROT 6.8 08/31/2016 1826   ALBUMIN 3.7 08/31/2016 1826   AST 19 08/31/2016 1826   ALT 18 08/31/2016 1826   ALKPHOS 81 08/31/2016 1826   BILITOT 0.2 (L) 08/31/2016 1826   GFRNONAA 85 10/06/2017 1654   GFRNONAA >89 03/09/2014 1717   GFRAA 98 10/06/2017 1654   GFRAA >89 03/09/2014 1717   Lab Results  Component Value Date   CHOL 125 03/09/2014   HDL 29 (L) 03/09/2014   LDLCALC 74 03/09/2014   TRIG 111 03/09/2014   CHOLHDL 4.3 03/09/2014   Lab Results  Component Value Date   HGBA1C 5.5 12/21/2012   Lab Results  Component Value Date   VITAMINB12 1,127 (H) 12/21/2012   Lab Results  Component Value Date   TSH 1.029 03/09/2014    12/19/16 EMG/NCS: - Left peroneal motor response and left tibialis anterior  needle EMG abnormalities raise possibility of left L5 radiculopathy. There are active and chronic denervation changes in the left tibialis anterior muscle.   04/17/16 EMG/NCS: 1. Right median neuropathy at the wrist consistent with right carpal tunnel syndrome. 2. No definite evidence of right cervical radiculopathy at this time. 3. Mild evidence of bilateral lumbar radiculopathies (L5-S1).  02/26/17 MRI lumbar spine 1. No acute osseous abnormality or abnormal enhancement of the spine. 2. L3 through L5 posterior/interbody instrumented fusion and laminectomy. 3. Lumbar degenerative changes are greatest at the L4-5 level where posterior marginal osteophytes and facet hypertrophy result in contact on the descending left L5 nerve root in the lateral recess and mild bilateral foraminal narrowing. 4. No high-grade canal stenosis.  08/31/16 CT lumbar spine 1. Status post L3-L5 posterior spinal fusion with small fluid collection in the posterior midline paraspinal soft tissues, measuring up to 3.8 x 3.6 cm in greatest transverse dimensions. This may be a postoperative seroma, but an infected collection may have an identical appearance. 2. Unchanged moderate right L4-5 and left L3-4 neural foraminal stenosis. 3. No spinal canal stenosis.     ASSESSMENT AND PLAN  41 y.o. year old male here with chronic neck pain, low back pain, right carpal tunnel syndrome, lumbar radiculopathies, obesity.  Chronic pain likely due to complication of chronic obesity state and biomechanical stress.  Dx:  1. Lumbar radiculopathy   2. Right carpal tunnel syndrome      PLAN:  I spent 15 minutes of face to face time with patient. Greater than 50% of time was spent in counseling and coordination of care with patient. In summary we discussed:   - continue conservative mgmt with nutrition, weight loss, exercises, pain mgmt - consider right carpal tunnel release surgery per Dr. Otelia Sergeant ortho clinic or hand  surgery clinic  Return if symptoms worsen or fail to improve, for return to PCP and Dr. Otelia Sergeant.    Shameria Trimarco R.  Shadee Rathod, MD 11/14/2017, 8:45 AM Certified in Neurology, Neurophysiology and Neuroimaging  [facility] [address], [address] [place], [place] [address] [phone]

## 2017-11-21 DIAGNOSIS — F909 Attention-deficit hyperactivity disorder, unspecified type: Secondary | ICD-10-CM | POA: Diagnosis not present

## 2017-11-21 DIAGNOSIS — F331 Major depressive disorder, recurrent, moderate: Secondary | ICD-10-CM | POA: Diagnosis not present

## 2017-11-21 DIAGNOSIS — F410 Panic disorder [episodic paroxysmal anxiety] without agoraphobia: Secondary | ICD-10-CM | POA: Diagnosis not present

## 2017-11-23 ENCOUNTER — Encounter (HOSPITAL_COMMUNITY): Payer: Self-pay | Admitting: Emergency Medicine

## 2017-11-23 ENCOUNTER — Other Ambulatory Visit: Payer: Self-pay

## 2017-11-23 ENCOUNTER — Ambulatory Visit (HOSPITAL_COMMUNITY)
Admission: EM | Admit: 2017-11-23 | Discharge: 2017-11-23 | Disposition: A | Discharge status: Home / Self Care | Payer: Medicare Other | Attending: Internal Medicine | Admitting: Internal Medicine

## 2017-11-23 DIAGNOSIS — J0140 Acute pansinusitis, unspecified: Secondary | ICD-10-CM | POA: Diagnosis not present

## 2017-11-23 MED ORDER — KETOROLAC TROMETHAMINE 30 MG/ML IJ SOLN
30.0000 mg | Freq: Once | INTRAMUSCULAR | Status: AC
  Administered 2017-11-23: 30 mg via INTRAMUSCULAR

## 2017-11-23 MED ORDER — AMOXICILLIN-POT CLAVULANATE 875-125 MG PO TABS: 1.0000 | ORAL_TABLET | Freq: Two times a day (BID) | ORAL | 0 refills | Status: DC

## 2017-11-23 MED ORDER — FLUTICASONE PROPIONATE 50 MCG/ACT NA SUSP: 2.0000 | Freq: Every day | NASAL | 0 refills | Status: DC

## 2017-11-23 MED ORDER — KETOROLAC TROMETHAMINE 30 MG/ML IJ SOLN
INTRAMUSCULAR | Status: AC
  Filled 2017-11-23: qty 1

## 2017-11-23 MED ORDER — CETIRIZINE HCL 10 MG PO TABS: 10.0000 mg | ORAL_TABLET | Freq: Every day | ORAL | 0 refills | Status: DC

## 2017-11-23 NOTE — ED Provider Notes (Signed)
[facility]    CSN: [id number] Arrival date & time: 11/23/17  1206     History   Chief Complaint Chief Complaint  Patient presents with  . Cough    HPI [name] is a 41 y.o. male.   41 year old male comes in for 1-2-week history of URI symptoms.  Has had nonproductive cough, right facial pain, rhinorrhea, nasal congestion. Denies fever, chills, night sweats. Denies chest pain, wheezing. States has had some shortness of breath with passive smoking environment. otc mucinex, cold medications without relief. Current everyday smoker, 0.5ppd smoker, since 41 years old.       Past Medical History:  Diagnosis Date  . ADHD (attention deficit hyperactivity disorder)   . Anxiety   . Arthritis    knee and back  . Chronic back pain   . Depression   . GERD (gastroesophageal reflux disease)   . Hypertension   . Neuropathy   . Obesity   . PONV (postoperative nausea and vomiting)     Patient Active Problem List   Diagnosis Date Noted  . Fall 11/07/2017  . Skin rash 11/07/2017  . Spinal stenosis, lumbar region, with neurogenic claudication 08/06/2016    Class: Chronic  . DDD (degenerative disc disease), lumbar 08/06/2016    Class: Chronic  . Lumbar disc herniation with radiculopathy 08/06/2016    Class: Chronic  . Neurogenic claudication due to lumbar spinal stenosis 08/06/2016  . Vitamin D deficiency 06/30/2014  . Right knee pain 06/30/2014  . Essential hypertension, benign 03/09/2014  . Anxiety and depression 03/09/2014  . Neuropathic pain 03/09/2014  . Smoking 03/09/2014  . Hemorrhoid 03/09/2014  . GERD (gastroesophageal reflux disease) 03/09/2014    Past Surgical History:  Procedure Laterality Date  . BACK SURGERY    . CARPAL TUNNEL RELEASE Left   . HAND SURGERY    . KNEE SURGERY Right   . TONSILLECTOMY         Home Medications    Prior to Admission medications   Medication Sig Start Date End Date Taking? Authorizing Provider    acetaminophen (TYLENOL) 500 MG tablet Take 1,000 mg by mouth every 6 (six) hours as needed for moderate pain.    [provider]  amoxicillin-clavulanate (AUGMENTIN) 875-125 MG tablet Take 1 tablet by mouth every 12 (twelve) hours. 11/23/17   Cathie HoopsYu, Amy V, PA-C  amphetamine-dextroamphetamine (ADDERALL) 30 MG tablet Take 1 tablet 2 (two) times daily by mouth. 09/13/17   [provider]  celecoxib (CELEBREX) 200 MG capsule Take 1 capsule (200 mg total) by mouth 2 (two) times daily. 09/01/17   Kerrin ChampagneNitka, James E, MD  cetirizine (ZYRTEC) 10 MG tablet Take 1 tablet (10 mg total) by mouth daily. 11/23/17   Cathie HoopsYu, Amy V, PA-C  clonazePAM (KLONOPIN) 0.5 MG tablet Take 1 TABLET BY MOUTH at 8 IN THE MORNING and at 8 pm 09/10/17   [provider]  cyclobenzaprine (FLEXERIL) 10 MG tablet Take 1 tablet (10 mg total) by mouth 3 (three) times daily as needed for muscle spasms. 10/29/17   Beola CordMelvin, Alexander, MD  diclofenac sodium (VOLTAREN) 1 % GEL Apply 4 g topically 4 (four) times daily. 09/17/17   Joy, Shawn C, PA-C  fluticasone (FLONASE) 50 MCG/ACT nasal spray Place 2 sprays into both nostrils daily. 11/23/17   Cathie HoopsYu, Amy V, PA-C  gabapentin (NEURONTIN) 800 MG tablet Take 1 tablet (800 mg total) by mouth 2 (two) times daily. 09/01/17   Kerrin ChampagneNitka, James E, MD  gemfibrozil (LOPID) 600  MG tablet Take 600 mg by mouth 2 (two) times daily. 05/16/15   [provider]  HYDROcodone-acetaminophen (NORCO) 10-325 MG tablet Take 1 tablet by mouth every 12 (twelve) hours as needed for up to 17 days for severe pain. 11/07/17 11/24/17  Fuller Plan, MD  ibuprofen (ADVIL,MOTRIN) 200 MG tablet Take 600 mg by mouth.    [provider]  metoprolol succinate (TOPROL-XL) 25 MG 24 hr tablet Take 1 tablet (25 mg total) by mouth 2 (two) times daily. Take 1 tablet BID for hypertention 09/01/17   Kerrin Champagne, MD  omeprazole (PRILOSEC) 20 MG capsule Take 1 capsule (20 mg total) by mouth daily. 10/29/17 01/27/18   Beola Cord, MD    Family History Family History  Problem Relation Age of Onset  . Heart failure Mother   . Heart disease Mother   . Diabetes Father   . Stroke Brother     Social History Social History   Tobacco Use  . Smoking status: Current Every Day Smoker    Packs/day: 0.50    Years: 18.00    Pack years: 9.00    Types: Cigarettes  . Smokeless tobacco: Never Used  Substance Use Topics  . Alcohol use: No    Comment: quit 2009  . Drug use: No     Allergies   Tramadol and Vicodin [hydrocodone-acetaminophen]   Review of Systems Review of Systems  Reason unable to perform ROS: See HPI as above.     Physical Exam Triage Vital Signs ED Triage Vitals  Enc Vitals Group     BP 11/23/17 1304 (!) 140/101     Pulse Rate 11/23/17 1304 80     Resp 11/23/17 1304 18     Temp 11/23/17 1304 98.1 F (36.7 C)     Temp Source 11/23/17 1304 Oral     SpO2 11/23/17 1305 95 %     Weight --      Height --      Head Circumference --      Peak Flow --      Pain Score 11/23/17 1304 8     Pain Loc --      Pain Edu? --      Excl. in GC? --    No data found.  Updated Vital Signs BP (!) 140/101 (BP Location: Left Arm) Comment: Notified Kim  Pulse 80   Temp 98.1 F (36.7 C) (Oral)   Resp 18   SpO2 95%   Physical Exam  Constitutional: He is oriented to person, place, and time. He appears well-developed and well-nourished. No distress.  HENT:  Head: Normocephalic and atraumatic.  Right Ear: External ear and ear canal normal. Tympanic membrane is erythematous. Tympanic membrane is not bulging.  Left Ear: Tympanic membrane, external ear and ear canal normal. Tympanic membrane is not erythematous and not bulging.  Nose: Mucosal edema and rhinorrhea present. Right sinus exhibits maxillary sinus tenderness and frontal sinus tenderness. Left sinus exhibits maxillary sinus tenderness and frontal sinus tenderness.  Mouth/Throat: Uvula is midline, oropharynx is clear and moist  and mucous membranes are normal.  Eyes: Conjunctivae and EOM are normal. Pupils are equal, round, and reactive to light.  Neck: Normal range of motion. Neck supple.  Cardiovascular: Normal rate, regular rhythm and normal heart sounds. Exam reveals no gallop and no friction rub.  No murmur heard. Pulmonary/Chest: Effort normal and breath sounds normal. He has no decreased breath sounds. He has no wheezes. He has no rhonchi. He  has no rales.  Lymphadenopathy:    He has no cervical adenopathy.  Neurological: He is alert and oriented to person, place, and time.  Skin: Skin is warm and dry.  Psychiatric: He has a normal mood and affect. His behavior is normal. Judgment normal.     UC Treatments / Results  Labs (all labs ordered are listed, but only abnormal results are displayed) Labs Reviewed - No data to display  EKG  EKG Interpretation None       Radiology No results found.  Procedures Procedures (including critical care time)  Medications Ordered in UC Medications  ketorolac (TORADOL) 30 MG/ML injection 30 mg (30 mg Intramuscular Given 11/23/17 1356)     Initial Impression / Assessment and Plan / UC Course  I have reviewed the triage vital signs and the nursing notes.  Pertinent labs & imaging results that were available during my care of the patient were reviewed by me and considered in my medical decision making (see chart for details).    Augmentin for sinusitis.  Other symptomatic treatment discussed.  Patient requested pain medications to help with body pain from coughing, prior to injection given in office.  Return precautions given.  Patient expresses understanding and agrees to plan.  Final Clinical Impressions(s) / UC Diagnoses   Final diagnoses:  Acute non-recurrent pansinusitis    ED Discharge Orders        Ordered    amoxicillin-clavulanate (AUGMENTIN) 875-125 MG tablet  Every 12 hours     11/23/17 1352    fluticasone (FLONASE) 50 MCG/ACT nasal spray   Daily     11/23/17 1352    cetirizine (ZYRTEC) 10 MG tablet  Daily     11/23/17 1352       Belinda FisherYu, Amy V, PA-C 11/23/17 1401

## 2017-11-23 NOTE — ED Triage Notes (Signed)
Onset of symptoms was 1 - 2 weeks ago.  Symptoms are coughing, right facial pain, general aches.

## 2017-11-23 NOTE — Discharge Instructions (Signed)
Start augmentin for sinusitis. Start flonase, zyrtec for nasal congestion. You can use over the counter nasal saline rinse such as neti pot for nasal congestion. Keep hydrated, your urine should be clear to pale yellow in color. Tylenol/motrin for fever and pain. Monitor for any worsening of symptoms, chest pain, shortness of breath, wheezing, swelling of the throat, follow up for reevaluation.

## 2017-11-24 ENCOUNTER — Other Ambulatory Visit (INDEPENDENT_AMBULATORY_CARE_PROVIDER_SITE_OTHER): Payer: Self-pay | Admitting: Radiology

## 2017-11-24 DIAGNOSIS — Z79899 Other long term (current) drug therapy: Secondary | ICD-10-CM | POA: Diagnosis not present

## 2017-11-24 DIAGNOSIS — G894 Chronic pain syndrome: Secondary | ICD-10-CM | POA: Diagnosis not present

## 2017-11-24 DIAGNOSIS — M5417 Radiculopathy, lumbosacral region: Secondary | ICD-10-CM | POA: Diagnosis not present

## 2017-11-24 DIAGNOSIS — M170 Bilateral primary osteoarthritis of knee: Secondary | ICD-10-CM | POA: Diagnosis not present

## 2017-11-24 DIAGNOSIS — M961 Postlaminectomy syndrome, not elsewhere classified: Secondary | ICD-10-CM | POA: Diagnosis not present

## 2017-11-24 DIAGNOSIS — Z5181 Encounter for therapeutic drug level monitoring: Secondary | ICD-10-CM | POA: Diagnosis not present

## 2017-11-24 DIAGNOSIS — R200 Anesthesia of skin: Secondary | ICD-10-CM

## 2017-11-24 DIAGNOSIS — R202 Paresthesia of skin: Secondary | ICD-10-CM

## 2017-11-24 DIAGNOSIS — M4320 Fusion of spine, site unspecified: Secondary | ICD-10-CM

## 2017-11-24 MED ORDER — CELECOXIB 200 MG PO CAPS: 200.0000 mg | ORAL_CAPSULE | Freq: Two times a day (BID) | ORAL | 3 refills | Status: DC

## 2017-11-24 MED ORDER — GABAPENTIN 800 MG PO TABS: 800.0000 mg | ORAL_TABLET | Freq: Two times a day (BID) | ORAL | 3 refills | Status: AC

## 2017-11-24 NOTE — Telephone Encounter (Signed)
Pharmacy sent request for refill.

## 2017-12-05 ENCOUNTER — Ambulatory Visit (INDEPENDENT_AMBULATORY_CARE_PROVIDER_SITE_OTHER): Payer: Medicare Other | Admitting: Specialist

## 2017-12-05 ENCOUNTER — Encounter (INDEPENDENT_AMBULATORY_CARE_PROVIDER_SITE_OTHER): Payer: Self-pay

## 2017-12-11 ENCOUNTER — Encounter (INDEPENDENT_AMBULATORY_CARE_PROVIDER_SITE_OTHER): Payer: Self-pay

## 2017-12-11 ENCOUNTER — Ambulatory Visit (INDEPENDENT_AMBULATORY_CARE_PROVIDER_SITE_OTHER): Payer: Medicare Other | Admitting: Surgery

## 2018-01-05 ENCOUNTER — Other Ambulatory Visit: Payer: Self-pay | Admitting: Internal Medicine

## 2018-01-05 DIAGNOSIS — M79604 Pain in right leg: Secondary | ICD-10-CM | POA: Diagnosis not present

## 2018-01-05 DIAGNOSIS — G894 Chronic pain syndrome: Secondary | ICD-10-CM | POA: Diagnosis not present

## 2018-01-05 DIAGNOSIS — M545 Low back pain: Secondary | ICD-10-CM | POA: Diagnosis not present

## 2018-01-05 DIAGNOSIS — G8929 Other chronic pain: Secondary | ICD-10-CM | POA: Diagnosis not present

## 2018-01-05 NOTE — Telephone Encounter (Signed)
Refill Approved

## 2018-01-13 NOTE — Telephone Encounter (Signed)
error 

## 2018-02-02 DIAGNOSIS — M961 Postlaminectomy syndrome, not elsewhere classified: Secondary | ICD-10-CM | POA: Diagnosis not present

## 2018-02-02 DIAGNOSIS — M545 Low back pain: Secondary | ICD-10-CM | POA: Diagnosis not present

## 2018-02-02 DIAGNOSIS — M5417 Radiculopathy, lumbosacral region: Secondary | ICD-10-CM | POA: Diagnosis not present

## 2018-02-02 DIAGNOSIS — M79604 Pain in right leg: Secondary | ICD-10-CM | POA: Diagnosis not present

## 2018-02-18 NOTE — Telephone Encounter (Signed)
ERROR. TRYING TO CLOSE ENCOUNTER.

## 2018-02-23 DIAGNOSIS — F909 Attention-deficit hyperactivity disorder, unspecified type: Secondary | ICD-10-CM | POA: Diagnosis not present

## 2018-02-23 DIAGNOSIS — F331 Major depressive disorder, recurrent, moderate: Secondary | ICD-10-CM | POA: Diagnosis not present

## 2018-02-23 DIAGNOSIS — F410 Panic disorder [episodic paroxysmal anxiety] without agoraphobia: Secondary | ICD-10-CM | POA: Diagnosis not present

## 2018-03-04 DIAGNOSIS — M79604 Pain in right leg: Secondary | ICD-10-CM | POA: Diagnosis not present

## 2018-03-04 DIAGNOSIS — M545 Low back pain: Secondary | ICD-10-CM | POA: Diagnosis not present

## 2018-03-04 DIAGNOSIS — G894 Chronic pain syndrome: Secondary | ICD-10-CM | POA: Diagnosis not present

## 2018-03-04 DIAGNOSIS — M5417 Radiculopathy, lumbosacral region: Secondary | ICD-10-CM | POA: Diagnosis not present

## 2018-03-09 DIAGNOSIS — F909 Attention-deficit hyperactivity disorder, unspecified type: Secondary | ICD-10-CM | POA: Diagnosis not present

## 2018-03-09 DIAGNOSIS — F331 Major depressive disorder, recurrent, moderate: Secondary | ICD-10-CM | POA: Diagnosis not present

## 2018-03-09 DIAGNOSIS — F410 Panic disorder [episodic paroxysmal anxiety] without agoraphobia: Secondary | ICD-10-CM | POA: Diagnosis not present

## 2018-03-26 ENCOUNTER — Ambulatory Visit: Payer: Medicare Other

## 2018-03-26 ENCOUNTER — Encounter: Payer: Self-pay | Admitting: Internal Medicine

## 2018-04-06 ENCOUNTER — Encounter: Payer: Self-pay | Admitting: Internal Medicine

## 2018-04-06 ENCOUNTER — Ambulatory Visit: Payer: Medicare Other

## 2018-04-07 ENCOUNTER — Encounter: Payer: Self-pay | Admitting: Internal Medicine

## 2018-05-02 ENCOUNTER — Other Ambulatory Visit: Payer: Self-pay | Admitting: Internal Medicine

## 2018-05-02 ENCOUNTER — Other Ambulatory Visit (INDEPENDENT_AMBULATORY_CARE_PROVIDER_SITE_OTHER): Payer: Self-pay | Admitting: Specialist

## 2018-05-02 DIAGNOSIS — M4320 Fusion of spine, site unspecified: Secondary | ICD-10-CM

## 2018-05-04 NOTE — Telephone Encounter (Signed)
Dismissal letter sent May 14 - may receive refills until 30 days from this date.

## 2018-05-04 NOTE — Telephone Encounter (Signed)
Celecoxib refill request  

## 2018-05-04 NOTE — Telephone Encounter (Signed)
Refill approved.

## 2018-05-25 DIAGNOSIS — M5417 Radiculopathy, lumbosacral region: Secondary | ICD-10-CM | POA: Diagnosis not present

## 2018-05-25 DIAGNOSIS — G894 Chronic pain syndrome: Secondary | ICD-10-CM | POA: Diagnosis not present

## 2018-05-25 DIAGNOSIS — F909 Attention-deficit hyperactivity disorder, unspecified type: Secondary | ICD-10-CM | POA: Diagnosis not present

## 2018-05-25 DIAGNOSIS — M79604 Pain in right leg: Secondary | ICD-10-CM | POA: Diagnosis not present

## 2018-05-25 DIAGNOSIS — M545 Low back pain: Secondary | ICD-10-CM | POA: Diagnosis not present

## 2018-05-25 DIAGNOSIS — Z5181 Encounter for therapeutic drug level monitoring: Secondary | ICD-10-CM | POA: Diagnosis not present

## 2018-05-25 DIAGNOSIS — F410 Panic disorder [episodic paroxysmal anxiety] without agoraphobia: Secondary | ICD-10-CM | POA: Diagnosis not present

## 2018-05-25 DIAGNOSIS — F331 Major depressive disorder, recurrent, moderate: Secondary | ICD-10-CM | POA: Diagnosis not present

## 2018-05-25 DIAGNOSIS — Z79899 Other long term (current) drug therapy: Secondary | ICD-10-CM | POA: Diagnosis not present

## 2018-06-04 DIAGNOSIS — F410 Panic disorder [episodic paroxysmal anxiety] without agoraphobia: Secondary | ICD-10-CM | POA: Diagnosis not present

## 2018-06-04 DIAGNOSIS — F331 Major depressive disorder, recurrent, moderate: Secondary | ICD-10-CM | POA: Diagnosis not present

## 2018-06-04 DIAGNOSIS — F909 Attention-deficit hyperactivity disorder, unspecified type: Secondary | ICD-10-CM | POA: Diagnosis not present

## 2018-07-02 ENCOUNTER — Other Ambulatory Visit: Payer: Self-pay | Admitting: Internal Medicine

## 2018-08-05 ENCOUNTER — Other Ambulatory Visit: Payer: Self-pay | Admitting: Internal Medicine

## 2018-08-25 DIAGNOSIS — F3181 Bipolar II disorder: Secondary | ICD-10-CM | POA: Diagnosis not present

## 2018-08-25 DIAGNOSIS — F411 Generalized anxiety disorder: Secondary | ICD-10-CM | POA: Diagnosis not present

## 2018-08-25 DIAGNOSIS — F902 Attention-deficit hyperactivity disorder, combined type: Secondary | ICD-10-CM | POA: Diagnosis not present

## 2018-09-01 DIAGNOSIS — M170 Bilateral primary osteoarthritis of knee: Secondary | ICD-10-CM | POA: Diagnosis not present

## 2018-09-01 DIAGNOSIS — G894 Chronic pain syndrome: Secondary | ICD-10-CM | POA: Diagnosis not present

## 2018-09-01 DIAGNOSIS — Z5181 Encounter for therapeutic drug level monitoring: Secondary | ICD-10-CM | POA: Diagnosis not present

## 2018-09-01 DIAGNOSIS — M5417 Radiculopathy, lumbosacral region: Secondary | ICD-10-CM | POA: Diagnosis not present

## 2018-09-01 DIAGNOSIS — Z79899 Other long term (current) drug therapy: Secondary | ICD-10-CM | POA: Diagnosis not present

## 2018-09-01 DIAGNOSIS — M79604 Pain in right leg: Secondary | ICD-10-CM | POA: Diagnosis not present

## 2018-11-30 DIAGNOSIS — G8921 Chronic pain due to trauma: Secondary | ICD-10-CM | POA: Diagnosis not present

## 2018-11-30 DIAGNOSIS — M961 Postlaminectomy syndrome, not elsewhere classified: Secondary | ICD-10-CM | POA: Diagnosis not present

## 2018-12-03 ENCOUNTER — Other Ambulatory Visit: Payer: Self-pay

## 2018-12-03 ENCOUNTER — Encounter (HOSPITAL_COMMUNITY): Payer: Self-pay | Admitting: Emergency Medicine

## 2018-12-03 ENCOUNTER — Emergency Department (HOSPITAL_COMMUNITY): Payer: Medicare Other

## 2018-12-03 ENCOUNTER — Emergency Department (HOSPITAL_COMMUNITY)
Admission: EM | Admit: 2018-12-03 | Discharge: 2018-12-03 | Disposition: A | Discharge status: Home / Self Care | Payer: Medicare Other | Attending: Emergency Medicine | Admitting: Emergency Medicine

## 2018-12-03 DIAGNOSIS — I10 Essential (primary) hypertension: Secondary | ICD-10-CM | POA: Insufficient documentation

## 2018-12-03 DIAGNOSIS — Z79899 Other long term (current) drug therapy: Secondary | ICD-10-CM | POA: Diagnosis not present

## 2018-12-03 DIAGNOSIS — F419 Anxiety disorder, unspecified: Secondary | ICD-10-CM | POA: Diagnosis not present

## 2018-12-03 DIAGNOSIS — F17210 Nicotine dependence, cigarettes, uncomplicated: Secondary | ICD-10-CM | POA: Insufficient documentation

## 2018-12-03 DIAGNOSIS — F410 Panic disorder [episodic paroxysmal anxiety] without agoraphobia: Secondary | ICD-10-CM | POA: Diagnosis not present

## 2018-12-03 DIAGNOSIS — F909 Attention-deficit hyperactivity disorder, unspecified type: Secondary | ICD-10-CM | POA: Diagnosis not present

## 2018-12-03 DIAGNOSIS — R0602 Shortness of breath: Secondary | ICD-10-CM | POA: Diagnosis not present

## 2018-12-03 DIAGNOSIS — R0600 Dyspnea, unspecified: Secondary | ICD-10-CM | POA: Diagnosis not present

## 2018-12-03 LAB — CBC
HCT: 45.5 % (ref 39.0–52.0)
Hemoglobin: 15.6 g/dL (ref 13.0–17.0)
MCH: 30.2 pg (ref 26.0–34.0)
MCHC: 34.3 g/dL (ref 30.0–36.0)
MCV: 88.2 fL (ref 80.0–100.0)
Platelets: 215 10*3/uL (ref 150–400)
RBC: 5.16 MIL/uL (ref 4.22–5.81)
RDW: 11.4 % — ABNORMAL LOW (ref 11.5–15.5)
WBC: 9 10*3/uL (ref 4.0–10.5)
nRBC: 0 % (ref 0.0–0.2)

## 2018-12-03 LAB — BASIC METABOLIC PANEL
Anion gap: 11 (ref 5–15)
BUN: 17 mg/dL (ref 6–20)
CO2: 23 mmol/L (ref 22–32)
Calcium: 9.3 mg/dL (ref 8.9–10.3)
Chloride: 103 mmol/L (ref 98–111)
Creatinine, Ser: 1.02 mg/dL (ref 0.61–1.24)
GFR calc Af Amer: >60 mL/min (ref >60)
GFR calc non Af Amer: >60 mL/min (ref >60)
Glucose, Bld: 104 mg/dL — ABNORMAL HIGH (ref 70–99)
Potassium: 3.9 mmol/L (ref 3.5–5.1)
Sodium: 137 mmol/L (ref 135–145)

## 2018-12-03 LAB — D-DIMER, QUANTITATIVE: D-Dimer, Quant: 0.39 ug/mL-FEU (ref 0.00–0.50)

## 2018-12-03 LAB — I-STAT TROPONIN, ED: Troponin i, poc: 0 ng/mL (ref 0.00–0.08)

## 2018-12-03 LAB — CBG MONITORING, ED: GLUCOSE-CAPILLARY: 101 mg/dL — AB (ref 70–99)

## 2018-12-03 MED ORDER — LORAZEPAM 1 MG PO TABS
1.0000 mg | ORAL_TABLET | Freq: Once | ORAL | Status: AC
  Administered 2018-12-03: 1 mg via ORAL
  Filled 2018-12-03: qty 1

## 2018-12-03 NOTE — Discharge Instructions (Addendum)
Follow-up with your doctor.  Return here as needed.  Your testing here tonight was normal.

## 2018-12-03 NOTE — ED Notes (Signed)
Patient verbalizes understanding of medications and discharge instructions. No further questions at this time. VSS and patient ambulatory at discharge.   

## 2018-12-03 NOTE — ED Provider Notes (Signed)
[facility] EMERGENCY DEPARTMENT Provider Note   CSN: [id number] Arrival date & time: 12/03/18  0737     History   Chief Complaint Chief Complaint  Patient presents with  . Shortness of Breath    HPI [name] is a 43 y.o. male.  HPI Patient presents to the emergency department with what he feels like his an anxiety attack.  The patient states that he feels like his breathing is fast and he is got tightness in his chest.  He states that this is happened before where he gets sweaty when he has an anxiety attack.  The patient states that they stopped his Klonopin 2 weeks ago.  The patient states that nothing seems to make the condition better or worse.  The patient denies chest pain, headache,blurred vision, neck pain, fever, cough, weakness, numbness, dizziness, anorexia, edema, abdominal pain, nausea, vomiting, diarrhea, rash, back pain, dysuria, hematemesis, bloody stool, near syncope, or syncope. Past Medical History:  Diagnosis Date  . ADHD (attention deficit hyperactivity disorder)   . Anxiety   . Arthritis    knee and back  . Chronic back pain   . Depression   . GERD (gastroesophageal reflux disease)   . Hypertension   . Neuropathy   . Obesity   . PONV (postoperative nausea and vomiting)     Patient Active Problem List   Diagnosis Date Noted  . Fall 11/07/2017  . Skin rash 11/07/2017  . Spinal stenosis, lumbar region, with neurogenic claudication 08/06/2016    Class: Chronic  . DDD (degenerative disc disease), lumbar 08/06/2016    Class: Chronic  . Lumbar disc herniation with radiculopathy 08/06/2016    Class: Chronic  . Neurogenic claudication due to lumbar spinal stenosis 08/06/2016  . Vitamin D deficiency 06/30/2014  . Right knee pain 06/30/2014  . Essential hypertension, benign 03/09/2014  . Anxiety and depression 03/09/2014  . Neuropathic pain 03/09/2014  . Smoking 03/09/2014  . Hemorrhoid 03/09/2014  . GERD (gastroesophageal  reflux disease) 03/09/2014    Past Surgical History:  Procedure Laterality Date  . BACK SURGERY    . CARPAL TUNNEL RELEASE Left   . HAND SURGERY    . KNEE SURGERY Right   . TONSILLECTOMY          Home Medications    Prior to Admission medications   Medication Sig Start Date End Date Taking? Authorizing Provider  acetaminophen (TYLENOL) 500 MG tablet Take 1,000 mg by mouth every 6 (six) hours as needed for moderate pain.    [provider]  amoxicillin-clavulanate (AUGMENTIN) 875-125 MG tablet Take 1 tablet by mouth every 12 (twelve) hours. 11/23/17   Cathie Hoops, Amy V, PA-C  amphetamine-dextroamphetamine (ADDERALL) 30 MG tablet Take 1 tablet 2 (two) times daily by mouth. 09/13/17   [provider]  celecoxib (CELEBREX) 200 MG capsule TAKE ONE CAPSULE BY MOUTH TWICE DAILY 05/04/18   Kerrin Champagne, MD  cetirizine (ZYRTEC) 10 MG tablet Take 1 tablet (10 mg total) by mouth daily. 11/23/17   Cathie Hoops, Amy V, PA-C  clonazePAM (KLONOPIN) 0.5 MG tablet Take 1 TABLET BY MOUTH at 8 IN THE MORNING and at 8 pm 09/10/17   [provider]  cyclobenzaprine (FLEXERIL) 10 MG tablet Take 1 tablet (10 mg total) by mouth 3 (three) times daily as needed for muscle spasms. 10/29/17   Beola Cord, MD  diclofenac sodium (VOLTAREN) 1 % GEL Apply 4 g topically 4 (four) times daily. 09/17/17   Joy, Hillard Danker,  PA-C  fluticasone (FLONASE) 50 MCG/ACT nasal spray Place 2 sprays into both nostrils daily. 11/23/17   Cathie HoopsYu, Amy V, PA-C  gabapentin (NEURONTIN) 800 MG tablet Take 1 tablet (800 mg total) by mouth 2 (two) times daily. 11/24/17   Kerrin ChampagneNitka, James E, MD  gemfibrozil (LOPID) 600 MG tablet Take 600 mg by mouth 2 (two) times daily. 05/16/15   [provider]  ibuprofen (ADVIL,MOTRIN) 200 MG tablet Take 600 mg by mouth.    [provider]  metoprolol succinate (TOPROL-XL) 25 MG 24 hr tablet Take 1 tablet (25 mg total) by mouth 2 (two) times daily. Take 1 tablet BID for hypertention  09/01/17   Kerrin ChampagneNitka, James E, MD  omeprazole (PRILOSEC) 20 MG capsule TAKE ONE CAPSULE BY MOUTH EVERY DAY 05/04/18   Beola CordMelvin, Alexander, MD  albuterol (PROVENTIL HFA;VENTOLIN HFA) 108 (90 BASE) MCG/ACT inhaler Inhale 1-2 puffs into the lungs every 6 (six) hours as needed for wheezing or shortness of breath. Patient not taking: Reported on 06/14/2015 02/03/15 10/06/15  Charlynne PanderYao, David Hsienta, MD  metoCLOPramide (REGLAN) 10 MG tablet Take 1 tablet (10 mg total) by mouth every 6 (six) hours as needed for nausea. Patient not taking: Reported on 09/29/2015 02/03/15 10/06/15  Charlynne PanderYao, David Hsienta, MD    Family History Family History  Problem Relation Age of Onset  . Heart failure Mother   . Heart disease Mother   . Diabetes Father   . Stroke Brother     Social History Social History   Tobacco Use  . Smoking status: Current Every Day Smoker    Packs/day: 0.50    Years: 18.00    Pack years: 9.00    Types: Cigarettes  . Smokeless tobacco: Never Used  Substance Use Topics  . Alcohol use: No    Comment: quit 2009  . Drug use: No     Allergies   Tramadol and Vicodin [hydrocodone-acetaminophen]   Review of Systems Review of Systems All other systems negative except as documented in the HPI. All pertinent positives and negatives as reviewed in the HPI.  Physical Exam Updated Vital Signs BP 113/68   Pulse 62   Temp 98 F (36.7 C) (Oral)   Resp 14   Ht 5\' 11"  (1.803 m)   Wt (!) 163.3 kg   SpO2 98%   BMI 50.21 kg/m   Physical Exam Vitals signs and nursing note reviewed.  Constitutional:      General: He is not in acute distress.    Appearance: He is well-developed.  HENT:     Head: Normocephalic and atraumatic.  Eyes:     Pupils: Pupils are equal, round, and reactive to light.  Neck:     Musculoskeletal: Normal range of motion and neck supple.  Cardiovascular:     Rate and Rhythm: Normal rate and regular rhythm.     Heart sounds: Normal heart sounds. No murmur. No friction rub. No  gallop.   Pulmonary:     Effort: Pulmonary effort is normal. No respiratory distress.     Breath sounds: Normal breath sounds. No wheezing.  Abdominal:     General: Bowel sounds are normal. There is no distension.     Palpations: Abdomen is soft.     Tenderness: There is no abdominal tenderness.  Skin:    General: Skin is warm and moist.     Capillary Refill: Capillary refill takes less than 2 seconds.     Findings: No erythema or rash.  Neurological:  Mental Status: He is alert and oriented to person, place, and time.     Motor: No abnormal muscle tone.     Coordination: Coordination normal.  Psychiatric:        Mood and Affect: Mood is anxious.        Behavior: Behavior normal.      ED Treatments / Results  Labs (all labs ordered are listed, but only abnormal results are displayed) Labs Reviewed  BASIC METABOLIC PANEL - Abnormal; Notable for the following components:      Result Value   Glucose, Bld 104 (*)    All other components within normal limits  CBC - Abnormal; Notable for the following components:   RDW 11.4 (*)    All other components within normal limits  CBG MONITORING, ED - Abnormal; Notable for the following components:   Glucose-Capillary 101 (*)    All other components within normal limits  D-DIMER, QUANTITATIVE (NOT AT [facility])  I-STAT TROPONIN, ED    EKG EKG Interpretation  Date/Time:  Thursday December 03 2018 20:23:25 EST Ventricular Rate:  68 PR Interval:    QRS Duration: 106 QT Interval:  404 QTC Calculation: 430 R Axis:   1 Text Interpretation:  Sinus rhythm ST elev, probable normal early repol pattern Confirmed by Raeford RazorKohut, Stephen [phone]([id number]) on 12/03/2018 9:52:03 PM   Radiology Dg Chest 2 View  Result Date: 12/03/2018 CLINICAL DATA:  Dyspnea for 2 hours EXAM: CHEST - 2 VIEW COMPARISON:  08/02/2016 FINDINGS: The heart size and mediastinal contours are within normal limits. Both lungs are clear. The visualized skeletal structures are  unremarkable. IMPRESSION: No active cardiopulmonary disease. Electronically Signed   By: Tollie Ethavid  Kwon M.D.   On: 12/03/2018 21:29    Procedures Procedures (including critical care time)  Medications Ordered in ED Medications  LORazepam (ATIVAN) tablet 1 mg (1 mg Oral Given 12/03/18 2045)     Initial Impression / Assessment and Plan / ED Course  I have reviewed the triage vital signs and the nursing notes.  Pertinent labs & imaging results that were available during my care of the patient were reviewed by me and considered in my medical decision making (see chart for details).    Patient has been sleeping since receiving the Ativan.  The patient states he is feeling no symptoms at this time.  Patient will be referred back to his primary doctor.  Initially I was concerned this patient was having some sort of cardiac or pulmonary event.  Patient's laboratory testing does not show any significant abnormalities. Final Clinical Impressions(s) / ED Diagnoses   Final diagnoses:  None    ED Discharge Orders    None       Kyra MangesLawyer, Dravin Lance, PA-C 12/03/18 2331    Raeford RazorKohut, Stephen, MD 12/08/18 1757

## 2018-12-03 NOTE — ED Provider Notes (Signed)
Asked to see patient by triage RN.  Chief Complaint: Shortness of breath  HPI:   Patient reports that approximately 2 hours ago he was sitting on the couch watching TV when he started to feel very short of breath.  He says that he got cold sweaty and came here.  He did stop taking his Klonopin approximately 3 weeks ago at his doctor's instruction.  He denies any recent fevers illness nausea vomiting diarrhea.  He says that he was feeling well until the shortness of breath started.  He is requesting something to help with his nerves.  ROS: Shortness of breath  Physical Exam:   Gen: Cool, clammy skin, diaphoretic, pale  Neuro: Awake and Alert, able to answer questions  Lungs clear to auscultation bilaterally.  No tracheal deviation.  Moves all 4 extremities.    Informed triage RN that I am concerned about patient and he needs to be moved to a room in the back for further evaluation.  CBG is normal.    [name] 12/03/18 [name]    [name], MD 12/08/18 1759

## 2018-12-03 NOTE — ED Triage Notes (Signed)
Pt states he has been SOB for 2 hours while watching TV. Denies chest pain. Also states he is out of his klonopin and sweating profusely and states he needs his anxiety medicine

## 2018-12-03 NOTE — ED Notes (Signed)
Patient up moving around room. States "my anxiety is bothering me, I can't sit still". Giving 1 mg Ativan.

## 2019-05-02 ENCOUNTER — Encounter (HOSPITAL_COMMUNITY): Payer: Self-pay | Admitting: *Deleted

## 2019-05-02 ENCOUNTER — Emergency Department (HOSPITAL_COMMUNITY)
Admission: EM | Admit: 2019-05-02 | Discharge: 2019-05-02 | Disposition: A | Discharge status: Home / Self Care | Payer: Medicare Other | Attending: Emergency Medicine | Admitting: Emergency Medicine

## 2019-05-02 ENCOUNTER — Other Ambulatory Visit: Payer: Self-pay

## 2019-05-02 ENCOUNTER — Emergency Department (HOSPITAL_COMMUNITY): Payer: Medicare Other

## 2019-05-02 DIAGNOSIS — I509 Heart failure, unspecified: Secondary | ICD-10-CM | POA: Diagnosis not present

## 2019-05-02 DIAGNOSIS — I110 Hypertensive heart disease with heart failure: Secondary | ICD-10-CM | POA: Diagnosis not present

## 2019-05-02 DIAGNOSIS — Z79899 Other long term (current) drug therapy: Secondary | ICD-10-CM | POA: Diagnosis not present

## 2019-05-02 DIAGNOSIS — F17210 Nicotine dependence, cigarettes, uncomplicated: Secondary | ICD-10-CM | POA: Diagnosis not present

## 2019-05-02 DIAGNOSIS — F909 Attention-deficit hyperactivity disorder, unspecified type: Secondary | ICD-10-CM | POA: Insufficient documentation

## 2019-05-02 DIAGNOSIS — R0602 Shortness of breath: Secondary | ICD-10-CM | POA: Diagnosis present

## 2019-05-02 LAB — BLOOD GAS, VENOUS
Acid-Base Excess: 5.4 mmol/L — ABNORMAL HIGH (ref 0.0–2.0)
Bicarbonate: 28.1 mmol/L — ABNORMAL HIGH (ref 20.0–28.0)
FIO2: 21
O2 Saturation: 90.5 %
Patient temperature: 37
pCO2, Ven: 53.9 mmHg (ref 44.0–60.0)
pH, Ven: 7.371 (ref 7.250–7.430)
pO2, Ven: 59.8 mmHg — ABNORMAL HIGH (ref 32.0–45.0)

## 2019-05-02 LAB — CBC WITH DIFFERENTIAL/PLATELET
Abs Immature Granulocytes: 0.01 10*3/uL (ref 0.00–0.07)
Basophils Absolute: 0 10*3/uL (ref 0.0–0.1)
Basophils Relative: 0 %
Eosinophils Absolute: 0.3 10*3/uL (ref 0.0–0.5)
Eosinophils Relative: 3 %
HCT: 37 % — ABNORMAL LOW (ref 39.0–52.0)
Hemoglobin: 12.7 g/dL — ABNORMAL LOW (ref 13.0–17.0)
Immature Granulocytes: 0 %
Lymphocytes Relative: 35 %
Lymphs Abs: 3 10*3/uL (ref 0.7–4.0)
MCH: 30.8 pg (ref 26.0–34.0)
MCHC: 34.3 g/dL (ref 30.0–36.0)
MCV: 89.6 fL (ref 80.0–100.0)
Monocytes Absolute: 0.7 10*3/uL (ref 0.1–1.0)
Monocytes Relative: 8 %
Neutro Abs: 4.8 10*3/uL (ref 1.7–7.7)
Neutrophils Relative %: 54 %
Platelets: 187 10*3/uL (ref 150–400)
RBC: 4.13 MIL/uL — ABNORMAL LOW (ref 4.22–5.81)
RDW: 12 % (ref 11.5–15.5)
WBC: 8.8 10*3/uL (ref 4.0–10.5)
nRBC: 0 % (ref 0.0–0.2)

## 2019-05-02 LAB — BASIC METABOLIC PANEL
Anion gap: 10 (ref 5–15)
BUN: 21 mg/dL — ABNORMAL HIGH (ref 6–20)
CO2: 26 mmol/L (ref 22–32)
Calcium: 8.8 mg/dL — ABNORMAL LOW (ref 8.9–10.3)
Chloride: 100 mmol/L (ref 98–111)
Creatinine, Ser: 0.82 mg/dL (ref 0.61–1.24)
GFR calc Af Amer: >60 mL/min (ref >60)
GFR calc non Af Amer: >60 mL/min (ref >60)
Glucose, Bld: 122 mg/dL — ABNORMAL HIGH (ref 70–99)
Potassium: 3.8 mmol/L (ref 3.5–5.1)
Sodium: 136 mmol/L (ref 135–145)

## 2019-05-02 LAB — D-DIMER, QUANTITATIVE: D-Dimer, Quant: 0.36 ug/mL-FEU (ref 0.00–0.50)

## 2019-05-02 LAB — TROPONIN I
Troponin I: <0.03 ng/mL (ref <0.03)
Troponin I: <0.03 ng/mL (ref <0.03)

## 2019-05-02 MED ORDER — DOXYCYCLINE HYCLATE 100 MG PO CAPS: 100.0000 mg | ORAL_CAPSULE | Freq: Two times a day (BID) | ORAL | 0 refills | Status: DC

## 2019-05-02 MED ORDER — FUROSEMIDE 40 MG PO TABS
20.0000 mg | ORAL_TABLET | Freq: Once | ORAL | Status: AC
  Administered 2019-05-02: 07:00:00 20 mg via ORAL
  Filled 2019-05-02: qty 1

## 2019-05-02 MED ORDER — LORAZEPAM 0.5 MG PO TABS
0.5000 mg | ORAL_TABLET | Freq: Once | ORAL | Status: AC
  Administered 2019-05-02: 0.5 mg via ORAL
  Filled 2019-05-02: qty 1

## 2019-05-02 MED ORDER — DOXYCYCLINE HYCLATE 100 MG PO TABS
100.0000 mg | ORAL_TABLET | Freq: Once | ORAL | Status: AC
  Administered 2019-05-02: 100 mg via ORAL
  Filled 2019-05-02: qty 1

## 2019-05-02 MED ORDER — FUROSEMIDE 20 MG PO TABS: 20.0000 mg | ORAL_TABLET | Freq: Every day | ORAL | 0 refills | Status: DC

## 2019-05-02 NOTE — ED Notes (Signed)
Pt reports that he is feeling better.

## 2019-05-02 NOTE — ED Notes (Signed)
Patient transported to CT 

## 2019-05-02 NOTE — ED Triage Notes (Signed)
Pt c/o "not feeling well" for the past few days, falling asleep and waking up sob, n/v, blood pressure elevated at home tonight 168/115,

## 2019-05-02 NOTE — ED Notes (Signed)
Pt sitting up in chair asleep, refused to get back into bed,

## 2019-05-02 NOTE — ED Notes (Signed)
Patient transported to X-ray 

## 2019-05-02 NOTE — Discharge Instructions (Signed)
It appears you could have some heart failure.   You declined admission to the hospital today.Take the water pill as prescribed and follow-up with your doctor for an echocardiogram.  You are also given antibiotics in case this presents something infectious.  You should also follow-up with your doctor for a sleep study to make sure you do not have sleep apnea.  Return to the ED with chest pain, worsening shortness of breath, leg pain or leg swelling or any other concerns.

## 2019-05-02 NOTE — ED Notes (Signed)
Pt ambulated well with oxygen levels maintaining at 98% and HR at 77. NAD

## 2019-05-02 NOTE — ED Notes (Signed)
Pt states that he is out of his anxiety medication, requested anxiety medication three separate times during triage,

## 2019-05-02 NOTE — ED Notes (Signed)
Pt refused covid testing, Dr Rancour notified,  

## 2019-05-02 NOTE — ED Provider Notes (Signed)
[facility] EMERGENCY DEPARTMENT Provider Note   CSN: [id number] Arrival date & time: 05/02/19  0303    History   Chief Complaint Chief Complaint  Patient presents with  . Shortness of Breath    HPI [name] is a 43 y.o. male.     Patient with history of morbid obesity, anxiety, acid reflux disease and hypertension presenting with difficulty breathing for the past 3 days.  States he has had episodes of waking up short of breath and gasping for breath.  He has also been having episodes of nodding off while he is just sitting he has been having episodes of nodding off while he is just sitting resting.  He woke up short of breath tonight and his blood pressure was elevated at 168/115 so he decided to come in.  He normally has a well-controlled blood pressure and states compliance with his medications.  He feels he is more short of breath when he tries to lie flat but is never been diagnosed with CHF or COPD.  Denies any history of sleep apnea but states he was tested in the past and was "borderline".  Does not use CPAP at home.  He denies chest pain or cough or fever.  No leg pain or leg swelling.  Notably he has been out of his Klonopin for the past 1 week. He takes chronic pain medication as well.  The history is provided by the patient.    Past Medical History:  Diagnosis Date  . ADHD (attention deficit hyperactivity disorder)   . Anxiety   . Arthritis    knee and back  . Chronic back pain   . Depression   . GERD (gastroesophageal reflux disease)   . Hypertension   . Neuropathy   . Obesity   . PONV (postoperative nausea and vomiting)     Patient Active Problem List   Diagnosis Date Noted  . Fall 11/07/2017  . Skin rash 11/07/2017  . Spinal stenosis, lumbar region, with neurogenic claudication 08/06/2016    Class: Chronic  . DDD (degenerative disc disease), lumbar 08/06/2016    Class: Chronic  . Lumbar disc herniation with radiculopathy 08/06/2016    Class:  Chronic  . Neurogenic claudication due to lumbar spinal stenosis 08/06/2016  . Vitamin D deficiency 06/30/2014  . Right knee pain 06/30/2014  . Essential hypertension, benign 03/09/2014  . Anxiety and depression 03/09/2014  . Neuropathic pain 03/09/2014  . Smoking 03/09/2014  . Hemorrhoid 03/09/2014  . GERD (gastroesophageal reflux disease) 03/09/2014    Past Surgical History:  Procedure Laterality Date  . BACK SURGERY    . CARPAL TUNNEL RELEASE Left   . HAND SURGERY    . KNEE SURGERY Right   . TONSILLECTOMY          Home Medications    Prior to Admission medications   Medication Sig Start Date End Date Taking? Authorizing Provider  acetaminophen (TYLENOL) 500 MG tablet Take 1,000 mg by mouth every 6 (six) hours as needed for moderate pain.   Yes [provider]  amphetamine-dextroamphetamine (ADDERALL) 30 MG tablet Take 1 tablet 2 (two) times daily by mouth. 09/13/17  Yes [provider]  clonazePAM (KLONOPIN) 0.5 MG tablet Take 1 TABLET BY MOUTH at 8 IN THE MORNING and at 8 pm 09/10/17  Yes [provider]  cyclobenzaprine (FLEXERIL) 10 MG tablet Take 1 tablet (10 mg total) by mouth 3 (three) times daily as needed for muscle spasms. 10/29/17  Yes Neva Seat,  MD  cyclobenzaprine (FLEXERIL) 10 MG tablet Take by mouth. 02/25/19 05/26/19 Yes [provider]  diclofenac sodium (VOLTAREN) 1 % GEL Apply 4 g topically 4 (four) times daily. 09/17/17  Yes Joy, Shawn C, PA-C  fluticasone (FLONASE) 50 MCG/ACT nasal spray Place 2 sprays into both nostrils daily. 11/23/17  Yes Yu, Amy V, PA-C  gabapentin (NEURONTIN) 800 MG tablet Take 1 tablet (800 mg total) by mouth 2 (two) times daily. 11/24/17  Yes Kerrin ChampagneNitka, James E, MD  gemfibrozil (LOPID) 600 MG tablet Take 600 mg by mouth 2 (two) times daily. 05/16/15  Yes [provider]  hydrochlorothiazide (HYDRODIURIL) 25 MG tablet  03/15/19  Yes [provider]  metoprolol succinate (TOPROL-XL)  25 MG 24 hr tablet Take 1 tablet (25 mg total) by mouth 2 (two) times daily. Take 1 tablet BID for hypertention 09/01/17  Yes Kerrin ChampagneNitka, James E, MD  omeprazole (PRILOSEC) 20 MG capsule TAKE ONE CAPSULE BY MOUTH EVERY DAY 05/04/18  Yes Beola CordMelvin, Alexander, MD  oxyCODONE-acetaminophen (PERCOCET) 10-325 MG tablet Take 1 tablet by mouth 3 (three) times daily.   Yes [provider]  PARoxetine (PAXIL) 10 MG tablet Take 10 mg by mouth daily. 03/31/19  Yes [provider]  amoxicillin-clavulanate (AUGMENTIN) 875-125 MG tablet Take 1 tablet by mouth every 12 (twelve) hours. 11/23/17   Cathie HoopsYu, Amy V, PA-C  celecoxib (CELEBREX) 200 MG capsule TAKE ONE CAPSULE BY MOUTH TWICE DAILY 05/04/18   Kerrin ChampagneNitka, James E, MD  cetirizine (ZYRTEC) 10 MG tablet Take 1 tablet (10 mg total) by mouth daily. 11/23/17   Belinda FisherYu, Amy V, PA-C  gabapentin (NEURONTIN) 800 MG tablet  03/15/19   [provider]  ibuprofen (ADVIL,MOTRIN) 200 MG tablet Take 600 mg by mouth.    [provider]  omeprazole (PRILOSEC) 40 MG capsule take 1 capsule (40 mg) by oral route once daily before a meal for 90 days 03/15/19   [provider]    Family History Family History  Problem Relation Age of Onset  . Heart failure Mother   . Heart disease Mother   . Diabetes Father   . Stroke Brother     Social History Social History   Tobacco Use  . Smoking status: Current Every Day Smoker    Packs/day: 0.50    Years: 18.00    Pack years: 9.00    Types: Cigarettes  . Smokeless tobacco: Never Used  Substance Use Topics  . Alcohol use: No    Comment: quit 2009  . Drug use: No     Allergies   Tramadol and Vicodin [hydrocodone-acetaminophen]   Review of Systems Review of Systems  Constitutional: Negative for activity change, appetite change and fever.  HENT: Negative for congestion and rhinorrhea.   Eyes: Negative for visual disturbance.  Respiratory: Positive for chest tightness and shortness of breath.    Cardiovascular: Negative for chest pain.  Gastrointestinal: Negative for abdominal pain, nausea and vomiting.  Genitourinary: Negative for dysuria and hematuria.  Musculoskeletal: Negative for arthralgias and myalgias.  Skin: Negative for rash.  Neurological: Negative for light-headedness and headaches.   all other systems are negative except as noted in the HPI and PMH.     Physical Exam Updated Vital Signs BP 132/78   Pulse 81   Temp 98.3 F (36.8 C) (Oral)   Resp 16   Ht 5\' 11"  (1.803 m)   Wt (!) 156.5 kg   SpO2 96%   BMI 48.12 kg/m   Physical Exam Vitals signs  and nursing note reviewed.  Constitutional:      General: He is not in acute distress.    Appearance: He is well-developed. He is obese.  HENT:     Head: Normocephalic and atraumatic.     Mouth/Throat:     Pharynx: No oropharyngeal exudate.  Eyes:     Conjunctiva/sclera: Conjunctivae normal.     Pupils: Pupils are equal, round, and reactive to light.  Neck:     Musculoskeletal: Normal range of motion and neck supple.     Comments: No meningismus. Cardiovascular:     Rate and Rhythm: Normal rate and regular rhythm.     Heart sounds: Normal heart sounds. No murmur.  Pulmonary:     Effort: Pulmonary effort is normal. No respiratory distress.     Breath sounds: Normal breath sounds.  Chest:     Chest wall: No tenderness.  Abdominal:     Palpations: Abdomen is soft.     Tenderness: There is no abdominal tenderness. There is no guarding or rebound.  Musculoskeletal: Normal range of motion.        General: No tenderness.  Skin:    General: Skin is warm.     Capillary Refill: Capillary refill takes less than 2 seconds.  Neurological:     Mental Status: He is alert and oriented to person, place, and time.     Cranial Nerves: No cranial nerve deficit.     Motor: No abnormal muscle tone.     Coordination: Coordination normal.     Comments: No ataxia on finger to nose bilaterally. No pronator drift. 5/5  strength throughout. CN 2-12 intact.Equal grip strength. Sensation intact.   Psychiatric:        Behavior: Behavior normal.      ED Treatments / Results  Labs (all labs ordered are listed, but only abnormal results are displayed) Labs Reviewed  CBC WITH DIFFERENTIAL/PLATELET - Abnormal; Notable for the following components:      Result Value   RBC 4.13 (*)    Hemoglobin 12.7 (*)    HCT 37.0 (*)    All other components within normal limits  BASIC METABOLIC PANEL - Abnormal; Notable for the following components:   Glucose, Bld 122 (*)    BUN 21 (*)    Calcium 8.8 (*)    All other components within normal limits  BLOOD GAS, VENOUS - Abnormal; Notable for the following components:   pO2, Ven 59.8 (*)    Bicarbonate 28.1 (*)    Acid-Base Excess 5.4 (*)    All other components within normal limits  SARS CORONAVIRUS 2 (HOSPITAL ORDER, PERFORMED IN [facility])  TROPONIN I  D-DIMER, QUANTITATIVE (NOT AT [facility])  TROPONIN I    EKG EKG Interpretation  Date/Time:  Sunday May 02 2019 03:15:21 EDT Ventricular Rate:  78 PR Interval:    QRS Duration: 114 QT Interval:  394 QTC Calculation: 449 R Axis:   40 Text Interpretation:  Sinus rhythm Borderline intraventricular conduction delay Low voltage, precordial leads No significant change was found Confirmed by Glynn Octaveancour, Blayden Conwell [phone]([id number]) on 05/02/2019 3:28:55 AM   Radiology Dg Chest 2 View  Result Date: 05/02/2019 CLINICAL DATA:  Patient not feeling well. EXAM: CHEST - 2 VIEW COMPARISON:  Chest radiograph 12/03/2018 FINDINGS: Monitoring leads overlie the patient. Stable enlarged cardiac and mediastinal contours. Interval development of mild mid lower lung heterogeneous opacities bilaterally. No pleural effusion or pneumothorax. IMPRESSION: New mid lower lung heterogeneous opacities which may represent edema or  atypical infectious process. Electronically Signed   By: Annia Beltrew  Davis M.D.   On: 05/02/2019 04:45    Procedures  Procedures (including critical care time)  Medications Ordered in ED Medications - No data to display   Initial Impression / Assessment and Plan / ED Course  I have reviewed the triage vital signs and the nursing notes.  Pertinent labs & imaging results that were available during my care of the patient were reviewed by me and considered in my medical decision making (see chart for details).       Patient here with shortness of breath spells and "nodding off".  Denies chest pain.  Elevated blood pressure at home which has since resolved here.  EKG is nonischemic.  He is in no distress. Body habitus suggest sleep apnea but he states he was tested for this and was "borderline".  ABG without significant CO2 retention.  Chest x-ray shows new lower lung zone heterogeneous opacities. Patient refuses coronavirus testing and denies any exposures. He denies any cough or fever. \He is ambulatory without desaturation.  Suspect this could be more edema than infectious.  Will give trial of Lasix.  Patient reports no history of heart failure.  He is able to ambulate without desaturation.  He prefers to go home. He has a doctor at the [facility] [facility]  He agrees to a second heart enzyme He does not want any admitted to the hospital.  States he will follow-up with his doctor and return if he gets worse.  Advised to return to the hospital with difficulty breathing or chest pain or other concerns.  Troponin negative x2. D-dimer negative. No chest pain. No hypoxia with ambulation.  Will give short course of lasix and antibiotics for home. Followup with PCP and cardiology for echo and possibly stress test. Likely needs repeat sleep study as well. Return precautions discussed.  Dante GangCharles E Malek was evaluated in Emergency Department on 05/02/2019 for the symptoms described in the history of present illness. He was evaluated in the context of the global COVID-19 pandemic, which necessitated  consideration that the patient might be at risk for infection with the SARS-CoV-2 virus that causes COVID-19. Institutional protocols and algorithms that pertain to the evaluation of patients at risk for COVID-19 are in a state of rapid change based on information released by regulatory bodies including the CDC and federal and state organizations. These policies and algorithms were followed during the patient's care in the ED.  Final Clinical Impressions(s) / ED Diagnoses   Final diagnoses:  Congestive heart failure, unspecified HF chronicity, unspecified heart failure type [facility](HCC)    ED Discharge Orders    None       Glynn Octaveancour, Diesha Rostad, MD 05/02/19 0945

## 2019-05-03 ENCOUNTER — Telehealth: Payer: Self-pay | Admitting: Cardiovascular Disease

## 2019-05-03 NOTE — Telephone Encounter (Signed)
Virtual Visit Pre-Appointment Phone Call  "(Name), I am calling you today to discuss your upcoming appointment. We are currently trying to limit exposure to the virus that causes COVID-19 by seeing patients at home rather than in the office."  1. "What is the BEST phone number to call the day of the visit?" - include this in appointment notes  2. Do you have or have access to (through a family member/friend) a smartphone with video capability that we can use for your visit?" a. If yes - list this number in appt notes as cell (if different from BEST phone #) and list the appointment type as a VIDEO visit in appointment notes b. If no - list the appointment type as a PHONE visit in appointment notes  3. Confirm consent - "In the setting of the current Covid19 crisis, you are scheduled for a (phone or video) visit with your provider on (date) at (time).  Just as we do with many in-office visits, in order for you to participate in this visit, we must obtain consent.  If you'd like, I can send this to your mychart (if signed up) or email for you to review.  Otherwise, I can obtain your verbal consent now.  All virtual visits are billed to your insurance company just like a normal visit would be.  By agreeing to a virtual visit, we'd like you to understand that the technology does not allow for your provider to perform an examination, and thus may limit your provider's ability to fully assess your condition. If your provider identifies any concerns that need to be evaluated in person, we will make arrangements to do so.  Finally, though the technology is pretty good, we cannot assure that it will always work on either your or our end, and in the setting of a video visit, we may have to convert it to a phone-only visit.  In either situation, we cannot ensure that we have a secure connection.  Are you willing to proceed?" STAFF: Did the patient verbally acknowledge consent to telehealth visit? Document  YES/NO here: YES  4. Advise patient to be prepared - "Two hours prior to your appointment, go ahead and check your blood pressure, pulse, oxygen saturation, and your weight (if you have the equipment to check those) and write them all down. When your visit starts, your provider will ask you for this information. If you have an Apple Watch or Kardia device, please plan to have heart rate information ready on the day of your appointment. Please have a pen and paper handy nearby the day of the visit as well."  5. Give patient instructions for MyChart download to smartphone OR [email or web address] as below if video visit (depending on what platform provider is using)  6. Inform patient they will receive a phone call 15 minutes prior to their appointment time (may be from unknown caller ID) so they should be prepared to answer    TELEPHONE CALL NOTE  [name] has been deemed a candidate for a follow-up tele-health visit to limit community exposure during the Covid-19 pandemic. I spoke with the patient via phone to ensure availability of phone/video source, confirm preferred email & phone number, and discuss instructions and expectations.  I reminded [name] to be prepared with any vital sign and/or heart rhythm information that could potentially be obtained via home monitoring, at the time of his visit. I reminded [name] to expect a phone call prior to  his visit.  Weston Anna 05/03/2019 11:27 AM   INSTRUCTIONS FOR DOWNLOADING THE MYCHART APP TO SMARTPHONE  - The patient must first make sure to have activated MyChart and know their login information - If Apple, go to [employer] and type in MyChart in the search bar and download the app. If Android, ask patient to go to [employer] and type in [place] in the search bar and download the app. The app is free but as with any other app downloads, their phone may require them to verify saved payment information or  Apple/Android password.  - The patient will need to then log into the app with their MyChart username and password, and select [facility] as their healthcare provider to link the account. When it is time for your visit, go to the MyChart app, find appointments, and click Begin Video Visit. Be sure to Select Allow for your device to access the Microphone and Camera for your visit. You will then be connected, and your provider will be with you shortly.  **If they have any issues connecting, or need assistance please contact MyChart service desk [phone])**  **If using a computer, in order to ensure the best quality for their visit they will need to use either of the following Internet Browsers: [employer], or Google Chrome**  IF USING DOXIMITY or DOXY.ME - The patient will receive a link just prior to their visit by text.     FULL LENGTH CONSENT FOR TELE-HEALTH VISIT   I hereby voluntarily request, consent and authorize [facility] and its employed or contracted physicians, physician assistants, nurse practitioners or other licensed health care professionals (the Practitioner), to provide me with telemedicine health care services (the Services") as deemed necessary by the treating Practitioner. I acknowledge and consent to receive the Services by the Practitioner via telemedicine. I understand that the telemedicine visit will involve communicating with the Practitioner through live audiovisual communication technology and the disclosure of certain medical information by electronic transmission. I acknowledge that I have been given the opportunity to request an in-person assessment or other available alternative prior to the telemedicine visit and am voluntarily participating in the telemedicine visit.  I understand that I have the right to withhold or withdraw my consent to the use of telemedicine in the course of my care at any time, without affecting my right to future care  or treatment, and that the Practitioner or I may terminate the telemedicine visit at any time. I understand that I have the right to inspect all information obtained and/or recorded in the course of the telemedicine visit and may receive copies of available information for a reasonable fee.  I understand that some of the potential risks of receiving the Services via telemedicine include:   Delay or interruption in medical evaluation due to technological equipment failure or disruption;  Information transmitted may not be sufficient (e.g. poor resolution of images) to allow for appropriate medical decision making by the Practitioner; and/or   In rare instances, security protocols could fail, causing a breach of personal health information.  Furthermore, I acknowledge that it is my responsibility to provide information about my medical history, conditions and care that is complete and accurate to the best of my ability. I acknowledge that Practitioner's advice, recommendations, and/or decision may be based on factors not within their control, such as incomplete or inaccurate data provided by me or distortions of diagnostic images or specimens that may result from electronic transmissions. I  understand that the practice of medicine is not an exact science and that Practitioner makes no warranties or guarantees regarding treatment outcomes. I acknowledge that I will receive a copy of this consent concurrently upon execution via email to the email address I last provided but may also request a printed copy by calling the office of [facility].    I understand that my insurance will be billed for this visit.   I have read or had this consent read to me.  I understand the contents of this consent, which adequately explains the benefits and risks of the Services being provided via telemedicine.   I have been provided ample opportunity to ask questions regarding this consent and the Services and have had  my questions answered to my satisfaction.  I give my informed consent for the services to be provided through the use of telemedicine in my medical care  By participating in this telemedicine visit I agree to the above.

## 2019-05-04 ENCOUNTER — Other Ambulatory Visit: Payer: Self-pay

## 2019-05-04 ENCOUNTER — Encounter: Payer: Self-pay | Admitting: Cardiovascular Disease

## 2019-05-04 ENCOUNTER — Ambulatory Visit (INDEPENDENT_AMBULATORY_CARE_PROVIDER_SITE_OTHER): Payer: Medicare Other | Admitting: Cardiovascular Disease

## 2019-05-04 VITALS — BP 192/80 | HR 83 | Ht 71.0 in | Wt 365.0 lb

## 2019-05-04 DIAGNOSIS — I10 Essential (primary) hypertension: Secondary | ICD-10-CM | POA: Diagnosis not present

## 2019-05-04 DIAGNOSIS — R400 Somnolence: Secondary | ICD-10-CM | POA: Diagnosis not present

## 2019-05-04 DIAGNOSIS — R0602 Shortness of breath: Secondary | ICD-10-CM | POA: Diagnosis not present

## 2019-05-04 DIAGNOSIS — F410 Panic disorder [episodic paroxysmal anxiety] without agoraphobia: Secondary | ICD-10-CM

## 2019-05-04 DIAGNOSIS — I509 Heart failure, unspecified: Secondary | ICD-10-CM | POA: Diagnosis not present

## 2019-05-04 DIAGNOSIS — G4730 Sleep apnea, unspecified: Secondary | ICD-10-CM

## 2019-05-04 MED ORDER — LOSARTAN POTASSIUM 25 MG PO TABS: 25.0000 mg | ORAL_TABLET | Freq: Every day | ORAL | 3 refills | Status: DC

## 2019-05-04 NOTE — Progress Notes (Signed)
Virtual Visit via Video Note   This visit type was conducted due to national recommendations for restrictions regarding the COVID-19 Pandemic (e.g. social distancing) in an effort to limit this patient's exposure and mitigate transmission in our community.  Due to his co-morbid illnesses, this patient is at least at moderate risk for complications without adequate follow up.  This format is felt to be most appropriate for this patient at this time.  All issues noted in this document were discussed and addressed.  A limited physical exam was performed with this format.  Please refer to the patient's chart for his consent to telehealth for [facility].   Date:  05/04/2019   ID:  Bobby Gangharles E Ramirez, DOB 1976-10-08, MRN [id number]  Patient Location: Home Provider Location: Home  PCP:  Patient, No Pcp Per  Cardiologist:  No primary care provider on file.  Electrophysiologist:  None   Evaluation Performed:  New Patient Evaluation  Chief Complaint: Shortness of breath  History of Present Illness:    Bobby GangCharles E Riehl is a 43 y.o. male with hypertension, GERD, and morbid obesity and was recently evaluated for shortness of breath in the ED.  I personally reviewed all relevant documentation, labs, and studies.  His blood pressure was 168/115 at home so he decided to go to the ED.  He is more short of breath when lying flat.He denies exertional chest pain.  Chest x-ray showed new mid lower lung heterogeneous opacities which may represent edema or atypical infectious process.  He was given Lasix and antibiotics.  Troponins and d-dimer were normal.  Relevant labs: Hemoglobin 12.7.  Upon speaking with him further, it appears he has been having episodic shortness of breath over the past 4 weeks.  His blood pressure has been elevated.  He usually sleeps with one pillow and sometimes to as he become short of breath if he lies flat.  He may have had paroxysmal nocturnal dyspnea.  He was prescribed  20 mg of Lasix daily in the ED and has been taking it for the past 2 days.  He thinks his symptoms have improved to some degree.  He told me that he has bad GERD.  He also tell me that he has bad panic attacks and normally takes Klonopin.  He is now establishing with a new PCP.  He has not seen a behavioral health specialist in some time.  He has episodic chest tightness primarily when at rest.  He is also been dozing off more frequently.  He denies fevers and chills.   Past Medical History:  Diagnosis Date  . ADHD (attention deficit hyperactivity disorder)   . Anxiety   . Arthritis    knee and back  . Chronic back pain   . Depression   . GERD (gastroesophageal reflux disease)   . Hypertension   . Neuropathy   . Obesity   . PONV (postoperative nausea and vomiting)    Past Surgical History:  Procedure Laterality Date  . BACK SURGERY    . CARPAL TUNNEL RELEASE Left   . HAND SURGERY    . KNEE SURGERY Right   . TONSILLECTOMY       Current Meds  Medication Sig  . acetaminophen (TYLENOL) 500 MG tablet Take 1,000 mg by mouth every 6 (six) hours as needed for moderate pain.  Marland Kitchen. amphetamine-dextroamphetamine (ADDERALL) 30 MG tablet Take 1 tablet 2 (two) times daily by mouth.  . celecoxib (CELEBREX) 200 MG capsule TAKE ONE CAPSULE BY MOUTH TWICE  DAILY  . cetirizine (ZYRTEC) 10 MG tablet Take 1 tablet (10 mg total) by mouth daily.  . clonazePAM (KLONOPIN) 0.5 MG tablet Take 1 TABLET BY MOUTH at 8 IN THE MORNING and at 8 pm  . cyclobenzaprine (FLEXERIL) 10 MG tablet Take 1 tablet (10 mg total) by mouth 3 (three) times daily as needed for muscle spasms.  . cyclobenzaprine (FLEXERIL) 10 MG tablet Take by mouth.  . diclofenac sodium (VOLTAREN) 1 % GEL Apply 4 g topically 4 (four) times daily.  Marland Kitchen. doxycycline (VIBRAMYCIN) 100 MG capsule Take 1 capsule (100 mg total) by mouth 2 (two) times daily.  . fluticasone (FLONASE) 50 MCG/ACT nasal spray Place 2 sprays into both nostrils daily.  .  furosemide (LASIX) 20 MG tablet Take 1 tablet (20 mg total) by mouth daily.  Marland Kitchen. gabapentin (NEURONTIN) 800 MG tablet Take 1 tablet (800 mg total) by mouth 2 (two) times daily.  Marland Kitchen. gabapentin (NEURONTIN) 800 MG tablet   . gemfibrozil (LOPID) 600 MG tablet Take 600 mg by mouth 2 (two) times daily.  . hydrochlorothiazide (HYDRODIURIL) 25 MG tablet   . ibuprofen (ADVIL,MOTRIN) 200 MG tablet Take 600 mg by mouth.  . metoprolol succinate (TOPROL-XL) 25 MG 24 hr tablet Take 1 tablet (25 mg total) by mouth 2 (two) times daily. Take 1 tablet BID for hypertention  . omeprazole (PRILOSEC) 20 MG capsule TAKE ONE CAPSULE BY MOUTH EVERY DAY  . omeprazole (PRILOSEC) 40 MG capsule take 1 capsule (40 mg) by oral route once daily before a meal for 90 days  . oxyCODONE-acetaminophen (PERCOCET) 10-325 MG tablet Take 1 tablet by mouth 3 (three) times daily.  Marland Kitchen. PARoxetine (PAXIL) 10 MG tablet Take 10 mg by mouth daily.     Allergies:   Tramadol and Vicodin [hydrocodone-acetaminophen]   Social History   Tobacco Use  . Smoking status: Current Every Day Smoker    Packs/day: 0.50    Years: 18.00    Pack years: 9.00    Types: Cigarettes  . Smokeless tobacco: Never Used  Substance Use Topics  . Alcohol use: No    Comment: quit 2009  . Drug use: No     Family Hx: The patient's family history includes Diabetes in his father; Heart disease in his mother; Heart failure in his mother; Stroke in his brother.  ROS:   Please see the history of present illness.     All other systems reviewed and are negative.   Prior CV studies:   The following studies were reviewed today:  NA  Labs/Other Tests and Data Reviewed:    EKG:  An ECG dated 05/02/19 was personally reviewed today and demonstrated:  Sinus rhythm with nonspecific IVCD  Recent Labs: 05/02/2019: BUN 21; Creatinine, Ser 0.82; Hemoglobin 12.7; Platelets 187; Potassium 3.8; Sodium 136   Recent Lipid Panel Lab Results  Component Value Date/Time   CHOL  125 03/09/2014 05:17 PM   TRIG 111 03/09/2014 05:17 PM   HDL 29 (L) 03/09/2014 05:17 PM   CHOLHDL 4.3 03/09/2014 05:17 PM   LDLCALC 74 03/09/2014 05:17 PM    Wt Readings from Last 3 Encounters:  05/04/19 (!) 365 lb (165.6 kg)  05/02/19 (!) 345 lb (156.5 kg)  12/03/18 (!) 360 lb (163.3 kg)     Objective:    Vital Signs:  BP (!) 192/80   Pulse 83   Ht 5\' 11"  (1.803 m)   Wt (!) 365 lb (165.6 kg)   BMI 50.91 kg/m    VITAL  SIGNS:  reviewed GEN:  no acute distress EYES:  sclerae anicteric, EOMI - Extraocular Movements Intact RESPIRATORY:  normal respiratory effort, symmetric expansion NEURO:  alert and oriented x 3, no obvious focal deficit PSYCH:  normal affect  ASSESSMENT & PLAN:    1.  Shortness of breath/acute CHF: While they did not check a BNP and the chest x-ray was nonspecific, his symptoms have improved to some degree with Lasix.  He appears to have some component of CHF but at the present time it is unclear if this is due to systolic or diastolic dysfunction. I will order a 2-D echocardiogram with Doppler to evaluate cardiac structure, function, and regional wall motion. For the time being continue Lasix 20 mg daily.  Given his morbid obesity, he may require a higher dose.  I will also aim to control blood pressure by starting losartan 25 mg daily.  2.  Hypertension: His blood pressure is markedly elevated.  He is only on hydrochlorothiazide 25 mg.  I will start losartan 25 mg daily and check a basic metabolic panel within the next few days as he is also on Lasix.  3.  Daytime somnolence: Given his morbid obesity and hypertension he very likely has obstructive sleep apnea.  He needs significant weight loss.  I will make a referral to a sleep specialist.  4.  Panic attacks: He had been taking Klonopin in the past.  He is trying to get it refilled by his PCP.  I recommended he speak with his PCP about getting referred to a behavioral health specialist.  COVID-19 Education:  The signs and symptoms of COVID-19 were discussed with the patient and how to seek care for testing (follow up with PCP or arrange E-visit).  The importance of social distancing was discussed today.  Time:   Today, I have spent 30 minutes with the patient with telehealth technology discussing the above problems.     Medication Adjustments/Labs and Tests Ordered: Current medicines are reviewed at length with the patient today.  Concerns regarding medicines are outlined above.   Tests Ordered: No orders of the defined types were placed in this encounter.   Medication Changes: No orders of the defined types were placed in this encounter.   Disposition:  Follow up in 2 month(s)  Signed, Kate Sable, MD  05/04/2019 3:33 PM    [facility]

## 2019-05-04 NOTE — Patient Instructions (Signed)
Medication Instructions:  Start losartan 25 mg daily   Labwork: bmet (Friday)   Testing/Procedures: Your physician has requested that you have an echocardiogram. Echocardiography is a painless test that uses sound waves to create images of your heart. It provides your doctor with information about the size and shape of your heart and how well your heart's chambers and valves are working. This procedure takes approximately one hour. There are no restrictions for this procedure.  You have been referred to sleep study    Follow-Up: Your physician recommends that you schedule a follow-up appointment in: 2 months    Any Other Special Instructions Will Be Listed Below (If Applicable).     If you need a refill on your cardiac medications before your next appointment, please call your pharmacy.

## 2019-05-04 NOTE — Addendum Note (Signed)
Addended by: Debbora Lacrosse R on: 05/04/2019 04:06 PM   Modules accepted: Orders

## 2019-05-11 ENCOUNTER — Ambulatory Visit (HOSPITAL_COMMUNITY)
Admission: RE | Admit: 2019-05-11 | Discharge: 2019-05-11 | Disposition: A | Discharge status: Home / Self Care | Payer: Medicare Other | Source: Ambulatory Visit | Attending: Cardiovascular Disease | Admitting: Cardiovascular Disease

## 2019-05-11 ENCOUNTER — Other Ambulatory Visit: Payer: Self-pay

## 2019-05-11 DIAGNOSIS — R0602 Shortness of breath: Secondary | ICD-10-CM | POA: Insufficient documentation

## 2019-05-11 NOTE — Progress Notes (Signed)
*  PRELIMINARY RESULTS* Echocardiogram 2D Echocardiogram has been performed.  [name] 05/11/2019, 3:57 PM

## 2019-05-13 ENCOUNTER — Telehealth: Payer: Self-pay | Admitting: Cardiovascular Disease

## 2019-05-13 NOTE — Telephone Encounter (Signed)
Patient Would like to speak to nurse regarding medication questions. / tg

## 2019-05-13 NOTE — Telephone Encounter (Signed)
Returned pt call, no answer- unable to leave message as mailbox is full.

## 2019-05-14 MED ORDER — FUROSEMIDE 20 MG PO TABS: 20.0000 mg | ORAL_TABLET | Freq: Every day | ORAL | 0 refills | Status: DC

## 2019-05-14 NOTE — Telephone Encounter (Signed)
Called pt., no answer- voicemail box is full- unable to leave message.

## 2019-05-14 NOTE — Telephone Encounter (Signed)
PT needs a refill on his lasix 30 mg. Gave him enough to get to appointment with DR. KONESWEARAN.

## 2019-06-03 ENCOUNTER — Telehealth: Payer: Self-pay

## 2019-06-03 DIAGNOSIS — I10 Essential (primary) hypertension: Secondary | ICD-10-CM

## 2019-06-03 DIAGNOSIS — G4730 Sleep apnea, unspecified: Secondary | ICD-10-CM

## 2019-06-03 NOTE — Telephone Encounter (Signed)
Had to change from referral to order.  

## 2019-07-09 ENCOUNTER — Ambulatory Visit: Payer: Medicare Other | Admitting: Cardiovascular Disease

## 2019-08-25 ENCOUNTER — Encounter (HOSPITAL_COMMUNITY): Payer: Self-pay

## 2019-08-25 ENCOUNTER — Other Ambulatory Visit: Payer: Self-pay

## 2019-08-25 ENCOUNTER — Emergency Department (HOSPITAL_COMMUNITY)
Admission: EM | Admit: 2019-08-25 | Discharge: 2019-08-25 | Disposition: A | Discharge status: Home / Self Care | Payer: Medicare Other | Attending: Emergency Medicine | Admitting: Emergency Medicine

## 2019-08-25 DIAGNOSIS — F909 Attention-deficit hyperactivity disorder, unspecified type: Secondary | ICD-10-CM | POA: Insufficient documentation

## 2019-08-25 DIAGNOSIS — M545 Low back pain, unspecified: Secondary | ICD-10-CM

## 2019-08-25 DIAGNOSIS — Z79899 Other long term (current) drug therapy: Secondary | ICD-10-CM | POA: Diagnosis not present

## 2019-08-25 DIAGNOSIS — F17210 Nicotine dependence, cigarettes, uncomplicated: Secondary | ICD-10-CM | POA: Insufficient documentation

## 2019-08-25 DIAGNOSIS — I10 Essential (primary) hypertension: Secondary | ICD-10-CM | POA: Insufficient documentation

## 2019-08-25 DIAGNOSIS — M5489 Other dorsalgia: Secondary | ICD-10-CM | POA: Diagnosis present

## 2019-08-25 MED ORDER — HYDROMORPHONE HCL 1 MG/ML IJ SOLN
1.0000 mg | Freq: Once | INTRAMUSCULAR | Status: AC
  Administered 2019-08-25: 10:00:00 1 mg via INTRAMUSCULAR
  Filled 2019-08-25: qty 1

## 2019-08-25 MED ORDER — KETOROLAC TROMETHAMINE 60 MG/2ML IM SOLN
60.0000 mg | Freq: Once | INTRAMUSCULAR | Status: AC
  Administered 2019-08-25: 60 mg via INTRAMUSCULAR
  Filled 2019-08-25: qty 2

## 2019-08-25 MED ORDER — METHYLPREDNISOLONE SODIUM SUCC 125 MG IJ SOLR
125.0000 mg | Freq: Once | INTRAMUSCULAR | Status: AC
  Administered 2019-08-25: 10:00:00 125 mg via INTRAMUSCULAR
  Filled 2019-08-25: qty 2

## 2019-08-25 NOTE — Discharge Instructions (Addendum)
See your Physician to discuss pain management

## 2019-08-25 NOTE — ED Triage Notes (Signed)
Pt reports he picked up a 5 gallon bucket of paint 2 night ago. Pain increased and located left side and goes down leg

## 2019-08-25 NOTE — ED Provider Notes (Signed)
[facility] EMERGENCY DEPARTMENT Provider Note   CSN: [id number] Arrival date & time: 08/25/19  0911     History   Chief Complaint Chief Complaint  Patient presents with   Back Pain    HPI [name] is a 43 y.o. male.     The history is provided by the patient. No language interpreter was used.  Back Pain Location:  Lumbar spine Quality:  Aching Radiates to:  L posterior upper leg Pain severity:  Severe Pain is:  Same all the time Progression:  Worsening Chronicity:  New Relieved by:  Nothing Worsened by:  Nothing Ineffective treatments:  None tried Associated symptoms: no paresthesias, no perianal numbness and no tingling   Risk factors: no recent surgery and no steroid use    Pt is in pain management on percocet.  Pt reports no relief from pain medication.  Past Medical History:  Diagnosis Date   ADHD (attention deficit hyperactivity disorder)    Anxiety    Arthritis    knee and back   Chronic back pain    Depression    GERD (gastroesophageal reflux disease)    Hypertension    Neuropathy    Obesity    PONV (postoperative nausea and vomiting)     Patient Active Problem List   Diagnosis Date Noted   Fall 11/07/2017   Skin rash 11/07/2017   Spinal stenosis, lumbar region, with neurogenic claudication 08/06/2016    Class: Chronic   DDD (degenerative disc disease), lumbar 08/06/2016    Class: Chronic   Lumbar disc herniation with radiculopathy 08/06/2016    Class: Chronic   Neurogenic claudication due to lumbar spinal stenosis 08/06/2016   Vitamin D deficiency 06/30/2014   Right knee pain 06/30/2014   Essential hypertension, benign 03/09/2014   Anxiety and depression 03/09/2014   Neuropathic pain 03/09/2014   Smoking 03/09/2014   Hemorrhoid 03/09/2014   GERD (gastroesophageal reflux disease) 03/09/2014    Past Surgical History:  Procedure Laterality Date   BACK SURGERY     CARPAL TUNNEL RELEASE Left    HAND  SURGERY     KNEE SURGERY Right    TONSILLECTOMY          Home Medications    Prior to Admission medications   Medication Sig Start Date End Date Taking? Authorizing Provider  acetaminophen (TYLENOL) 500 MG tablet Take 1,000 mg by mouth every 6 (six) hours as needed for moderate pain.    [provider]  amphetamine-dextroamphetamine (ADDERALL) 30 MG tablet Take 1 tablet 2 (two) times daily by mouth. 09/13/17   [provider]  celecoxib (CELEBREX) 200 MG capsule TAKE ONE CAPSULE BY MOUTH TWICE DAILY 05/04/18   Kerrin Champagne, MD  cetirizine (ZYRTEC) 10 MG tablet Take 1 tablet (10 mg total) by mouth daily. 11/23/17   Cathie Hoops, Amy V, PA-C  clonazePAM (KLONOPIN) 0.5 MG tablet Take 1 TABLET BY MOUTH at 8 IN THE MORNING and at 8 pm 09/10/17   [provider]  cyclobenzaprine (FLEXERIL) 10 MG tablet Take 1 tablet (10 mg total) by mouth 3 (three) times daily as needed for muscle spasms. 10/29/17   Beola Cord, MD  diclofenac sodium (VOLTAREN) 1 % GEL Apply 4 g topically 4 (four) times daily. 09/17/17   Joy, Shawn C, PA-C  doxycycline (VIBRAMYCIN) 100 MG capsule Take 1 capsule (100 mg total) by mouth 2 (two) times daily. 05/02/19   Rancour, Jeannett Senior, MD  fluticasone (FLONASE) 50 MCG/ACT nasal spray Place 2 sprays into  both nostrils daily. 11/23/17   Cathie HoopsYu, Amy V, PA-C  furosemide (LASIX) 20 MG tablet Take 1 tablet (20 mg total) by mouth daily. 05/14/19   Laqueta LindenKoneswaran, Suresh A, MD  gabapentin (NEURONTIN) 800 MG tablet Take 1 tablet (800 mg total) by mouth 2 (two) times daily. 11/24/17   Kerrin ChampagneNitka, James E, MD  gabapentin (NEURONTIN) 800 MG tablet  03/15/19   [provider]  gemfibrozil (LOPID) 600 MG tablet Take 600 mg by mouth 2 (two) times daily. 05/16/15   [provider]  hydrochlorothiazide (HYDRODIURIL) 25 MG tablet  03/15/19   [provider]  ibuprofen (ADVIL,MOTRIN) 200 MG tablet Take 600 mg by mouth.    [provider]  losartan (COZAAR)  25 MG tablet Take 1 tablet (25 mg total) by mouth daily. 05/04/19 08/02/19  Laqueta LindenKoneswaran, Suresh A, MD  metoprolol succinate (TOPROL-XL) 25 MG 24 hr tablet Take 1 tablet (25 mg total) by mouth 2 (two) times daily. Take 1 tablet BID for hypertention Patient not taking: Reported on 05/04/2019 09/01/17   Kerrin ChampagneNitka, James E, MD  omeprazole (PRILOSEC) 20 MG capsule TAKE ONE CAPSULE BY MOUTH EVERY DAY 05/04/18   Beola CordMelvin, Alexander, MD  omeprazole (PRILOSEC) 40 MG capsule take 1 capsule (40 mg) by oral route once daily before a meal for 90 days 03/15/19   [provider]  oxyCODONE-acetaminophen (PERCOCET) 10-325 MG tablet Take 1 tablet by mouth 3 (three) times daily.    [provider]  PARoxetine (PAXIL) 10 MG tablet Take 10 mg by mouth daily. 03/31/19   [provider]    Family History Family History  Problem Relation Age of Onset   Heart failure Mother    Heart disease Mother    Diabetes Father    Stroke Brother     Social History Social History   Tobacco Use   Smoking status: Current Every Day Smoker    Packs/day: 0.50    Years: 18.00    Pack years: 9.00    Types: Cigarettes   Smokeless tobacco: Never Used  Substance Use Topics   Alcohol use: No    Comment: quit 2009   Drug use: No     Allergies   Tramadol and Vicodin [hydrocodone-acetaminophen]   Review of Systems Review of Systems  Musculoskeletal: Positive for back pain.  Neurological: Negative for tingling and paresthesias.  All other systems reviewed and are negative.    Physical Exam Updated Vital Signs BP (!) 143/96    Pulse 90    Temp 98.7 F (37.1 C) (Oral)    Resp 18    Ht 5\' 11"  (1.803 m)    Wt (!) 163.3 kg    SpO2 100%    BMI 50.21 kg/m   Physical Exam Vitals signs and nursing note reviewed.  Constitutional:      Appearance: He is well-developed.  HENT:     Head: Normocephalic and atraumatic.  Eyes:     Conjunctiva/sclera: Conjunctivae normal.  Neck:     Musculoskeletal: Neck  supple.  Cardiovascular:     Rate and Rhythm: Normal rate and regular rhythm.     Heart sounds: No murmur.  Pulmonary:     Effort: Pulmonary effort is normal. No respiratory distress.     Breath sounds: Normal breath sounds.  Abdominal:     Palpations: Abdomen is soft.     Tenderness: There is no abdominal tenderness.  Musculoskeletal: Normal range of motion.  Skin:    General: Skin is warm and dry.  Neurological:     General: No focal deficit present.     Mental Status: He is alert.  Psychiatric:        Mood and Affect: Mood normal.      ED Treatments / Results  Labs (all labs ordered are listed, but only abnormal results are displayed) Labs Reviewed - No data to display  EKG None  Radiology No results found.  Procedures Procedures (including critical care time)  Medications Ordered in ED Medications  ketorolac (TORADOL) injection 60 mg (has no administration in time range)  methylPREDNISolone sodium succinate (SOLU-MEDROL) 125 mg/2 mL injection 125 mg (has no administration in time range)  HYDROmorphone (DILAUDID) injection 1 mg (has no administration in time range)     Initial Impression / Assessment and Plan / ED Course  I have reviewed the triage vital signs and the nursing notes.  Pertinent labs & imaging results that were available during my care of the patient were reviewed by me and considered in my medical decision making (see chart for details).        MDM  Pt's reports reviewed.  Pt given solumedrol, torodol and dilaudid for pain.  Pt advised he has to contact his pain mangement practice to discuss on going pain medication.    Final Clinical Impressions(s) / ED Diagnoses   Final diagnoses:  Acute low back pain without sciatica, unspecified back pain laterality    ED Discharge Orders    None    An After Visit Summary was printed and given to the patient.    Fransico Meadow, [place] 08/25/19 0957    Margette Fast, MD 08/26/19 2018

## 2019-09-10 ENCOUNTER — Other Ambulatory Visit: Payer: Self-pay | Admitting: Anesthesiology

## 2019-09-10 ENCOUNTER — Other Ambulatory Visit (HOSPITAL_COMMUNITY): Payer: Self-pay | Admitting: Anesthesiology

## 2019-09-10 ENCOUNTER — Other Ambulatory Visit: Payer: Self-pay | Admitting: Emergency Medicine

## 2019-09-10 ENCOUNTER — Other Ambulatory Visit (HOSPITAL_COMMUNITY): Payer: Self-pay | Admitting: Emergency Medicine

## 2019-09-10 DIAGNOSIS — M5417 Radiculopathy, lumbosacral region: Secondary | ICD-10-CM

## 2019-09-25 ENCOUNTER — Ambulatory Visit (HOSPITAL_COMMUNITY): Payer: Medicare Other | Attending: Anesthesiology

## 2019-09-25 ENCOUNTER — Encounter (HOSPITAL_COMMUNITY): Payer: Self-pay

## 2020-01-18 ENCOUNTER — Other Ambulatory Visit: Payer: Self-pay | Admitting: Cardiovascular Disease

## 2020-06-19 ENCOUNTER — Other Ambulatory Visit: Payer: Self-pay

## 2020-06-20 ENCOUNTER — Emergency Department (HOSPITAL_COMMUNITY)
Admission: EM | Admit: 2020-06-20 | Discharge: 2020-06-20 | Discharge status: Against Medical Advice | Payer: Medicare Other

## 2022-05-09 ENCOUNTER — Encounter: Payer: Self-pay | Admitting: Emergency Medicine

## 2022-05-09 ENCOUNTER — Ambulatory Visit
Admission: EM | Admit: 2022-05-09 | Discharge: 2022-05-09 | Disposition: A | Discharge status: Home / Self Care | Payer: Medicare Other

## 2022-05-09 DIAGNOSIS — R791 Abnormal coagulation profile: Secondary | ICD-10-CM | POA: Insufficient documentation

## 2022-05-09 DIAGNOSIS — R748 Abnormal levels of other serum enzymes: Secondary | ICD-10-CM | POA: Insufficient documentation

## 2022-05-09 DIAGNOSIS — Z79899 Other long term (current) drug therapy: Secondary | ICD-10-CM | POA: Insufficient documentation

## 2022-05-09 DIAGNOSIS — L03115 Cellulitis of right lower limb: Secondary | ICD-10-CM | POA: Insufficient documentation

## 2022-05-09 DIAGNOSIS — D72829 Elevated white blood cell count, unspecified: Secondary | ICD-10-CM | POA: Insufficient documentation

## 2022-05-09 NOTE — ED Notes (Signed)
Patient is being discharged from the Urgent Care and sent to the Emergency Department via private vehicle . Per NP, patient is in need of higher level of care due to right lower leg redness and swelling. Patient is aware and verbalizes understanding of plan of care.  Vitals:   05/09/22 1951  BP: 132/87  Pulse: (!) 116  Resp: 18  Temp: 98.6 F (37 C)  SpO2: 94%

## 2022-05-09 NOTE — ED Triage Notes (Signed)
Right lower leg redness and swelling x 3 days.  States he was getting dressed and felt something pop in leg.

## 2022-05-10 ENCOUNTER — Encounter (HOSPITAL_COMMUNITY): Payer: Self-pay

## 2022-05-10 ENCOUNTER — Other Ambulatory Visit: Payer: Self-pay

## 2022-05-10 ENCOUNTER — Encounter (HOSPITAL_COMMUNITY): Payer: Self-pay | Admitting: Emergency Medicine

## 2022-05-10 ENCOUNTER — Emergency Department (HOSPITAL_COMMUNITY)
Admission: EM | Admit: 2022-05-10 | Discharge: 2022-05-10 | Disposition: A | Discharge status: Home / Self Care | Payer: Medicare Other | Source: Home / Self Care | Attending: Emergency Medicine | Admitting: Emergency Medicine

## 2022-05-10 DIAGNOSIS — L03115 Cellulitis of right lower limb: Secondary | ICD-10-CM

## 2022-05-10 LAB — CBC WITH DIFFERENTIAL/PLATELET
Abs Immature Granulocytes: 0.1 10*3/uL — ABNORMAL HIGH (ref 0.00–0.07)
Basophils Absolute: 0 10*3/uL (ref 0.0–0.1)
Basophils Relative: 0 %
Eosinophils Absolute: 0 10*3/uL (ref 0.0–0.5)
Eosinophils Relative: 0 %
HCT: 35.4 % — ABNORMAL LOW (ref 39.0–52.0)
Hemoglobin: 12.1 g/dL — ABNORMAL LOW (ref 13.0–17.0)
Immature Granulocytes: 1 %
Lymphocytes Relative: 7 %
Lymphs Abs: 0.9 10*3/uL (ref 0.7–4.0)
MCH: 30 pg (ref 26.0–34.0)
MCHC: 34.2 g/dL (ref 30.0–36.0)
MCV: 87.6 fL (ref 80.0–100.0)
Monocytes Absolute: 0.8 10*3/uL (ref 0.1–1.0)
Monocytes Relative: 6 %
Neutro Abs: 12.2 10*3/uL — ABNORMAL HIGH (ref 1.7–7.7)
Neutrophils Relative %: 86 %
Platelets: 156 10*3/uL (ref 150–400)
RBC: 4.04 MIL/uL — ABNORMAL LOW (ref 4.22–5.81)
RDW: 12.7 % (ref 11.5–15.5)
WBC: 14.1 10*3/uL — ABNORMAL HIGH (ref 4.0–10.5)
nRBC: 0 % (ref 0.0–0.2)

## 2022-05-10 LAB — D-DIMER, QUANTITATIVE: D-Dimer, Quant: 1.61 ug/mL-FEU — ABNORMAL HIGH (ref 0.00–0.50)

## 2022-05-10 LAB — BASIC METABOLIC PANEL
Anion gap: 9 (ref 5–15)
BUN: 34 mg/dL — ABNORMAL HIGH (ref 6–20)
CO2: 28 mmol/L (ref 22–32)
Calcium: 8.7 mg/dL — ABNORMAL LOW (ref 8.9–10.3)
Chloride: 96 mmol/L — ABNORMAL LOW (ref 98–111)
Creatinine, Ser: 1.25 mg/dL — ABNORMAL HIGH (ref 0.61–1.24)
GFR, Estimated: >60 mL/min (ref >60)
Glucose, Bld: 138 mg/dL — ABNORMAL HIGH (ref 70–99)
Potassium: 4 mmol/L (ref 3.5–5.1)
Sodium: 133 mmol/L — ABNORMAL LOW (ref 135–145)

## 2022-05-10 MED ORDER — DOXYCYCLINE HYCLATE 100 MG PO CAPS: 100.0000 mg | ORAL_CAPSULE | Freq: Two times a day (BID) | ORAL | 0 refills | Status: DC

## 2022-05-10 MED ORDER — ENOXAPARIN SODIUM 300 MG/3ML IJ SOLN
1.0000 mg/kg | Freq: Once | INTRAMUSCULAR | Status: AC
  Administered 2022-05-10: 165 mg via SUBCUTANEOUS
  Filled 2022-05-10: qty 1.65

## 2022-05-10 MED ORDER — CEFTRIAXONE SODIUM 1 G IJ SOLR
1.0000 g | Freq: Once | INTRAMUSCULAR | Status: AC
  Administered 2022-05-10: 1 g via INTRAMUSCULAR
  Filled 2022-05-10: qty 10

## 2022-05-10 MED ORDER — OXYCODONE-ACETAMINOPHEN 5-325 MG PO TABS: 1.0000 | ORAL_TABLET | Freq: Four times a day (QID) | ORAL | 0 refills | Status: DC | PRN

## 2022-05-10 MED ORDER — SODIUM CHLORIDE 0.9 % IV SOLN
1.0000 g | Freq: Once | INTRAVENOUS | Status: DC
  Filled 2022-05-10: qty 10

## 2022-05-10 NOTE — ED Provider Notes (Signed)
[facility] EMERGENCY DEPARTMENT  Provider Note  CSN: [id number] Arrival date & time: 05/09/22 2315  History Chief Complaint  Patient presents with   Leg Swelling    [name] is a 46 y.o. male presents for evaluation of three days of progressive redness and swelling of R leg. States he stretched a few days ago and felt a pop. He has not had a fever. No CP or SOB.    Home Medications Prior to Admission medications   Medication Sig Start Date End Date Taking? Authorizing Provider  doxycycline (VIBRAMYCIN) 100 MG capsule Take 1 capsule (100 mg total) by mouth 2 (two) times daily. 05/10/22  Yes Truddie Hidden, MD  oxyCODONE-acetaminophen (PERCOCET/ROXICET) 5-325 MG tablet Take 1 tablet by mouth every 6 (six) hours as needed for severe pain. 05/10/22  Yes Truddie Hidden, MD  acetaminophen (TYLENOL) 500 MG tablet Take 1,000 mg by mouth every 6 (six) hours as needed for moderate pain.    [provider]  amphetamine-dextroamphetamine (ADDERALL) 30 MG tablet Take 1 tablet 2 (two) times daily by mouth. 09/13/17   [provider]  celecoxib (CELEBREX) 200 MG capsule TAKE ONE CAPSULE BY MOUTH TWICE DAILY 05/04/18   Jessy Oto, MD  cetirizine (ZYRTEC) 10 MG tablet Take 1 tablet (10 mg total) by mouth daily. 11/23/17   Tasia Catchings, Amy V, PA-C  clonazePAM (KLONOPIN) 0.5 MG tablet Take 1 TABLET BY MOUTH at 8 IN THE MORNING and at 8 pm 09/10/17   [provider]  cyclobenzaprine (FLEXERIL) 10 MG tablet Take 1 tablet (10 mg total) by mouth 3 (three) times daily as needed for muscle spasms. 10/29/17   Marcelyn Bruins, MD  diclofenac sodium (VOLTAREN) 1 % GEL Apply 4 g topically 4 (four) times daily. 09/17/17   Joy, Shawn C, PA-C  fluticasone (FLONASE) 50 MCG/ACT nasal spray Place 2 sprays into both nostrils daily. 11/23/17   Tasia Catchings, Amy V, PA-C  furosemide (LASIX) 20 MG tablet Take 1 tablet (20 mg total) by mouth daily. 05/14/19   Herminio Commons, MD  gabapentin  (NEURONTIN) 800 MG tablet Take 1 tablet (800 mg total) by mouth 2 (two) times daily. 11/24/17   Jessy Oto, MD  gabapentin (NEURONTIN) 800 MG tablet  03/15/19   [provider]  gemfibrozil (LOPID) 600 MG tablet Take 600 mg by mouth 2 (two) times daily. 05/16/15   [provider]  hydrochlorothiazide (HYDRODIURIL) 25 MG tablet  03/15/19   [provider]  ibuprofen (ADVIL,MOTRIN) 200 MG tablet Take 600 mg by mouth.    [provider]  losartan (COZAAR) 25 MG tablet Take 1 tablet (25 mg total) by mouth daily. 01/18/20 04/17/20  Herminio Commons, MD  metoprolol succinate (TOPROL-XL) 25 MG 24 hr tablet Take 1 tablet (25 mg total) by mouth 2 (two) times daily. Take 1 tablet BID for hypertention Patient not taking: Reported on 05/04/2019 09/01/17   Jessy Oto, MD  omeprazole (PRILOSEC) 20 MG capsule TAKE ONE CAPSULE BY MOUTH EVERY DAY 05/04/18   Marcelyn Bruins, MD  omeprazole (PRILOSEC) 40 MG capsule take 1 capsule (40 mg) by oral route once daily before a meal for 90 days 03/15/19   [provider]  PARoxetine (PAXIL) 10 MG tablet Take 10 mg by mouth daily. 03/31/19   [provider]     Allergies    Tramadol and Vicodin [hydrocodone-acetaminophen]   Review of Systems   Review of Systems Please see HPI  for pertinent positives and negatives  Physical Exam BP 132/77   Pulse 92   Temp 99.6 F (37.6 C)   Resp 18   Ht 5\' 11"  (1.803 m)   Wt (!) 163.3 kg   SpO2 100%   BMI 50.21 kg/m   Physical Exam Vitals and nursing note reviewed.  Constitutional:      Appearance: Normal appearance.  HENT:     Head: Normocephalic and atraumatic.     Nose: Nose normal.     Mouth/Throat:     Mouth: Mucous membranes are moist.  Eyes:     Extraocular Movements: Extraocular movements intact.     Conjunctiva/sclera: Conjunctivae normal.  Cardiovascular:     Rate and Rhythm: Normal rate.  Pulmonary:     Effort: Pulmonary effort is normal.      Breath sounds: Normal breath sounds.  Abdominal:     General: Abdomen is flat.     Palpations: Abdomen is soft.     Tenderness: There is no abdominal tenderness.  Musculoskeletal:        General: Swelling present. Normal range of motion.     Cervical back: Neck supple.     Comments: Marked erythema and induration to RLE compared to LLE, extends to medial L thigh where there are palpable cords over deep veins.   Skin:    General: Skin is warm and dry.  Neurological:     General: No focal deficit present.     Mental Status: He is alert.  Psychiatric:        Mood and Affect: Mood normal.      ED Results / Procedures / Treatments   EKG None  Procedures Procedures  Medications Ordered in the ED Medications  enoxaparin (LOVENOX) 100 mg/mL injection 165 mg (has no administration in time range)  cefTRIAXone (ROCEPHIN) 1 g in sodium chloride 0.9 % 100 mL IVPB (has no administration in time range)    Initial Impression and Plan  Patient with RLE redness and swelling. Suspect cellulitis vs DVT. Will check labs to help differentiate. Vitals are unremarkable. No signs of sepsis.   ED Course   Clinical Course as of 05/10/22 0204  Fri May 10, 2022  0117 CBC with leukocytosis.  [CS]  0128 BMP with mild increase in Cr from baseline.  [CS]  0158 Dimer is moderately elevated. Discussed admission with the patient for IV abx and anticoagulation pending 0118 availability however he states he needs to go home and cannot stay. He would like to come back in the morning for [place]. Will give a dose of Lovenox now, Abx for cellulitis. Rx for pain meds as needed.  [CS]    Clinical Course User Index [CS] [place], MD     MDM Rules/Calculators/A&P Medical Decision Making Problems Addressed: Cellulitis of right lower extremity: acute illness or injury  Amount and/or Complexity of Data Reviewed Labs: ordered. Decision-making details documented in ED Course.  Risk Prescription drug  management. Decision regarding hospitalization.    Final Clinical Impression(s) / ED Diagnoses Final diagnoses:  Cellulitis of right lower extremity    Rx / DC Orders ED Discharge Orders          Ordered    oxyCODONE-acetaminophen (PERCOCET/ROXICET) 5-325 MG tablet  Every 6 hours PRN        05/10/22 0202    doxycycline (VIBRAMYCIN) 100 MG capsule  2 times daily        05/10/22 0202    05/12/22 Venous Img Lower Unilateral  Right        05/10/22 0203             Pollyann Savoy, MD 05/10/22 [phone]

## 2022-05-10 NOTE — ED Triage Notes (Signed)
Pt arrived from home with complaints of right leg pain and swelling x 3 days. States no injury has occurred to leg.

## 2022-05-13 ENCOUNTER — Other Ambulatory Visit: Payer: Self-pay

## 2022-05-13 ENCOUNTER — Emergency Department (HOSPITAL_COMMUNITY): Payer: Medicare Other

## 2022-05-13 ENCOUNTER — Inpatient Hospital Stay (HOSPITAL_COMMUNITY)
Admission: EM | Admit: 2022-05-13 | Discharge: 2022-05-13 | DRG: 603 | Discharge status: Against Medical Advice | Payer: Medicare Other | Attending: Internal Medicine | Admitting: Internal Medicine

## 2022-05-13 ENCOUNTER — Encounter (HOSPITAL_COMMUNITY): Payer: Self-pay

## 2022-05-13 DIAGNOSIS — K219 Gastro-esophageal reflux disease without esophagitis: Secondary | ICD-10-CM | POA: Diagnosis present

## 2022-05-13 DIAGNOSIS — Z886 Allergy status to analgesic agent status: Secondary | ICD-10-CM | POA: Diagnosis not present

## 2022-05-13 DIAGNOSIS — Z791 Long term (current) use of non-steroidal anti-inflammatories (NSAID): Secondary | ICD-10-CM

## 2022-05-13 DIAGNOSIS — E871 Hypo-osmolality and hyponatremia: Secondary | ICD-10-CM | POA: Diagnosis present

## 2022-05-13 DIAGNOSIS — L0390 Cellulitis, unspecified: Secondary | ICD-10-CM | POA: Diagnosis present

## 2022-05-13 DIAGNOSIS — F909 Attention-deficit hyperactivity disorder, unspecified type: Secondary | ICD-10-CM | POA: Diagnosis present

## 2022-05-13 DIAGNOSIS — Z885 Allergy status to narcotic agent status: Secondary | ICD-10-CM | POA: Diagnosis not present

## 2022-05-13 DIAGNOSIS — Z8249 Family history of ischemic heart disease and other diseases of the circulatory system: Secondary | ICD-10-CM | POA: Diagnosis not present

## 2022-05-13 DIAGNOSIS — F17210 Nicotine dependence, cigarettes, uncomplicated: Secondary | ICD-10-CM | POA: Diagnosis present

## 2022-05-13 DIAGNOSIS — Z79899 Other long term (current) drug therapy: Secondary | ICD-10-CM | POA: Diagnosis not present

## 2022-05-13 DIAGNOSIS — F419 Anxiety disorder, unspecified: Secondary | ICD-10-CM | POA: Diagnosis present

## 2022-05-13 DIAGNOSIS — F32A Depression, unspecified: Secondary | ICD-10-CM | POA: Diagnosis present

## 2022-05-13 DIAGNOSIS — E876 Hypokalemia: Secondary | ICD-10-CM | POA: Diagnosis present

## 2022-05-13 DIAGNOSIS — G629 Polyneuropathy, unspecified: Secondary | ICD-10-CM | POA: Diagnosis present

## 2022-05-13 DIAGNOSIS — Z6841 Body Mass Index (BMI) 40.0 and over, adult: Secondary | ICD-10-CM | POA: Diagnosis not present

## 2022-05-13 DIAGNOSIS — L03115 Cellulitis of right lower limb: Principal | ICD-10-CM | POA: Diagnosis present

## 2022-05-13 DIAGNOSIS — I10 Essential (primary) hypertension: Secondary | ICD-10-CM | POA: Diagnosis present

## 2022-05-13 DIAGNOSIS — N179 Acute kidney failure, unspecified: Secondary | ICD-10-CM | POA: Diagnosis present

## 2022-05-13 DIAGNOSIS — E8809 Other disorders of plasma-protein metabolism, not elsewhere classified: Secondary | ICD-10-CM | POA: Diagnosis present

## 2022-05-13 LAB — COMPREHENSIVE METABOLIC PANEL
ALT: 30 U/L (ref 0–44)
AST: 45 U/L — ABNORMAL HIGH (ref 15–41)
Albumin: 2.5 g/dL — ABNORMAL LOW (ref 3.5–5.0)
Alkaline Phosphatase: 140 U/L — ABNORMAL HIGH (ref 38–126)
Anion gap: 10 (ref 5–15)
BUN: 35 mg/dL — ABNORMAL HIGH (ref 6–20)
CO2: 25 mmol/L (ref 22–32)
Calcium: 8.8 mg/dL — ABNORMAL LOW (ref 8.9–10.3)
Chloride: 97 mmol/L — ABNORMAL LOW (ref 98–111)
Creatinine, Ser: 1.36 mg/dL — ABNORMAL HIGH (ref 0.61–1.24)
GFR, Estimated: >60 mL/min (ref >60)
Glucose, Bld: 210 mg/dL — ABNORMAL HIGH (ref 70–99)
Potassium: 2.8 mmol/L — ABNORMAL LOW (ref 3.5–5.1)
Sodium: 132 mmol/L — ABNORMAL LOW (ref 135–145)
Total Bilirubin: 0.5 mg/dL (ref 0.3–1.2)
Total Protein: 7.7 g/dL (ref 6.5–8.1)

## 2022-05-13 LAB — CBC
HCT: 38.9 % — ABNORMAL LOW (ref 39.0–52.0)
Hemoglobin: 13.5 g/dL (ref 13.0–17.0)
MCH: 29.7 pg (ref 26.0–34.0)
MCHC: 34.7 g/dL (ref 30.0–36.0)
MCV: 85.5 fL (ref 80.0–100.0)
Platelets: 315 10*3/uL (ref 150–400)
RBC: 4.55 MIL/uL (ref 4.22–5.81)
RDW: 13.5 % (ref 11.5–15.5)
WBC: 16.4 10*3/uL — ABNORMAL HIGH (ref 4.0–10.5)
nRBC: 0 % (ref 0.0–0.2)

## 2022-05-13 LAB — LACTIC ACID, PLASMA: Lactic Acid, Venous: 2 mmol/L (ref 0.5–1.9)

## 2022-05-13 LAB — MAGNESIUM: Magnesium: 1.9 mg/dL (ref 1.7–2.4)

## 2022-05-13 MED ORDER — POTASSIUM CHLORIDE CRYS ER 20 MEQ PO TBCR: 40.0000 meq | EXTENDED_RELEASE_TABLET | Freq: Once | ORAL | Status: DC

## 2022-05-13 MED ORDER — LACTATED RINGERS IV BOLUS: 1000.0000 mL | Freq: Once | INTRAVENOUS | Status: DC

## 2022-05-13 MED ORDER — SODIUM CHLORIDE 0.9 % IV SOLN: 2.0000 g | Freq: Once | INTRAVENOUS | Status: DC

## 2022-05-13 MED ORDER — POTASSIUM CHLORIDE 10 MEQ/100ML IV SOLN: 10.0000 meq | INTRAVENOUS | Status: DC

## 2022-05-13 MED ORDER — VANCOMYCIN HCL IN DEXTROSE 1-5 GM/200ML-% IV SOLN: 1000.0000 mg | Freq: Once | INTRAVENOUS | Status: DC

## 2022-05-13 NOTE — H&P (Signed)
Consult Note   [name]:[id number] DOB: 14-Feb-1976 DOA: 05/13/2022  PCP: Pcp, No   Patient coming from: Home  Chief Complaint: Right lower extremity cellulitis  HPI: [name] is a 46 y.o. male with medical history significant for ADHD, anxiety/depression, GERD, hypertension, neuropathy, and morbid obesity who initially presented to the ED 6/16 with right lower extremity cellulitis and was offered admission at that time, but refused and was discharged home with doxycycline and plans for outpatient ultrasound of his lower extremity.  His symptoms actually began on 6/14 with swelling and pain and he has been taking doxycycline and Percocet, but now is amenable to hospitalization.  He denies any fevers or chills.  No chest pain, shortness of breath, abdominal pain, nausea, or vomiting noted.   ED Course: Vital signs with some noted tachycardia and laboratory data with leukocytosis at 16,000.  Potassium 2.8 and sodium 132 with creatinine 1.36 and glucose 210 lactic acid level is 2.  Right lower extremity DVT study is negative for any acute findings.  He has been given IV fluid as well as cefepime and vancomycin in the ED with blood cultures pending.  Review of Systems: Reviewed as noted above, otherwise negative.  Past Medical History:  Diagnosis Date   ADHD (attention deficit hyperactivity disorder)    Anxiety    Arthritis    knee and back   Chronic back pain    Depression    GERD (gastroesophageal reflux disease)    Hypertension    Neuropathy    Obesity    PONV (postoperative nausea and vomiting)     Past Surgical History:  Procedure Laterality Date   BACK SURGERY     CARPAL TUNNEL RELEASE Left    HAND SURGERY     KNEE SURGERY Right    TONSILLECTOMY       reports that he has been smoking cigarettes. He has a 9.00 pack-year smoking history. He has never used smokeless tobacco. He reports that he does not drink alcohol and does not use drugs.  Allergies   Allergen Reactions   Tramadol Nausea And Vomiting   Vicodin [Hydrocodone-Acetaminophen] Nausea And Vomiting    Family History  Problem Relation Age of Onset   Heart failure Mother    Heart disease Mother    Diabetes Father    Stroke Brother     Prior to Admission medications   Medication Sig Start Date End Date Taking? Authorizing Provider  acetaminophen (TYLENOL) 500 MG tablet Take 1,000 mg by mouth every 6 (six) hours as needed for moderate pain.    [provider]  amphetamine-dextroamphetamine (ADDERALL) 30 MG tablet Take 1 tablet 2 (two) times daily by mouth. 09/13/17   [provider]  celecoxib (CELEBREX) 200 MG capsule TAKE ONE CAPSULE BY MOUTH TWICE DAILY 05/04/18   Kerrin Champagne, MD  cetirizine (ZYRTEC) 10 MG tablet Take 1 tablet (10 mg total) by mouth daily. 11/23/17   Cathie Hoops, Amy V, PA-C  clonazePAM (KLONOPIN) 0.5 MG tablet Take 1 TABLET BY MOUTH at 8 IN THE MORNING and at 8 pm 09/10/17   [provider]  cyclobenzaprine (FLEXERIL) 10 MG tablet Take 1 tablet (10 mg total) by mouth 3 (three) times daily as needed for muscle spasms. 10/29/17   Synetta Fail, MD  diclofenac sodium (VOLTAREN) 1 % GEL Apply 4 g topically 4 (four) times daily. 09/17/17   Joy, Shawn C, PA-C  doxycycline (VIBRAMYCIN) 100 MG capsule Take 1 capsule (100 mg total)  by mouth 2 (two) times daily. 05/10/22   Pollyann Savoy, MD  fluticasone (FLONASE) 50 MCG/ACT nasal spray Place 2 sprays into both nostrils daily. 11/23/17   Cathie Hoops, Amy V, PA-C  furosemide (LASIX) 20 MG tablet Take 1 tablet (20 mg total) by mouth daily. 05/14/19   Laqueta Linden, MD  gabapentin (NEURONTIN) 800 MG tablet Take 1 tablet (800 mg total) by mouth 2 (two) times daily. 11/24/17   Kerrin Champagne, MD  gabapentin (NEURONTIN) 800 MG tablet  03/15/19   [provider]  gemfibrozil (LOPID) 600 MG tablet Take 600 mg by mouth 2 (two) times daily. 05/16/15   [provider]  hydrochlorothiazide  (HYDRODIURIL) 25 MG tablet  03/15/19   [provider]  ibuprofen (ADVIL,MOTRIN) 200 MG tablet Take 600 mg by mouth.    [provider]  losartan (COZAAR) 25 MG tablet Take 1 tablet (25 mg total) by mouth daily. 01/18/20 04/17/20  Laqueta Linden, MD  metoprolol succinate (TOPROL-XL) 25 MG 24 hr tablet Take 1 tablet (25 mg total) by mouth 2 (two) times daily. Take 1 tablet BID for hypertention Patient not taking: Reported on 05/04/2019 09/01/17   Kerrin Champagne, MD  omeprazole (PRILOSEC) 20 MG capsule TAKE ONE CAPSULE BY MOUTH EVERY DAY Patient taking differently: Take 20 mg by mouth daily. 05/04/18   Synetta Fail, MD  omeprazole (PRILOSEC) 40 MG capsule take 1 capsule (40 mg) by oral route once daily before a meal for 90 days 03/15/19   [provider]  oxyCODONE-acetaminophen (PERCOCET/ROXICET) 5-325 MG tablet Take 1 tablet by mouth every 6 (six) hours as needed for severe pain. 05/10/22   Pollyann Savoy, MD  PARoxetine (PAXIL) 10 MG tablet Take 10 mg by mouth daily. 03/31/19   [provider]    Physical Exam: Vitals:   05/13/22 1029 05/13/22 1030  BP: (!) 144/86   Pulse: (!) 102   Resp: 18   Temp: 97.6 F (36.4 C)   TempSrc: Oral   SpO2: 98%   Weight:  (!) 163.3 kg  Height:  5\' 11"  (1.803 m)    Constitutional: NAD, calm, comfortable Vitals:   05/13/22 1029 05/13/22 1030  BP: (!) 144/86   Pulse: (!) 102   Resp: 18   Temp: 97.6 F (36.4 C)   TempSrc: Oral   SpO2: 98%   Weight:  (!) 163.3 kg  Height:  5\' 11"  (1.803 m)   Eyes: lids and conjunctivae normal Neck: normal, supple Respiratory: clear to auscultation bilaterally. Normal respiratory effort. No accessory muscle use.  Cardiovascular: Regular rate and rhythm, no murmurs. Abdomen: no tenderness, no distention. Bowel sounds positive.  Musculoskeletal: Edema as noted below Skin: Right lower extremity erythema and swelling noted   Psychiatric: Flat affect  Labs on Admission:  I have personally reviewed following labs and imaging studies  CBC: Recent Labs  Lab 05/10/22 0055 05/13/22 1112  WBC 14.1* 16.4*  NEUTROABS 12.2*  --   HGB 12.1* 13.5  HCT 35.4* 38.9*  MCV 87.6 85.5  PLT 156 315   Basic Metabolic Panel: Recent Labs  Lab 05/10/22 0055 05/13/22 1112  NA 133* 132*  K 4.0 2.8*  CL 96* 97*  CO2 28 25  GLUCOSE 138* 210*  BUN 34* 35*  CREATININE 1.25* 1.36*  CALCIUM 8.7* 8.8*   GFR: Estimated Creatinine Clearance: 107.2 mL/min (A) (by C-G formula based on SCr of 1.36 mg/dL (H)). Liver Function Tests: Recent Labs  Lab 05/13/22 1112  AST 45*  ALT 30  ALKPHOS 140*  BILITOT 0.5  PROT 7.7  ALBUMIN 2.5*   No results for input(s): "LIPASE", "AMYLASE" in the last 168 hours. No results for input(s): "AMMONIA" in the last 168 hours. Coagulation Profile: No results for input(s): "INR", "PROTIME" in the last 168 hours. Cardiac Enzymes: No results for input(s): "CKTOTAL", "CKMB", "CKMBINDEX", "TROPONINI" in the last 168 hours. BNP (last 3 results) No results for input(s): "PROBNP" in the last 8760 hours. HbA1C: No results for input(s): "HGBA1C" in the last 72 hours. CBG: No results for input(s): "GLUCAP" in the last 168 hours. Lipid Profile: No results for input(s): "CHOL", "HDL", "LDLCALC", "TRIG", "CHOLHDL", "LDLDIRECT" in the last 72 hours. Thyroid Function Tests: No results for input(s): "TSH", "T4TOTAL", "FREET4", "T3FREE", "THYROIDAB" in the last 72 hours. Anemia Panel: No results for input(s): "VITAMINB12", "FOLATE", "FERRITIN", "TIBC", "IRON", "RETICCTPCT" in the last 72 hours. Urine analysis:    Component Value Date/Time   COLORURINE YELLOW 08/02/2016 1430   APPEARANCEUR CLEAR 08/02/2016 1430   LABSPEC 1.027 08/02/2016 1430   PHURINE 5.5 08/02/2016 1430   GLUCOSEU NEGATIVE 08/02/2016 1430   HGBUR NEGATIVE 08/02/2016 1430   BILIRUBINUR NEGATIVE 08/02/2016 1430   KETONESUR NEGATIVE 08/02/2016 1430   PROTEINUR NEGATIVE  08/02/2016 1430   UROBILINOGEN 0.2 06/05/2011 1153   NITRITE NEGATIVE 08/02/2016 1430   LEUKOCYTESUR NEGATIVE 08/02/2016 1430    Radiological Exams on Admission: US Venous Img Lower Right (DVT Study)  Result Date: 05/13/2022 CLINICAL DATA:  Right lower extremity swelling. EXAM: Right LOWER EXTREMITY VENOUS DOPPLER ULTRASOUND TECHNIQUE: Gray-scale sonography with graded compression, as well as color Doppler and duplex ultrasound were performed to evaluate the lower extremity deep venous systems from the level of the common femoral vein and including the common femoral, femoral, profunda femoral, popliteal and calf veins including the posterior tibial, peroneal and gastrocnemius veins when visible. The superficial great saphenous vein was also interrogated. Spectral Doppler was utilized to evaluate flow at rest and with distal augmentation maneuvers in the common femoral, femoral and popliteal veins. COMPARISON:  None Available. FINDINGS: Contralateral Common Femoral Vein: Respiratory phasicity is normal and symmetric with the symptomatic side. No evidence of thrombus. Normal compressibility. Common Femoral Vein: No evidence of thrombus. Normal compressibility, respiratory phasicity and response to augmentation. Saphenofemoral Junction: No evidence of thrombus. Normal compressibility and flow on color Doppler imaging. Profunda Femoral Vein: No evidence of thrombus. Normal compressibility and flow on color Doppler imaging. Femoral Vein: No evidence of thrombus. Normal compressibility, respiratory phasicity and response to augmentation. Popliteal Vein: No evidence of thrombus. Normal compressibility, respiratory phasicity and response to augmentation. Calf Veins: Peroneal and posterior tibial veins are not well visualized. Superficial Great Saphenous Vein: No evidence of thrombus. Normal compressibility. Venous Reflux:  None. Other Findings: Probable Baker's cyst seen in right popliteal fossa. Multiple enlarged  right inguinal lymph nodes, the largest measuring 1.9 cm in minor axis. IMPRESSION: No evidence of deep venous thrombosis in visualized veins of right lower extremity, although the calf veins are not well visualized. Significantly enlarged right inguinal adenopathy is noted, with largest lymph node measuring 1.9 cm in minor axis. Tissue sampling is recommended. Electronically Signed   By: Lupita Raider M.D.   On: 05/13/2022 12:10     Assessment/Plan Principal Problem:   Cellulitis    Right lower extremity cellulitis -Failed outpatient treatment -Continue IV antibiotics  Hyponatremia -Hold home HCTZ -Continue normal saline -Monitor repeat labs  Hypokalemia -Replete -Reevaluate in a.m.  ADHD -Continue  Adderall  Hypertension -Hold hydrochlorothiazide with noted hyponatremia -Hold losartan -Continue metoprolol  GERD -PPI  Anxiety/depression -Continue Paxil  Neuropathy -Continue gabapentin  Morbid obesity -BMI 50.21 -Lifestyle changes outpatient  At this point, patient would like to leave AMA as he wants to take care of his animals at home.  Notified ED provider.  High likelihood of readmission.   DVT prophylaxis: Heparin Code Status: Full Family Communication: None at bedside Disposition Plan: Admit for treatment of cellulitis Consults called: None Admission status: Inpatient, telemetry  Severity of Illness: The appropriate patient status for this patient is INPATIENT. Inpatient status is judged to be reasonable and necessary in order to provide the required intensity of service to ensure the patient's safety. The patient's presenting symptoms, physical exam findings, and initial radiographic and laboratory data in the context of their chronic comorbidities is felt to place them at high risk for further clinical deterioration. Furthermore, it is not anticipated that the patient will be medically stable for discharge from the hospital within 2 midnights of admission.    * I certify that at the point of admission it is my clinical judgment that the patient will require inpatient hospital care spanning beyond 2 midnights from the point of admission due to high intensity of service, high risk for further deterioration and high frequency of surveillance required.*   Dalonte Hardage D Alleene Stoy DO [facility]  If 7PM-7AM, please contact night-coverage [email or web address]  05/13/2022, 12:42 PM

## 2022-05-13 NOTE — ED Provider Notes (Addendum)
[facility] EMERGENCY DEPARTMENT Provider Note   CSN: [id number] Arrival date & time: 05/13/22  1011     History  Chief Complaint  Patient presents with   Cellulitis    [name] is a 46 y.o. male.  Patient c/o right lower leg  redness and swelling. Symptoms acute onset in past few weeks. Recently seen in ED for same - was given abx/doxy for cellulitis. Denies fever or chills. Denies hx dvt. No chest pain or sob.   The history is provided by the patient and medical records.       Home Medications Prior to Admission medications   Medication Sig Start Date End Date Taking? Authorizing Provider  acetaminophen (TYLENOL) 500 MG tablet Take 1,000 mg by mouth every 6 (six) hours as needed for moderate pain.    [provider]  amphetamine-dextroamphetamine (ADDERALL) 30 MG tablet Take 1 tablet 2 (two) times daily by mouth. 09/13/17   [provider]  celecoxib (CELEBREX) 200 MG capsule TAKE ONE CAPSULE BY MOUTH TWICE DAILY 05/04/18   Kerrin Champagne, MD  cetirizine (ZYRTEC) 10 MG tablet Take 1 tablet (10 mg total) by mouth daily. 11/23/17   Cathie Hoops, Amy V, PA-C  clonazePAM (KLONOPIN) 0.5 MG tablet Take 1 TABLET BY MOUTH at 8 IN THE MORNING and at 8 pm 09/10/17   [provider]  cyclobenzaprine (FLEXERIL) 10 MG tablet Take 1 tablet (10 mg total) by mouth 3 (three) times daily as needed for muscle spasms. 10/29/17   Synetta Fail, MD  diclofenac sodium (VOLTAREN) 1 % GEL Apply 4 g topically 4 (four) times daily. 09/17/17   Joy, Shawn C, PA-C  doxycycline (VIBRAMYCIN) 100 MG capsule Take 1 capsule (100 mg total) by mouth 2 (two) times daily. 05/10/22   Pollyann Savoy, MD  fluticasone (FLONASE) 50 MCG/ACT nasal spray Place 2 sprays into both nostrils daily. 11/23/17   Cathie Hoops, Amy V, PA-C  furosemide (LASIX) 20 MG tablet Take 1 tablet (20 mg total) by mouth daily. 05/14/19   Laqueta Linden, MD  gabapentin (NEURONTIN) 800 MG tablet Take 1 tablet (800 mg  total) by mouth 2 (two) times daily. 11/24/17   Kerrin Champagne, MD  gabapentin (NEURONTIN) 800 MG tablet  03/15/19   [provider]  gemfibrozil (LOPID) 600 MG tablet Take 600 mg by mouth 2 (two) times daily. 05/16/15   [provider]  hydrochlorothiazide (HYDRODIURIL) 25 MG tablet  03/15/19   [provider]  ibuprofen (ADVIL,MOTRIN) 200 MG tablet Take 600 mg by mouth.    [provider]  losartan (COZAAR) 25 MG tablet Take 1 tablet (25 mg total) by mouth daily. 01/18/20 04/17/20  Laqueta Linden, MD  metoprolol succinate (TOPROL-XL) 25 MG 24 hr tablet Take 1 tablet (25 mg total) by mouth 2 (two) times daily. Take 1 tablet BID for hypertention Patient not taking: Reported on 05/04/2019 09/01/17   Kerrin Champagne, MD  omeprazole (PRILOSEC) 20 MG capsule TAKE ONE CAPSULE BY MOUTH EVERY DAY 05/04/18   Synetta Fail, MD  omeprazole (PRILOSEC) 40 MG capsule take 1 capsule (40 mg) by oral route once daily before a meal for 90 days 03/15/19   [provider]  oxyCODONE-acetaminophen (PERCOCET/ROXICET) 5-325 MG tablet Take 1 tablet by mouth every 6 (six) hours as needed for severe pain. 05/10/22   Pollyann Savoy, MD  PARoxetine (PAXIL) 10 MG tablet Take 10 mg by mouth daily. 03/31/19   [provider]  Allergies    Tramadol and Vicodin [hydrocodone-acetaminophen]    Review of Systems   Review of Systems  Constitutional:  Negative for fever.  HENT:  Negative for sore throat.   Eyes:  Negative for redness.  Respiratory:  Negative for shortness of breath.   Cardiovascular:  Positive for leg swelling. Negative for chest pain.  Gastrointestinal:  Negative for abdominal pain.  Genitourinary:  Negative for flank pain.  Musculoskeletal:  Negative for back pain and neck pain.  Skin:  Negative for rash.  Neurological:  Negative for headaches.  Hematological:  Does not bruise/bleed easily.  Psychiatric/Behavioral:  Negative for confusion.      Physical Exam Updated Vital Signs BP (!) 144/86 (BP Location: Right Arm)   Pulse (!) 102   Temp 97.6 F (36.4 C) (Oral)   Resp 18   Ht 1.803 m (5\' 11" )   Wt (!) 163.3 kg   SpO2 98%   BMI 50.21 kg/m  Physical Exam Vitals and nursing note reviewed.  Constitutional:      Appearance: Normal appearance. He is well-developed.  HENT:     Head: Atraumatic.     Nose: Nose normal.     Mouth/Throat:     Mouth: Mucous membranes are moist.  Eyes:     General: No scleral icterus.    Conjunctiva/sclera: Conjunctivae normal.  Neck:     Trachea: No tracheal deviation.  Cardiovascular:     Rate and Rhythm: Normal rate and regular rhythm.     Pulses: Normal pulses.     Heart sounds: Normal heart sounds. No murmur heard.    No friction rub. No gallop.  Pulmonary:     Effort: Pulmonary effort is normal. No accessory muscle usage or respiratory distress.     Breath sounds: Normal breath sounds.  Abdominal:     General: There is no distension.     Tenderness: There is no abdominal tenderness.  Genitourinary:    Comments: No cva tenderness. Musculoskeletal:        General: No swelling.     Cervical back: Normal range of motion and neck supple. No rigidity.     Comments: Bilateral leg swelling/chronic venous stasis changes, R>>L. Cellulitis right lower leg. No crepitus. Right foot of normal warmth, normal cap refill in toes.   Skin:    General: Skin is warm and dry.     Findings: No rash.  Neurological:     Mental Status: He is alert.     Comments: Alert, speech clear.   Psychiatric:        Mood and Affect: Mood normal.     ED Results / Procedures / Treatments   Labs (all labs ordered are listed, but only abnormal results are displayed) Results for orders placed or performed during the hospital encounter of 05/13/22  CBC  Result Value Ref Range   WBC 16.4 (H) 4.0 - 10.5 K/uL   RBC 4.55 4.22 - 5.81 MIL/uL   Hemoglobin 13.5 13.0 - 17.0 g/dL   HCT 37.1 (L) 06.2 - 69.4 %    MCV 85.5 80.0 - 100.0 fL   MCH 29.7 26.0 - 34.0 pg   MCHC 34.7 30.0 - 36.0 g/dL   RDW 85.4 62.7 - 03.5 %   Platelets 315 150 - 400 K/uL   nRBC 0.0 0.0 - 0.2 %  Comprehensive metabolic panel  Result Value Ref Range   Sodium 132 (L) 135 - 145 mmol/L   Potassium 2.8 (L) 3.5 - 5.1 mmol/L  Chloride 97 (L) 98 - 111 mmol/L   CO2 25 22 - 32 mmol/L   Glucose, Bld 210 (H) 70 - 99 mg/dL   BUN 35 (H) 6 - 20 mg/dL   Creatinine, Ser 4.09 (H) 0.61 - 1.24 mg/dL   Calcium 8.8 (L) 8.9 - 10.3 mg/dL   Total Protein 7.7 6.5 - 8.1 g/dL   Albumin 2.5 (L) 3.5 - 5.0 g/dL   AST 45 (H) 15 - 41 U/L   ALT 30 0 - 44 U/L   Alkaline Phosphatase 140 (H) 38 - 126 U/L   Total Bilirubin 0.5 0.3 - 1.2 mg/dL   GFR, Estimated >81 >19 mL/min   Anion gap 10 5 - 15  Lactic acid, plasma  Result Value Ref Range   Lactic Acid, Venous 2.0 (HH) 0.5 - 1.9 mmol/L   EKG None  Radiology US Venous Img Lower Right (DVT Study)  Result Date: 05/13/2022 CLINICAL DATA:  Right lower extremity swelling. EXAM: Right LOWER EXTREMITY VENOUS DOPPLER ULTRASOUND TECHNIQUE: Gray-scale sonography with graded compression, as well as color Doppler and duplex ultrasound were performed to evaluate the lower extremity deep venous systems from the level of the common femoral vein and including the common femoral, femoral, profunda femoral, popliteal and calf veins including the posterior tibial, peroneal and gastrocnemius veins when visible. The superficial great saphenous vein was also interrogated. Spectral Doppler was utilized to evaluate flow at rest and with distal augmentation maneuvers in the common femoral, femoral and popliteal veins. COMPARISON:  None Available. FINDINGS: Contralateral Common Femoral Vein: Respiratory phasicity is normal and symmetric with the symptomatic side. No evidence of thrombus. Normal compressibility. Common Femoral Vein: No evidence of thrombus. Normal compressibility, respiratory phasicity and response to  augmentation. Saphenofemoral Junction: No evidence of thrombus. Normal compressibility and flow on color Doppler imaging. Profunda Femoral Vein: No evidence of thrombus. Normal compressibility and flow on color Doppler imaging. Femoral Vein: No evidence of thrombus. Normal compressibility, respiratory phasicity and response to augmentation. Popliteal Vein: No evidence of thrombus. Normal compressibility, respiratory phasicity and response to augmentation. Calf Veins: Peroneal and posterior tibial veins are not well visualized. Superficial Great Saphenous Vein: No evidence of thrombus. Normal compressibility. Venous Reflux:  None. Other Findings: Probable Baker's cyst seen in right popliteal fossa. Multiple enlarged right inguinal lymph nodes, the largest measuring 1.9 cm in minor axis. IMPRESSION: No evidence of deep venous thrombosis in visualized veins of right lower extremity, although the calf veins are not well visualized. Significantly enlarged right inguinal adenopathy is noted, with largest lymph node measuring 1.9 cm in minor axis. Tissue sampling is recommended. Electronically Signed   By: Lupita Raider M.D.   On: 05/13/2022 12:10    Procedures Procedures    Medications Ordered in ED Medications  lactated ringers bolus 1,000 mL (has no administration in time range)  vancomycin (VANCOCIN) IVPB 1000 mg/200 mL premix (has no administration in time range)  ceFEPIme (MAXIPIME) 2 g in sodium chloride 0.9 % 100 mL IVPB (has no administration in time range)  potassium chloride SA (KLOR-CON M) CR tablet 40 mEq (has no administration in time range)  potassium chloride 10 mEq in 100 mL IVPB (has no administration in time range)    ED Course/ Medical Decision Making/ A&P                           Medical Decision Making Problems Addressed: AKI (acute kidney injury) (HCC): acute illness or injury  with systemic symptoms that poses a threat to life or bodily functions Cellulitis of right leg: acute  illness or injury with systemic symptoms that poses a threat to life or bodily functions Hypoalbuminemia: chronic illness or injury with exacerbation, progression, or side effects of treatment Hypokalemia: acute illness or injury with systemic symptoms that poses a threat to life or bodily functions  Amount and/or Complexity of Data Reviewed External Data Reviewed: labs and notes. Labs: ordered. Decision-making details documented in ED Course. Radiology: ordered and independent interpretation performed. Decision-making details documented in ED Course. ECG/medicine tests: ordered and independent interpretation performed. Decision-making details documented in ED Course. Discussion of management or test interpretation with external provider(s): Hospitalist md - discussed pt, labs.   Risk Prescription drug management. Decision regarding hospitalization.   Iv ns. Continuous pulse ox and cardiac monitoring. Labs ordered/sent. Imaging ordered.   Reviewed nursing notes and prior charts for additional history. External reports reviewed.   Cardiac monitor: sinus rhythm, rate 98.  Labs reviewed/interpreted by me - wbc elev, mild aki, k very low. Mg added to labs. Continuous pulse ox and monitoring. Ecg. Kcl iv and po.   Vascular u/s reviewed/interpreted by me - no dvt.   Cefepime and vanc iv.   Iv fluids bolus.  Hospitalist consulted for admission - discussed pt, will admit.   CRITICAL CARE  RE: worsening cellulitis despite outpatient abx w AKI r/o bacteremia/sepsis,  severe hypokalemia/iv k replacement.  Performed by: Mirna Mires Total critical care time: 40 minutes Critical care time was exclusive of separately billable procedures and treating other patients. Critical care was necessary to treat or prevent imminent or life-threatening deterioration. Critical care was time spent personally by me on the following activities: development of treatment plan with patient and/or surrogate as  well as nursing, discussions with consultants, evaluation of patient's response to treatment, examination of patient, obtaining history from patient or surrogate, ordering and performing treatments and interventions, ordering and review of laboratory studies, ordering and review of radiographic studies, pulse oximetry and re-evaluation of patient's condition.  Hospitalist had come to admit patient and patient had indicated to them that he was not willing to be admitted to hospital.  I went to reassess patient, and discuss importance of staying for admission (including risk of overwhelming infection/sepsis, loss of leg, and death) - patient had left ED AMA prior to completion of advised treatment and admission without notifying me/staff - pt not in ED, waiting room, or parking lot.   I called patient - discussed above risks, and urged him to reconsider and return for admission - he indicates he will think about returning. Advised he should return ASAP.          Final Clinical Impression(s) / ED Diagnoses Final diagnoses:  Cellulitis of right leg  Hypokalemia  AKI (acute kidney injury) ([place])  Hypoalbuminemia    Rx / DC Orders ED Discharge Orders     None         Lajean Saver, MD 05/13/22 1623

## 2022-05-13 NOTE — Progress Notes (Addendum)
Attempted to get report x2 

## 2022-05-13 NOTE — ED Triage Notes (Signed)
Pt reports on 6/12 he stretched and felt something stinging in r thigh.  On 6/14 started having significant swelling, pain, and redness to r lower leg.  Pt says was seen here on 6/14 and says the EDP performed a bedside ultrasound and is scheduled for for an ultrasound in radiology on 6/22.  Reports has been taking doxycycline and percocet.  Pt says he came back today for a recheck.  Reports swelling has decreased some since 6/14.

## 2022-05-13 NOTE — ED Notes (Signed)
EDP aware and seeing pt in vertical area1

## 2022-05-13 NOTE — ED Notes (Signed)
Dr Sherryll Burger advised pt refused to be admitted. Talked with pt. Pt was advised by myself and Dr Sherryll Burger of risks of him not staying. Pt Stated he is not staying

## 2022-05-14 ENCOUNTER — Other Ambulatory Visit: Payer: Self-pay

## 2022-05-14 ENCOUNTER — Encounter (HOSPITAL_COMMUNITY): Payer: Self-pay

## 2022-05-14 ENCOUNTER — Inpatient Hospital Stay (HOSPITAL_COMMUNITY)
Admission: EM | Admit: 2022-05-14 | Discharge: 2022-05-17 | DRG: 603 | Disposition: A | Discharge status: Home Health | Payer: Medicare Other | Attending: Internal Medicine | Admitting: Internal Medicine

## 2022-05-14 DIAGNOSIS — K219 Gastro-esophageal reflux disease without esophagitis: Secondary | ICD-10-CM | POA: Diagnosis present

## 2022-05-14 DIAGNOSIS — Z6841 Body Mass Index (BMI) 40.0 and over, adult: Secondary | ICD-10-CM

## 2022-05-14 DIAGNOSIS — E46 Unspecified protein-calorie malnutrition: Secondary | ICD-10-CM | POA: Diagnosis present

## 2022-05-14 DIAGNOSIS — F17210 Nicotine dependence, cigarettes, uncomplicated: Secondary | ICD-10-CM | POA: Diagnosis present

## 2022-05-14 DIAGNOSIS — N179 Acute kidney failure, unspecified: Secondary | ICD-10-CM | POA: Diagnosis present

## 2022-05-14 DIAGNOSIS — G629 Polyneuropathy, unspecified: Secondary | ICD-10-CM | POA: Diagnosis present

## 2022-05-14 DIAGNOSIS — Z8249 Family history of ischemic heart disease and other diseases of the circulatory system: Secondary | ICD-10-CM

## 2022-05-14 DIAGNOSIS — L03115 Cellulitis of right lower limb: Secondary | ICD-10-CM | POA: Diagnosis not present

## 2022-05-14 DIAGNOSIS — E441 Mild protein-calorie malnutrition: Secondary | ICD-10-CM | POA: Diagnosis present

## 2022-05-14 DIAGNOSIS — E871 Hypo-osmolality and hyponatremia: Secondary | ICD-10-CM | POA: Diagnosis present

## 2022-05-14 DIAGNOSIS — E8809 Other disorders of plasma-protein metabolism, not elsewhere classified: Secondary | ICD-10-CM | POA: Diagnosis present

## 2022-05-14 DIAGNOSIS — Z833 Family history of diabetes mellitus: Secondary | ICD-10-CM

## 2022-05-14 DIAGNOSIS — Z823 Family history of stroke: Secondary | ICD-10-CM

## 2022-05-14 DIAGNOSIS — I10 Essential (primary) hypertension: Secondary | ICD-10-CM | POA: Diagnosis present

## 2022-05-14 DIAGNOSIS — E876 Hypokalemia: Secondary | ICD-10-CM | POA: Diagnosis present

## 2022-05-14 NOTE — ED Triage Notes (Signed)
Pt presents with significant cellulitis to the RLE with serous drainage. Pt was here yesterday and was supposed to be admitted for IV abx, but eloped.

## 2022-05-15 ENCOUNTER — Emergency Department (HOSPITAL_COMMUNITY): Payer: Medicare Other

## 2022-05-15 DIAGNOSIS — Z823 Family history of stroke: Secondary | ICD-10-CM | POA: Diagnosis not present

## 2022-05-15 DIAGNOSIS — L03115 Cellulitis of right lower limb: Secondary | ICD-10-CM | POA: Diagnosis present

## 2022-05-15 DIAGNOSIS — E46 Unspecified protein-calorie malnutrition: Secondary | ICD-10-CM

## 2022-05-15 DIAGNOSIS — Z8249 Family history of ischemic heart disease and other diseases of the circulatory system: Secondary | ICD-10-CM | POA: Diagnosis not present

## 2022-05-15 DIAGNOSIS — E876 Hypokalemia: Secondary | ICD-10-CM | POA: Diagnosis present

## 2022-05-15 DIAGNOSIS — Z833 Family history of diabetes mellitus: Secondary | ICD-10-CM | POA: Diagnosis not present

## 2022-05-15 DIAGNOSIS — I10 Essential (primary) hypertension: Secondary | ICD-10-CM

## 2022-05-15 DIAGNOSIS — E871 Hypo-osmolality and hyponatremia: Secondary | ICD-10-CM

## 2022-05-15 DIAGNOSIS — F17210 Nicotine dependence, cigarettes, uncomplicated: Secondary | ICD-10-CM | POA: Diagnosis present

## 2022-05-15 DIAGNOSIS — G629 Polyneuropathy, unspecified: Secondary | ICD-10-CM | POA: Diagnosis present

## 2022-05-15 DIAGNOSIS — E441 Mild protein-calorie malnutrition: Secondary | ICD-10-CM | POA: Diagnosis present

## 2022-05-15 DIAGNOSIS — D72829 Elevated white blood cell count, unspecified: Secondary | ICD-10-CM

## 2022-05-15 DIAGNOSIS — D75839 Thrombocytosis, unspecified: Secondary | ICD-10-CM

## 2022-05-15 DIAGNOSIS — E8809 Other disorders of plasma-protein metabolism, not elsewhere classified: Secondary | ICD-10-CM

## 2022-05-15 DIAGNOSIS — N179 Acute kidney failure, unspecified: Secondary | ICD-10-CM | POA: Diagnosis present

## 2022-05-15 DIAGNOSIS — K219 Gastro-esophageal reflux disease without esophagitis: Secondary | ICD-10-CM

## 2022-05-15 DIAGNOSIS — Z6841 Body Mass Index (BMI) 40.0 and over, adult: Secondary | ICD-10-CM | POA: Diagnosis not present

## 2022-05-15 LAB — CBC WITH DIFFERENTIAL/PLATELET
Abs Immature Granulocytes: 0.89 10*3/uL — ABNORMAL HIGH (ref 0.00–0.07)
Basophils Absolute: 0.1 10*3/uL (ref 0.0–0.1)
Basophils Relative: 1 %
Eosinophils Absolute: 0.1 10*3/uL (ref 0.0–0.5)
Eosinophils Relative: 1 %
HCT: 37.9 % — ABNORMAL LOW (ref 39.0–52.0)
Hemoglobin: 13 g/dL (ref 13.0–17.0)
Immature Granulocytes: 5 %
Lymphocytes Relative: 14 %
Lymphs Abs: 2.6 10*3/uL (ref 0.7–4.0)
MCH: 29.4 pg (ref 26.0–34.0)
MCHC: 34.3 g/dL (ref 30.0–36.0)
MCV: 85.7 fL (ref 80.0–100.0)
Monocytes Absolute: 1.8 10*3/uL — ABNORMAL HIGH (ref 0.1–1.0)
Monocytes Relative: 10 %
Neutro Abs: 12.7 10*3/uL — ABNORMAL HIGH (ref 1.7–7.7)
Neutrophils Relative %: 69 %
Platelets: 401 10*3/uL — ABNORMAL HIGH (ref 150–400)
RBC: 4.42 MIL/uL (ref 4.22–5.81)
RDW: 13.7 % (ref 11.5–15.5)
WBC: 18.3 10*3/uL — ABNORMAL HIGH (ref 4.0–10.5)
nRBC: 0 % (ref 0.0–0.2)

## 2022-05-15 LAB — HIV ANTIBODY (ROUTINE TESTING W REFLEX): HIV Screen 4th Generation wRfx: NONREACTIVE

## 2022-05-15 LAB — PHOSPHORUS: Phosphorus: 2.5 mg/dL (ref 2.5–4.6)

## 2022-05-15 LAB — COMPREHENSIVE METABOLIC PANEL
ALT: 34 U/L (ref 0–44)
AST: 34 U/L (ref 15–41)
Albumin: 2.3 g/dL — ABNORMAL LOW (ref 3.5–5.0)
Alkaline Phosphatase: 132 U/L — ABNORMAL HIGH (ref 38–126)
Anion gap: 7 (ref 5–15)
BUN: 21 mg/dL — ABNORMAL HIGH (ref 6–20)
CO2: 29 mmol/L (ref 22–32)
Calcium: 8.2 mg/dL — ABNORMAL LOW (ref 8.9–10.3)
Chloride: 97 mmol/L — ABNORMAL LOW (ref 98–111)
Creatinine, Ser: 0.96 mg/dL (ref 0.61–1.24)
GFR, Estimated: >60 mL/min (ref >60)
Glucose, Bld: 119 mg/dL — ABNORMAL HIGH (ref 70–99)
Potassium: 2.9 mmol/L — ABNORMAL LOW (ref 3.5–5.1)
Sodium: 133 mmol/L — ABNORMAL LOW (ref 135–145)
Total Bilirubin: 0.7 mg/dL (ref 0.3–1.2)
Total Protein: 7.9 g/dL (ref 6.5–8.1)

## 2022-05-15 LAB — LACTIC ACID, PLASMA: Lactic Acid, Venous: 1.3 mmol/L (ref 0.5–1.9)

## 2022-05-15 LAB — MAGNESIUM: Magnesium: 1.8 mg/dL (ref 1.7–2.4)

## 2022-05-15 MED ORDER — PIPERACILLIN-TAZOBACTAM 3.375 G IVPB
3.3750 g | Freq: Once | INTRAVENOUS | Status: AC
Start: 2022-05-15 — End: 2022-05-15
  Administered 2022-05-15: 3.375 g via INTRAVENOUS
  Filled 2022-05-15: qty 50

## 2022-05-15 MED ORDER — PANTOPRAZOLE SODIUM 40 MG PO TBEC
40.0000 mg | DELAYED_RELEASE_TABLET | Freq: Every day | ORAL | Status: DC
  Administered 2022-05-15 – 2022-05-17 (×3): 40 mg via ORAL
  Filled 2022-05-15 (×3): qty 1

## 2022-05-15 MED ORDER — HYDRALAZINE HCL 20 MG/ML IJ SOLN: 10.0000 mg | Freq: Four times a day (QID) | INTRAMUSCULAR | Status: DC | PRN

## 2022-05-15 MED ORDER — LACTATED RINGERS IV BOLUS
1000.0000 mL | Freq: Once | INTRAVENOUS | Status: AC
  Administered 2022-05-15: 1000 mL via INTRAVENOUS

## 2022-05-15 MED ORDER — HYDROMORPHONE HCL 1 MG/ML IJ SOLN
1.0000 mg | Freq: Once | INTRAMUSCULAR | Status: AC
  Administered 2022-05-15: 1 mg via INTRAVENOUS
  Filled 2022-05-15: qty 1

## 2022-05-15 MED ORDER — SODIUM CHLORIDE 0.9 % IV SOLN
2.0000 g | Freq: Three times a day (TID) | INTRAVENOUS | Status: DC
  Administered 2022-05-15 – 2022-05-17 (×5): 2 g via INTRAVENOUS
  Filled 2022-05-15 (×5): qty 12.5

## 2022-05-15 MED ORDER — VANCOMYCIN HCL 2000 MG/400ML IV SOLN
2000.0000 mg | Freq: Once | INTRAVENOUS | Status: AC
  Administered 2022-05-15: 2000 mg via INTRAVENOUS
  Filled 2022-05-15: qty 400

## 2022-05-15 MED ORDER — ONDANSETRON HCL 4 MG/2ML IJ SOLN
4.0000 mg | Freq: Once | INTRAMUSCULAR | Status: AC
  Administered 2022-05-15: 4 mg via INTRAVENOUS
  Filled 2022-05-15: qty 2

## 2022-05-15 MED ORDER — LOSARTAN POTASSIUM 50 MG PO TABS
25.0000 mg | ORAL_TABLET | Freq: Every day | ORAL | Status: DC
  Administered 2022-05-15 – 2022-05-17 (×3): 25 mg via ORAL
  Filled 2022-05-15 (×3): qty 1

## 2022-05-15 MED ORDER — FENTANYL CITRATE PF 50 MCG/ML IJ SOSY
50.0000 ug | PREFILLED_SYRINGE | Freq: Once | INTRAMUSCULAR | Status: AC
  Administered 2022-05-15: 50 ug via INTRAVENOUS
  Filled 2022-05-15: qty 1

## 2022-05-15 MED ORDER — IOHEXOL 300 MG/ML  SOLN
100.0000 mL | Freq: Once | INTRAMUSCULAR | Status: AC | PRN
Start: 2022-05-15 — End: 2022-05-15
  Administered 2022-05-15: 100 mL via INTRAVENOUS

## 2022-05-15 MED ORDER — ENOXAPARIN SODIUM 80 MG/0.8ML IJ SOSY
80.0000 mg | PREFILLED_SYRINGE | INTRAMUSCULAR | Status: DC
  Administered 2022-05-15 – 2022-05-17 (×3): 80 mg via SUBCUTANEOUS
  Filled 2022-05-15 (×3): qty 0.8

## 2022-05-15 MED ORDER — PANTOPRAZOLE SODIUM 20 MG PO TBEC
20.0000 mg | DELAYED_RELEASE_TABLET | Freq: Every day | ORAL | Status: DC
  Filled 2022-05-15 (×3): qty 1

## 2022-05-15 MED ORDER — ENSURE ENLIVE PO LIQD
237.0000 mL | Freq: Two times a day (BID) | ORAL | Status: DC
  Administered 2022-05-15 – 2022-05-17 (×5): 237 mL via ORAL

## 2022-05-15 MED ORDER — POTASSIUM CHLORIDE CRYS ER 20 MEQ PO TBCR
40.0000 meq | EXTENDED_RELEASE_TABLET | Freq: Once | ORAL | Status: AC
  Administered 2022-05-15: 40 meq via ORAL
  Filled 2022-05-15: qty 2

## 2022-05-15 MED ORDER — ONDANSETRON HCL 4 MG/2ML IJ SOLN: 4.0000 mg | Freq: Four times a day (QID) | INTRAMUSCULAR | Status: DC | PRN

## 2022-05-15 MED ORDER — OXYCODONE-ACETAMINOPHEN 5-325 MG PO TABS
1.0000 | ORAL_TABLET | Freq: Four times a day (QID) | ORAL | Status: DC | PRN
  Administered 2022-05-15 – 2022-05-17 (×8): 1 via ORAL
  Filled 2022-05-15 (×8): qty 1

## 2022-05-15 MED ORDER — VANCOMYCIN HCL 1500 MG/300ML IV SOLN
1500.0000 mg | Freq: Two times a day (BID) | INTRAVENOUS | Status: DC
  Administered 2022-05-15 – 2022-05-17 (×5): 1500 mg via INTRAVENOUS
  Filled 2022-05-15 (×5): qty 300

## 2022-05-15 NOTE — Hospital Course (Addendum)
Mr. Fluegge was admitted to the hospital with the working diagnosis of cellulitis/ wound infection   46 yo male with the past medical history of hypertension, GERD, ADHD, depression and obesity who presented with right leg pain. Positive erythema and edema on his right leg. On 06/16 he left the ED AMA, but because worsening symptoms he returned. On his initial physical examination his blood pressure was 150/96, HR 105, RR 17 and 02 saturation 100%, lungs were clear to auscultation, heart with S1 and S2 present and rhythmic, abdomen soft and right lower extremity edema with erythema and ruptured blisters.   Na 132, K 2,8 Cl 97 bicarbonate 25 glucose 210 bun 35 cr 1,36  Lactic acid 2,0  Wbc 16,4 hgb 13,5 plt 315   Patient was placed on broad spectrum antibiotic therapy with improvement in wound infection.  He has been afebrile with improvement in leg pain. No nausea or vomiting, no diarrhea.   Plan to discharge home tomorrow if wound continue to improve.

## 2022-05-15 NOTE — Assessment & Plan Note (Addendum)
Hyponatremia, hypokalemia   Renal function today with serum cr at 0,69, K is 4,1 and serum bicarbonate at 28. Na 134.   Plan to continue close follow up on electrolytes. Vancomycin dosing per pharmacy protocol.

## 2022-05-15 NOTE — H&P (Signed)
History and Physical    Patient: [name] DOB: 22-Nov-1976 DOA: 05/14/2022 DOS: the patient was seen and examined on 05/15/2022 PCP: Pcp, No  Patient coming from: Home  Chief Complaint:  Chief Complaint  Patient presents with   Wound Infection   HPI: [name] is a 46 y.o. male with medical history significant of hypertension, GERD, ADHD, anxiety/depression, neuropathy, morbid obesity who presents to the emergency department due to right leg wound infection.  Patient complained of onset of symptoms on 6/14 with right leg pain, redness and swelling, he states that stretched his leg a few days prior to stated date (6/14) and felt a pop.  He presented to the ED on 6/16, he was offered an admission at that time, but refused and was discharged home with doxycycline and for him to have an outpatient ultrasound of right lower extremity.  He presents to the ED this morning stating that the wound was unable to heal at home and that he wanted to be admitted and treated in the hospital.  He has been taking doxycycline without any improvement, patient denies fever, chills, headache, chest pain, shortness of breath  ED Course:  In the emergency department, he was tachycardic, BP was 150/96, other vital signs were within normal range.  Work-up in the ED showed leukocytosis of thrombocytosis, BMP showed sodium 133,, potassium 2.9, chloride 97, bicarb 29, blood glucose 119, BUN 21, creatinine 0.96, albumin 2.3, ALP 132, lactic acid 1.3, magnesium 1.9.  Blood culture pending CT of lower right extremity with contrast showed Extensive subcutaneous fat stranding and edema in the right lower extremity compatible with cellulitis. No evidence of abscess or osteomyelitis. He was started on vancomycin and Zosyn, potassium was replenished, Zofran was given, Dilaudid and fentanyl were given due to pain and IV hydration was provided.  Hospitalist was asked to admit patient for further evaluation  and management.  Review of Systems: Review of systems as noted in the HPI. All other systems reviewed and are negative.   Past Medical History:  Diagnosis Date   ADHD (attention deficit hyperactivity disorder)    Anxiety    Arthritis    knee and back   Chronic back pain    Depression    GERD (gastroesophageal reflux disease)    Hypertension    Neuropathy    Obesity    PONV (postoperative nausea and vomiting)    Past Surgical History:  Procedure Laterality Date   BACK SURGERY     CARPAL TUNNEL RELEASE Left    HAND SURGERY     KNEE SURGERY Right    TONSILLECTOMY      Social History:  reports that he has been smoking cigarettes. He has a 9.00 pack-year smoking history. He has never used smokeless tobacco. He reports that he does not drink alcohol and does not use drugs.   Allergies  Allergen Reactions   Tramadol Nausea And Vomiting   Vicodin [Hydrocodone-Acetaminophen] Nausea And Vomiting    Family History  Problem Relation Age of Onset   Heart failure Mother    Heart disease Mother    Diabetes Father    Stroke Brother      Prior to Admission medications   Medication Sig Start Date End Date Taking? Authorizing Provider  acetaminophen (TYLENOL) 500 MG tablet Take 1,000 mg by mouth every 6 (six) hours as needed for moderate pain.    [provider]  doxycycline (VIBRAMYCIN) 100 MG capsule Take 1 capsule (100 mg total) by  mouth 2 (two) times daily. 05/10/22   Truddie Hidden, MD  gabapentin (NEURONTIN) 800 MG tablet Take 1 tablet (800 mg total) by mouth 2 (two) times daily. Patient taking differently: Take 300 mg by mouth 3 (three) times daily. 11/24/17   Jessy Oto, MD  LINZESS 145 MCG CAPS capsule Take 1 capsule by mouth daily. 04/23/22   [provider]  naproxen (NAPROSYN) 500 MG tablet Take 1-2 tablets by mouth every 12 (twelve) hours as needed for pain. 04/05/22   [provider]  oxyCODONE-acetaminophen (PERCOCET/ROXICET) 5-325 MG  tablet Take 1 tablet by mouth every 6 (six) hours as needed for severe pain. 05/10/22   Truddie Hidden, MD  pantoprazole (PROTONIX) 20 MG tablet Take 20 mg by mouth daily. 04/29/22   [provider]  promethazine (PHENERGAN) 25 MG tablet Take 25 mg by mouth every 8 (eight) hours as needed for nausea/vomiting. 01/25/22   [provider]    Physical Exam: BP (!) 150/96   Pulse (!) 105   Temp 98.9 F (37.2 C) (Oral)   Resp 17   Ht 5\' 11"  (1.803 m)   Wt (!) 163.3 kg   SpO2 100%   BMI 50.21 kg/m   General: 46 y.o. year-old obese male well developed well nourished in no acute distress.  Alert and oriented x3. HEENT: NCAT, EOMI Neck: Supple, trachea medial Cardiovascular: Regular rate and rhythm with no rubs or gallops.  No thyromegaly or JVD noted.  No lower extremity edema. 2/4 pulses in all 4 extremities. Respiratory: Clear to auscultation with no wheezes or rales. Good inspiratory effort. Abdomen: Soft, nontender nondistended with normal bowel sounds x4 quadrants. Muskuloskeletal: No cyanosis, clubbing or edema noted bilaterally Neuro: CN II-XII intact, strength 5/5 x 4, sensation, reflexes intact Skin: Erythema and swelling of right lower extremity with skin blisters no ulcerative lesions noted or rashes Psychiatry: Judgement and insight appear normal. Mood is appropriate for condition and setting            Labs on Admission:  Basic Metabolic Panel: Recent Labs  Lab 05/10/22 0055 05/13/22 1112 05/13/22 1113 05/15/22 0006  NA 133* 132*  --  133*  K 4.0 2.8*  --  2.9*  CL 96* 97*  --  97*  CO2 28 25  --  29  GLUCOSE 138* 210*  --  119*  BUN 34* 35*  --  21*  CREATININE 1.25* 1.36*  --  0.96  CALCIUM 8.7* 8.8*  --  8.2*  MG  --   --  1.9  --    Liver Function Tests: Recent Labs  Lab 05/13/22 1112 05/15/22 0006  AST 45* 34  ALT 30 34  ALKPHOS 140* 132*  BILITOT 0.5 0.7  PROT 7.7 7.9  ALBUMIN 2.5* 2.3*   No results for input(s): "LIPASE",  "AMYLASE" in the last 168 hours. No results for input(s): "AMMONIA" in the last 168 hours. CBC: Recent Labs  Lab 05/10/22 0055 05/13/22 1112 05/15/22 0006  WBC 14.1* 16.4* 18.3*  NEUTROABS 12.2*  --  12.7*  HGB 12.1* 13.5 13.0  HCT 35.4* 38.9* 37.9*  MCV 87.6 85.5 85.7  PLT 156 315 401*   Cardiac Enzymes: No results for input(s): "CKTOTAL", "CKMB", "CKMBINDEX", "TROPONINI" in the last 168 hours.  BNP (last 3 results) No results for input(s): "BNP" in the last 8760 hours.  ProBNP (last 3 results) No results for input(s): "PROBNP" in the last 8760 hours.  CBG: No results for input(s): "GLUCAP"  in the last 168 hours.  Radiological Exams on Admission: CT EXTREMITY LOWER RIGHT W CONTRAST  Result Date: 05/15/2022 CLINICAL DATA:  Right lower extremity cellulitis with drainage. EXAM: CT OF THE LOWER RIGHT EXTREMITY WITH CONTRAST TECHNIQUE: Multidetector CT imaging of the lower right extremity was performed according to the standard protocol following intravenous contrast administration. RADIATION DOSE REDUCTION: This exam was performed according to the departmental dose-optimization program which includes automated exposure control, adjustment of the mA and/or kV according to patient size and/or use of iterative reconstruction technique. CONTRAST:  OMNIPAQUE IOHEXOL 300 MG/ML  SOLN COMPARISON:  05/13/2022. FINDINGS: Bones/Joint/Cartilage No acute fracture or dislocation. Degenerative changes are noted at the ankle, foot, and knee. A trace joint effusion is noted at the knee. A fluid collection is noted in the popliteal fossa containing a few bony densities suggesting Baker's cyst with likely loose bodies. Ligaments Suboptimally assessed by CT. Muscles and Tendons No significant intramuscular edema. Soft tissues Diffuse subcutaneous fat stranding and edema is noted in the right lower extremity and over the dorsum of the foot. No abscess or abnormal enhancement is identified. IMPRESSION: 1.  Extensive subcutaneous fat stranding and edema in the right lower extremity compatible with cellulitis. No evidence of abscess or osteomyelitis. 2. Degenerative changes at the knee with trace joint effusion. 3. Baker's cyst in the popliteal fossa containing loose bodies. Electronically Signed   By: Thornell Sartorius M.D.   On: 05/15/2022 03:52   US Venous Img Lower Right (DVT Study)  Result Date: 05/13/2022 CLINICAL DATA:  Right lower extremity swelling. EXAM: Right LOWER EXTREMITY VENOUS DOPPLER ULTRASOUND TECHNIQUE: Gray-scale sonography with graded compression, as well as color Doppler and duplex ultrasound were performed to evaluate the lower extremity deep venous systems from the level of the common femoral vein and including the common femoral, femoral, profunda femoral, popliteal and calf veins including the posterior tibial, peroneal and gastrocnemius veins when visible. The superficial great saphenous vein was also interrogated. Spectral Doppler was utilized to evaluate flow at rest and with distal augmentation maneuvers in the common femoral, femoral and popliteal veins. COMPARISON:  None Available. FINDINGS: Contralateral Common Femoral Vein: Respiratory phasicity is normal and symmetric with the symptomatic side. No evidence of thrombus. Normal compressibility. Common Femoral Vein: No evidence of thrombus. Normal compressibility, respiratory phasicity and response to augmentation. Saphenofemoral Junction: No evidence of thrombus. Normal compressibility and flow on color Doppler imaging. Profunda Femoral Vein: No evidence of thrombus. Normal compressibility and flow on color Doppler imaging. Femoral Vein: No evidence of thrombus. Normal compressibility, respiratory phasicity and response to augmentation. Popliteal Vein: No evidence of thrombus. Normal compressibility, respiratory phasicity and response to augmentation. Calf Veins: Peroneal and posterior tibial veins are not well visualized. Superficial  Great Saphenous Vein: No evidence of thrombus. Normal compressibility. Venous Reflux:  None. Other Findings: Probable Baker's cyst seen in right popliteal fossa. Multiple enlarged right inguinal lymph nodes, the largest measuring 1.9 cm in minor axis. IMPRESSION: No evidence of deep venous thrombosis in visualized veins of right lower extremity, although the calf veins are not well visualized. Significantly enlarged right inguinal adenopathy is noted, with largest lymph node measuring 1.9 cm in minor axis. Tissue sampling is recommended. Electronically Signed   By: Lupita Raider M.D.   On: 05/13/2022 12:10    EKG: I independently viewed the EKG done and my findings are as followed: EKG was not done in the ED  Assessment/Plan Present on Admission:  Cellulitis of right lower extremity  Essential hypertension, benign  GERD (gastroesophageal reflux disease)  Principal Problem:   Cellulitis of right lower extremity Active Problems:   Essential hypertension, benign   GERD (gastroesophageal reflux disease)   Leukocytosis   Thrombocytosis   Hyponatremia   Hypokalemia   Hypoalbuminemia due to protein-calorie malnutrition (HCC)  Cellulitis of right lower extremity with wounds status post outpatient treatment failure Ultrasound of right lower extremity done on 6/19 showed no evidence of DVT He was started on IV Vanco and Zosyn; continue IV vancomycin Continue Tylenol as needed for mild pain Continue Percocet as needed for moderate/severe pain Continue wound care  Leukocytosis possibly secondary to above WBC 18.3, continue treatment as described above  Thrombocytosis possibly reactive process Platelets 401, continue to monitor platelet levels  Hyponatremia Na 133, continue IV hydration  Hypokalemia K+ is 2.9 K+ will be replenished Please monitor for AM K+ for further replenishmemnt  Hypoalbuminemia possibly secondary to moderate protein calorie malnutrition Albumin 2.3, protein  supplement to be provided  Essential hypertension No antihypertensive medication noted in med rec, we shall await updated med rec Continue IV hydralazine 10 mg every 6 hours as needed for SBP > 170  GERD Continue Protonix  DVT prophylaxis: Lovenox  Code Status: Full code  Consults: None  Family Communication: None at bedside  Severity of Illness: The appropriate patient status for this patient is INPATIENT. Inpatient status is judged to be reasonable and necessary in order to provide the required intensity of service to ensure the patient's safety. The patient's presenting symptoms, physical exam findings, and initial radiographic and laboratory data in the context of their chronic comorbidities is felt to place them at high risk for further clinical deterioration. Furthermore, it is not anticipated that the patient will be medically stable for discharge from the hospital within 2 midnights of admission.   * I certify that at the point of admission it is my clinical judgment that the patient will require inpatient hospital care spanning beyond 2 midnights from the point of admission due to high intensity of service, high risk for further deterioration and high frequency of surveillance required.*  Author: Frankey Shown, DO 05/15/2022 5:08 AM  For on call review [email or web address].

## 2022-05-15 NOTE — Assessment & Plan Note (Addendum)
Clinically wound is improving. Local pain has improved. WBC is down to 11,3.  Plan to transition to oral antibiotic therapy with Bactrim Continue local wound care as outpatient. Pain control with ibuprofen and acetaminophen.

## 2022-05-15 NOTE — TOC Progression Note (Signed)
  Transition of Care [facility]) Screening Note   Patient Details  Name: TRELLIS VANOVERBEKE Date of Birth: 07/01/1976   Transition of Care [facility]) CM/SW Contact:    Elliot Gault, LCSW Phone Number: 05/15/2022, 9:04 AM    Transition of Care Department [facility]) has reviewed patient and no TOC needs have been identified at this time. We will continue to monitor patient advancement through interdisciplinary progression rounds. If new patient transition needs arise, please place a TOC consult.

## 2022-05-15 NOTE — Consult Note (Addendum)
WOC Nurse Consult Note: Reason for Consult: Consult requested for right leg.  Performed remotely after review of progress notes and photos in the EMR.  Right leg with generalized edema and erythremia upon admission; this has now evolved into full thickness tissue loss to right anterior and posterior RLE.  Wound beds are red and moist with mod amt yellow drainage, loose skin beginning to lift and peel. Dressing procedure/placement/frequency: Topical treatment orders provided for bedside nurses to perform as follows to promote drying and healing, absorb drainage, and provide light compression.  Please re-consult if further assistance is needed.  Thank-you,  Cammie Mcgee MSN, RN, CWOCN, [place], CNS [phone]

## 2022-05-15 NOTE — Assessment & Plan Note (Signed)
Continue with nutritional supplements.  

## 2022-05-15 NOTE — Progress Notes (Addendum)
  Progress Note   Patient: [name] QIH:[id number] DOB: 1976/11/05 DOA: 05/14/2022     0 DOS: the patient was seen and examined on 05/15/2022   Brief hospital course: Mr. Badley was admitted to the hospital with the working diagnosis of cellulitis/ wound infection   46 yo male with the past medical history of hypertension, GERD, ADHD, depression and obesity who presented with right leg pain. Positive erythema and edema on his right leg. On 06/16 he left the ED AMA, but because worsening symptoms he returned. On his initial physical examination his blood pressure was 150/96, HR 105, RR 17 and 02 saturation 100%, lungs were clear to auscultation, heart with S1 and S2 present and rhythmic, abdomen soft and right lower extremity edema with erythema and ruptured blisters.   Na 132, K 2,8 Cl 97 bicarbonate 25 glucose 210 bun 35 cr 1,36  Lactic acid 2,0  Wbc 16,4 hgb 13,5 plt 315   Assessment and Plan: * Cellulitis of right lower extremity Wbc continue to be elevated at 18,3 Patient has been afebrile Wound with dressing in place.   Plan to continue IV antibiotic therapy with Vancomycin, will add cefepime  Continue close follow up on response Follow up on cultures.   Essential hypertension, benign Continue blood pressure monitoring  Add losartan for uncontrolled HTN  GERD (gastroesophageal reflux disease) Continue with ppi  Hypoalbuminemia due to protein-calorie malnutrition (HCC) Continue with nutritional supplements.   AKI (acute kidney injury) (HCC) Hyponatremia, hypokalemia   Renal function with serum cr at 0,96 with K at 2,9 and Na at 133.  Plan to continue K correction with Kcl 80 meq today and follow up renal function in am. Hold on IV fluids for now.   Class 3 obesity (HCC) Calculated BMI is 48,2         Subjective: Patient with continue pain right lower extremity but improved in intensity   Physical Exam: Vitals:   05/15/22 0127 05/15/22 0511 05/15/22 0618  05/15/22 1044  BP: (!) 150/96 (!) 161/126 (!) 146/55 (!) 151/77  Pulse: (!) 105 99 98 82  Resp: 17 18  17   Temp:  98.6 F (37 C)  98.2 F (36.8 C)  TempSrc:    Oral  SpO2: 100% 100% 100% 99%  Weight:  (!) 156.9 kg    Height:       Neurology awake and alert ENT With no pallor Cardiovascular with S1 and S2 present Respiratory with no wheezing Abdomen not distended No lower extremity edema Right lower extremity with dressing in place  Data Reviewed:    Family Communication: no family at the bedside   Disposition: Status is: Inpatient Remains inpatient appropriate because: wound infection   Planned Discharge Destination: Home  Author: , MD 05/15/2022 5:03 PM  For on call review [email or web address].

## 2022-05-15 NOTE — Assessment & Plan Note (Deleted)
Patient has been placed on losartan for blood pressure control. Plan to follow up as outpatient.

## 2022-05-15 NOTE — ED Notes (Signed)
Patient transported to CT 

## 2022-05-15 NOTE — Progress Notes (Signed)
Patient slept most of the day.  He was offered pain medication several times but only accepted after dressing was applied to rt lower leg. He has been oriented x4

## 2022-05-15 NOTE — Progress Notes (Signed)
Pharmacy Antibiotic Note  [name] is a 46 y.o. male admitted on 05/14/2022 with cellulitis.  Pharmacy has been consulted for Vancomycin dosing. WBC is increasing. Left AMA yesterday before receiving any anti-biotics. Renal function ok.   Plan: Vancomycin 2000 mg IV x 1, then 1500 mg IV q12h >>>Estimated AUC: 407 Trend WBC, temp, renal function  F/U infectious work-up Drug levels as indicated   Height: 5\' 11"  (180.3 cm) Weight: (!) 163.3 kg (360 lb) IBW/kg (Calculated) : 75.3  Temp (24hrs), Avg:98.9 F (37.2 C), Min:98.9 F (37.2 C), Max:98.9 F (37.2 C)  Recent Labs  Lab 05/10/22 0055 05/13/22 1112 05/13/22 1114 05/15/22 0006  WBC 14.1* 16.4*  --  18.3*  CREATININE 1.25* 1.36*  --  0.96  LATICACIDVEN  --   --  2.0* 1.3    Estimated Creatinine Clearance: 151.9 mL/min (by C-G formula based on SCr of 0.96 mg/dL).    Allergies  Allergen Reactions   Tramadol Nausea And Vomiting   Vicodin [Hydrocodone-Acetaminophen] Nausea And Vomiting    05/17/22, PharmD, BCPS Clinical Pharmacist Phone: [phone]

## 2022-05-15 NOTE — Assessment & Plan Note (Addendum)
Patient was placed on pantoprazole during his hospitalization.

## 2022-05-15 NOTE — Progress Notes (Signed)
Pharmacy Antibiotic Note  [name] is a 46 y.o. male admitted on 05/14/2022 with cellulitis and / wound infection .  Pharmacy has been consulted for cefepime dosing.  Plan: Cefepime 2 grams iv q8h Monitor micro's, de-escalate as able   Height: 5\' 11"  (180.3 cm) Weight: (!) 156.9 kg (345 lb 14.4 oz) IBW/kg (Calculated) : 75.3  Temp (24hrs), Avg:98.6 F (37 C), Min:98.2 F (36.8 C), Max:98.9 F (37.2 C)  Recent Labs  Lab 05/10/22 0055 05/13/22 1112 05/13/22 1114 05/15/22 0006  WBC 14.1* 16.4*  --  18.3*  CREATININE 1.25* 1.36*  --  0.96  LATICACIDVEN  --   --  2.0* 1.3    Estimated Creatinine Clearance: 148.3 mL/min (by C-G formula based on SCr of 0.96 mg/dL).    Allergies  Allergen Reactions   Tramadol Nausea And Vomiting   Vicodin [Hydrocodone-Acetaminophen] Nausea And Vomiting    Antimicrobials this admission: 6/21 cefepime >>  6/21 Vanco >>  6/21 Zosyn >> 6/21   Microbiology results: 6/19 BCx: ngtd [D2]  Thank you for allowing pharmacy to be a part of this patient's care.  7/19 BS, PharmD, BCPS Clinical Pharmacist 05/15/2022 5:10 PM  Contact: [phone] after 3 PM  "Be curious, not judgmental..." [phone]

## 2022-05-15 NOTE — Assessment & Plan Note (Signed)
Calculated BMI is 48,2

## 2022-05-15 NOTE — ED Provider Notes (Signed)
AP-EMERGENCY DEPT [facility] Emergency Department Provider Note MRN:  [id number]  Arrival date & time: 05/15/22     Chief Complaint   Wound Infection   History of Present Illness   [name] is a 46 y.o. year-old male with a history of hypertension presenting to the ED with chief complaint of wound infection.  Patient explains that he gives up, he was unable to heal at home and now he is willing to be admitted.  He was diagnosed with cellulitis a few days ago and left the hospital AGAINST MEDICAL ADVICE.  He has been taking doxycycline and it is not helping.  Denies fever, endorsing continued pain, swelling, redness to the right lower extremity.  No other complaints.  Review of Systems  A thorough review of systems was obtained and all systems are negative except as noted in the HPI and PMH.   Patient's Health History    Past Medical History:  Diagnosis Date   ADHD (attention deficit hyperactivity disorder)    Anxiety    Arthritis    knee and back   Chronic back pain    Depression    GERD (gastroesophageal reflux disease)    Hypertension    Neuropathy    Obesity    PONV (postoperative nausea and vomiting)     Past Surgical History:  Procedure Laterality Date   BACK SURGERY     CARPAL TUNNEL RELEASE Left    HAND SURGERY     KNEE SURGERY Right    TONSILLECTOMY      Family History  Problem Relation Age of Onset   Heart failure Mother    Heart disease Mother    Diabetes Father    Stroke Brother     Social History   Socioeconomic History   Marital status: Single    Spouse name: Not on file   Number of children: Not on file   Years of education: Not on file   Highest education level: Not on file  Occupational History   Not on file  Tobacco Use   Smoking status: Every Day    Packs/day: 0.50    Years: 18.00    Total pack years: 9.00    Types: Cigarettes   Smokeless tobacco: Never  Vaping Use   Vaping Use: Never used  Substance and Sexual  Activity   Alcohol use: No    Comment: quit 2009   Drug use: No   Sexual activity: Not on file  Other Topics Concern   Not on file  Social History Narrative   Not on file   Social Determinants of Health   Financial Resource Strain: Not on file  Food Insecurity: Not on file  Transportation Needs: Not on file  Physical Activity: Not on file  Stress: Not on file  Social Connections: Not on file  Intimate Partner Violence: Not on file     Physical Exam   Vitals:   05/14/22 2159 05/15/22 0127  BP: (!) 132/96 (!) 150/96  Pulse: (!) 108 (!) 105  Resp: 20 17  Temp: 98.9 F (37.2 C)   SpO2: 100% 100%    CONSTITUTIONAL: Well-appearing, NAD NEURO/PSYCH:  Alert and oriented x 3, no focal deficits EYES:  eyes equal and reactive ENT/NECK:  no LAD, no JVD CARDIO: Regular rate, well-perfused, normal S1 and S2 PULM:  CTAB no wheezing or rhonchi GI/GU:  non-distended, non-tender MSK/SPINE:  No gross deformities SKIN: Significant swelling, erythema, induration to the right lower extremity with blistering of skin,  wound with serous drainage   *Additional and/or pertinent findings included in MDM below  Diagnostic and Interventional Summary    EKG Interpretation  Date/Time:    Ventricular Rate:    PR Interval:    QRS Duration:   QT Interval:    QTC Calculation:   R Axis:     Text Interpretation:         Labs Reviewed  COMPREHENSIVE METABOLIC PANEL - Abnormal; Notable for the following components:      Result Value   Sodium 133 (*)    Potassium 2.9 (*)    Chloride 97 (*)    Glucose, Bld 119 (*)    BUN 21 (*)    Calcium 8.2 (*)    Albumin 2.3 (*)    Alkaline Phosphatase 132 (*)    All other components within normal limits  CBC WITH DIFFERENTIAL/PLATELET - Abnormal; Notable for the following components:   WBC 18.3 (*)    HCT 37.9 (*)    Platelets 401 (*)    Neutro Abs 12.7 (*)    Monocytes Absolute 1.8 (*)    Abs Immature Granulocytes 0.89 (*)    All other  components within normal limits  LACTIC ACID, PLASMA    CT EXTREMITY LOWER RIGHT W CONTRAST  Final Result      Medications  piperacillin-tazobactam (ZOSYN) IVPB 3.375 g (3.375 g Intravenous New Bag/Given 05/15/22 0102)  vancomycin (VANCOREADY) IVPB 1500 mg/300 mL (has no administration in time range)  HYDROmorphone (DILAUDID) injection 1 mg (has no administration in time range)  ondansetron (ZOFRAN) injection 4 mg (has no administration in time range)  potassium chloride SA (KLOR-CON M) CR tablet 40 mEq (has no administration in time range)  lactated ringers bolus 1,000 mL (0 mLs Intravenous Stopped 05/15/22 0325)  fentaNYL (SUBLIMAZE) injection 50 mcg (50 mcg Intravenous Given 05/15/22 0056)  vancomycin (VANCOREADY) IVPB 2000 mg/400 mL (0 mg Intravenous Stopped 05/15/22 0325)  iohexol (OMNIPAQUE) 300 MG/ML solution 100 mL (100 mLs Intravenous Contrast Given 05/15/22 0335)     Procedures  /  Critical Care Procedures  ED Course and Medical Decision Making  Initial Impression and Ddx Exam is concerning for deeper space infection, possibly abscess or osteomyelitis.  Vital signs reassuring, mild tachycardia, awaiting labs, CT of the leg.  Given the failed antibiotic course thus far, starting empiric Vanco, Zosyn.  Past medical/surgical history that increases complexity of ED encounter: Neuropathy  Interpretation of Diagnostics I personally reviewed the laboratory evaluation and my interpretation is as follows: Prominent leukocytosis, hypokalemia, otherwise no significant blood count or electrolyte disturbance  CT imaging revealing extensive cellulitis but no deeper space infection or subcutaneous gas.  Patient Reassessment and Ultimate Disposition/Management     Will admit to medicine.  Patient management required discussion with the following services or consulting groups:  Hospitalist Service  Complexity of Problems Addressed Acute illness or injury that poses threat of life of  bodily function  Additional Data Reviewed and Analyzed Further history obtained from: Prior ED visit notes and Prior labs/imaging results  Additional Factors Impacting ED Encounter Risk Use of parenteral controlled substances and Consideration of hospitalization  Elmer Sow. Pilar Plate, MD [facility] Emergency Medicine [facility] [email or web address]  Final Clinical Impressions(s) / ED Diagnoses     ICD-10-CM   1. Cellulitis of right lower extremity  L03.115       ED Discharge Orders     None        Discharge Instructions Discussed with and Provided  to Patient:   Discharge Instructions   None      Sabas Sous, MD 05/15/22 [phone]

## 2022-05-16 ENCOUNTER — Ambulatory Visit (HOSPITAL_COMMUNITY): Admission: RE | Admit: 2022-05-16 | Payer: Medicare Other | Source: Ambulatory Visit

## 2022-05-16 DIAGNOSIS — L03115 Cellulitis of right lower limb: Secondary | ICD-10-CM | POA: Diagnosis not present

## 2022-05-16 DIAGNOSIS — K219 Gastro-esophageal reflux disease without esophagitis: Secondary | ICD-10-CM | POA: Diagnosis not present

## 2022-05-16 DIAGNOSIS — I10 Essential (primary) hypertension: Secondary | ICD-10-CM | POA: Diagnosis not present

## 2022-05-16 LAB — CBC
HCT: 36.9 % — ABNORMAL LOW (ref 39.0–52.0)
Hemoglobin: 12.6 g/dL — ABNORMAL LOW (ref 13.0–17.0)
MCH: 29.5 pg (ref 26.0–34.0)
MCHC: 34.1 g/dL (ref 30.0–36.0)
MCV: 86.4 fL (ref 80.0–100.0)
Platelets: 365 10*3/uL (ref 150–400)
RBC: 4.27 MIL/uL (ref 4.22–5.81)
RDW: 13.4 % (ref 11.5–15.5)
WBC: 12.8 10*3/uL — ABNORMAL HIGH (ref 4.0–10.5)
nRBC: 0 % (ref 0.0–0.2)

## 2022-05-16 LAB — COMPREHENSIVE METABOLIC PANEL
ALT: 25 U/L (ref 0–44)
AST: 27 U/L (ref 15–41)
Albumin: 2.1 g/dL — ABNORMAL LOW (ref 3.5–5.0)
Alkaline Phosphatase: 97 U/L (ref 38–126)
Anion gap: 6 (ref 5–15)
BUN: 16 mg/dL (ref 6–20)
CO2: 28 mmol/L (ref 22–32)
Calcium: 8.2 mg/dL — ABNORMAL LOW (ref 8.9–10.3)
Chloride: 100 mmol/L (ref 98–111)
Creatinine, Ser: 0.69 mg/dL (ref 0.61–1.24)
GFR, Estimated: >60 mL/min (ref >60)
Glucose, Bld: 105 mg/dL — ABNORMAL HIGH (ref 70–99)
Potassium: 4.1 mmol/L (ref 3.5–5.1)
Sodium: 134 mmol/L — ABNORMAL LOW (ref 135–145)
Total Bilirubin: 0.8 mg/dL (ref 0.3–1.2)
Total Protein: 7.6 g/dL (ref 6.5–8.1)

## 2022-05-16 NOTE — Progress Notes (Signed)
Wound change completed

## 2022-05-16 NOTE — Progress Notes (Signed)
Pt awake first half of shift, fall asleep in early morning hours. Medicated x2 with reports of right leg pain, partial effects noted.

## 2022-05-16 NOTE — Progress Notes (Signed)
  Progress Note   Patient: [name] VHQ:[id number] DOB: 08-30-1976 DOA: 05/14/2022     1 DOS: the patient was seen and examined on 05/16/2022   Brief hospital course: Mr. Balan was admitted to the hospital with the working diagnosis of cellulitis/ wound infection   46 yo male with the past medical history of hypertension, GERD, ADHD, depression and obesity who presented with right leg pain. Positive erythema and edema on his right leg. On 06/16 he left the ED AMA, but because worsening symptoms he returned. On his initial physical examination his blood pressure was 150/96, HR 105, RR 17 and 02 saturation 100%, lungs were clear to auscultation, heart with S1 and S2 present and rhythmic, abdomen soft and right lower extremity edema with erythema and ruptured blisters.   Na 132, K 2,8 Cl 97 bicarbonate 25 glucose 210 bun 35 cr 1,36  Lactic acid 2,0  Wbc 16,4 hgb 13,5 plt 315   Patient was placed on broad spectrum antibiotic therapy with improvement in wound infection.  He has been afebrile with improvement in leg pain. No nausea or vomiting, no diarrhea.   Plan to discharge home tomorrow if wound continue to improve.   Assessment and Plan: * Cellulitis of right lower extremity Leukocytosis is improving with wbc today at 12.8 Patient continue to be afebrile.  Daily dressing changes.  Cultures continue with no growth.   On IV antibiotic therapy with Vancomycin, and cefepime Follow cell count and temperature curve.  Possible discharge home tomorrow with oral antibiotic therapy.  Out of bed to chair tid with meals Pt and Ot.    Essential hypertension, benign Improved blood pressure control with systolic at 150 to 160 mmHg. Continue with losartan.   GERD (gastroesophageal reflux disease) Continue with ppi  Hypoalbuminemia due to protein-calorie malnutrition (HCC) Continue with nutritional supplements.   AKI (acute kidney injury) (HCC) Hyponatremia, hypokalemia   Renal  function today with serum cr at 0,69, K is 4,1 and serum bicarbonate at 28. Na 134.   Plan to continue close follow up on electrolytes. Vancomycin dosing per pharmacy protocol.   Class 3 obesity (HCC) Calculated BMI is 48,2         Subjective: Patient is feeling better, right leg pain is improving. No nausea or vomiting, no diarrhea.   Physical Exam: Vitals:   05/15/22 1044 05/15/22 2036 05/16/22 0548 05/16/22 1222  BP: (!) 151/77 (!) 149/76 (!) 157/86 (!) 169/81  Pulse: 82 87  83  Resp: 17 19 17 20   Temp: 98.2 F (36.8 C) 98.6 F (37 C) 98.7 F (37.1 C) 97.7 F (36.5 C)  TempSrc: Oral  Oral Oral  SpO2: 99% 100% 99% 98%  Weight:      Height:       Neurology awake and alert ENT with no pallor Cardiovascular with S1 and S2 present and rhythmic with no gallops Respiratory with no rales Abdomen not distended Right lower extremity with dressing in place.  Data Reviewed:    Family Communication: no family at the bedside   Disposition: Status is: Inpatient Remains inpatient appropriate because: IV antibiotic therapy, possible dc home tomorrow   Planned Discharge Destination: Home  Author: , MD 05/16/2022 2:46 PM  For on call review [email or web address].

## 2022-05-17 DIAGNOSIS — N179 Acute kidney failure, unspecified: Secondary | ICD-10-CM | POA: Diagnosis not present

## 2022-05-17 DIAGNOSIS — L03115 Cellulitis of right lower limb: Secondary | ICD-10-CM | POA: Diagnosis not present

## 2022-05-17 DIAGNOSIS — K219 Gastro-esophageal reflux disease without esophagitis: Secondary | ICD-10-CM | POA: Diagnosis not present

## 2022-05-17 DIAGNOSIS — I10 Essential (primary) hypertension: Secondary | ICD-10-CM | POA: Diagnosis not present

## 2022-05-17 LAB — CBC
HCT: 34 % — ABNORMAL LOW (ref 39.0–52.0)
Hemoglobin: 11.5 g/dL — ABNORMAL LOW (ref 13.0–17.0)
MCH: 29.6 pg (ref 26.0–34.0)
MCHC: 33.8 g/dL (ref 30.0–36.0)
MCV: 87.4 fL (ref 80.0–100.0)
Platelets: 395 10*3/uL (ref 150–400)
RBC: 3.89 MIL/uL — ABNORMAL LOW (ref 4.22–5.81)
RDW: 13.4 % (ref 11.5–15.5)
WBC: 11.3 10*3/uL — ABNORMAL HIGH (ref 4.0–10.5)
nRBC: 0 % (ref 0.0–0.2)

## 2022-05-17 MED ORDER — ACETAMINOPHEN 325 MG PO TABS: 650.0000 mg | ORAL_TABLET | Freq: Four times a day (QID) | ORAL | Status: DC | PRN

## 2022-05-17 MED ORDER — ACETAMINOPHEN 325 MG PO TABS
650.0000 mg | ORAL_TABLET | Freq: Four times a day (QID) | ORAL | Status: AC | PRN
Start: 2022-05-17 — End: ?

## 2022-05-17 MED ORDER — SULFAMETHOXAZOLE-TRIMETHOPRIM 800-160 MG PO TABS: 1.0000 | ORAL_TABLET | Freq: Two times a day (BID) | ORAL | 0 refills | Status: AC

## 2022-05-17 MED ORDER — SULFAMETHOXAZOLE-TRIMETHOPRIM 800-160 MG PO TABS: 1.0000 | ORAL_TABLET | Freq: Two times a day (BID) | ORAL | Status: DC

## 2022-05-17 MED ORDER — IBUPROFEN 400 MG PO TABS: 400.0000 mg | ORAL_TABLET | Freq: Four times a day (QID) | ORAL | 0 refills | Status: AC | PRN

## 2022-05-17 MED ORDER — LOSARTAN POTASSIUM 50 MG PO TABS: 25.0000 mg | ORAL_TABLET | Freq: Every day | ORAL | Status: DC

## 2022-05-17 MED ORDER — HYDROCHLOROTHIAZIDE 25 MG PO TABS
25.0000 mg | ORAL_TABLET | Freq: Every day | ORAL | Status: DC
Start: 2022-05-18 — End: 2022-05-17

## 2022-05-17 MED ORDER — LOSARTAN POTASSIUM 25 MG PO TABS: 25.0000 mg | ORAL_TABLET | Freq: Every day | ORAL | 0 refills | Status: AC

## 2022-05-17 MED ORDER — IBUPROFEN 400 MG PO TABS: 400.0000 mg | ORAL_TABLET | Freq: Four times a day (QID) | ORAL | Status: DC | PRN

## 2022-05-17 NOTE — Progress Notes (Addendum)
Pt has discharge orders, discharge teaching given and no further questions at this time. Wound care teaching was done and enough supplies was given for a total of 3 days. RLE wound cleaned and redressed. Pt understands teaching on wound care.Pt wheeled down to main entrance via w/c by staff. Pts brother picked him up at main entrance.

## 2022-05-18 LAB — CULTURE, BLOOD (ROUTINE X 2)
Culture: NO GROWTH
Culture: NO GROWTH
Special Requests: ADEQUATE
Special Requests: ADEQUATE

## 2022-05-31 ENCOUNTER — Encounter (HOSPITAL_BASED_OUTPATIENT_CLINIC_OR_DEPARTMENT_OTHER): Payer: Medicare Other | Attending: Internal Medicine | Admitting: Internal Medicine

## 2022-05-31 DIAGNOSIS — L97812 Non-pressure chronic ulcer of other part of right lower leg with fat layer exposed: Secondary | ICD-10-CM | POA: Diagnosis not present

## 2022-05-31 DIAGNOSIS — M48062 Spinal stenosis, lumbar region with neurogenic claudication: Secondary | ICD-10-CM | POA: Diagnosis not present

## 2022-05-31 DIAGNOSIS — I87311 Chronic venous hypertension (idiopathic) with ulcer of right lower extremity: Secondary | ICD-10-CM | POA: Diagnosis not present

## 2022-05-31 DIAGNOSIS — L03115 Cellulitis of right lower limb: Secondary | ICD-10-CM

## 2022-06-03 ENCOUNTER — Encounter (HOSPITAL_COMMUNITY): Payer: Self-pay

## 2022-06-03 ENCOUNTER — Inpatient Hospital Stay (HOSPITAL_COMMUNITY)
Admission: EM | Admit: 2022-06-03 | Discharge: 2022-06-04 | DRG: 603 | Discharge status: Against Medical Advice | Payer: Medicare Other | Attending: Internal Medicine | Admitting: Internal Medicine

## 2022-06-03 ENCOUNTER — Emergency Department (HOSPITAL_COMMUNITY): Payer: Medicare Other

## 2022-06-03 ENCOUNTER — Other Ambulatory Visit: Payer: Self-pay

## 2022-06-03 DIAGNOSIS — K219 Gastro-esophageal reflux disease without esophagitis: Secondary | ICD-10-CM | POA: Diagnosis present

## 2022-06-03 DIAGNOSIS — G629 Polyneuropathy, unspecified: Secondary | ICD-10-CM | POA: Diagnosis present

## 2022-06-03 DIAGNOSIS — Z823 Family history of stroke: Secondary | ICD-10-CM

## 2022-06-03 DIAGNOSIS — E66813 Obesity, class 3: Secondary | ICD-10-CM | POA: Diagnosis present

## 2022-06-03 DIAGNOSIS — Z79899 Other long term (current) drug therapy: Secondary | ICD-10-CM | POA: Diagnosis not present

## 2022-06-03 DIAGNOSIS — E8809 Other disorders of plasma-protein metabolism, not elsewhere classified: Secondary | ICD-10-CM | POA: Diagnosis present

## 2022-06-03 DIAGNOSIS — F17210 Nicotine dependence, cigarettes, uncomplicated: Secondary | ICD-10-CM | POA: Diagnosis present

## 2022-06-03 DIAGNOSIS — E46 Unspecified protein-calorie malnutrition: Secondary | ICD-10-CM | POA: Diagnosis present

## 2022-06-03 DIAGNOSIS — R609 Edema, unspecified: Secondary | ICD-10-CM | POA: Diagnosis not present

## 2022-06-03 DIAGNOSIS — Z8249 Family history of ischemic heart disease and other diseases of the circulatory system: Secondary | ICD-10-CM

## 2022-06-03 DIAGNOSIS — Z6841 Body Mass Index (BMI) 40.0 and over, adult: Secondary | ICD-10-CM | POA: Diagnosis not present

## 2022-06-03 DIAGNOSIS — F32A Depression, unspecified: Secondary | ICD-10-CM | POA: Diagnosis present

## 2022-06-03 DIAGNOSIS — Z720 Tobacco use: Secondary | ICD-10-CM | POA: Diagnosis present

## 2022-06-03 DIAGNOSIS — L03115 Cellulitis of right lower limb: Principal | ICD-10-CM | POA: Diagnosis present

## 2022-06-03 DIAGNOSIS — L538 Other specified erythematous conditions: Secondary | ICD-10-CM | POA: Diagnosis not present

## 2022-06-03 DIAGNOSIS — D649 Anemia, unspecified: Secondary | ICD-10-CM | POA: Diagnosis present

## 2022-06-03 DIAGNOSIS — I10 Essential (primary) hypertension: Secondary | ICD-10-CM | POA: Diagnosis present

## 2022-06-03 DIAGNOSIS — Z833 Family history of diabetes mellitus: Secondary | ICD-10-CM | POA: Diagnosis not present

## 2022-06-03 DIAGNOSIS — R52 Pain, unspecified: Secondary | ICD-10-CM | POA: Diagnosis not present

## 2022-06-03 LAB — MAGNESIUM: Magnesium: 1.8 mg/dL (ref 1.7–2.4)

## 2022-06-03 LAB — CBC WITH DIFFERENTIAL/PLATELET
Abs Immature Granulocytes: 0.01 10*3/uL (ref 0.00–0.07)
Basophils Absolute: 0 10*3/uL (ref 0.0–0.1)
Basophils Relative: 0 %
Eosinophils Absolute: 0.4 10*3/uL (ref 0.0–0.5)
Eosinophils Relative: 6 %
HCT: 32.3 % — ABNORMAL LOW (ref 39.0–52.0)
Hemoglobin: 10.5 g/dL — ABNORMAL LOW (ref 13.0–17.0)
Immature Granulocytes: 0 %
Lymphocytes Relative: 29 %
Lymphs Abs: 2.2 10*3/uL (ref 0.7–4.0)
MCH: 29.6 pg (ref 26.0–34.0)
MCHC: 32.5 g/dL (ref 30.0–36.0)
MCV: 91 fL (ref 80.0–100.0)
Monocytes Absolute: 0.7 10*3/uL (ref 0.1–1.0)
Monocytes Relative: 10 %
Neutro Abs: 4.1 10*3/uL (ref 1.7–7.7)
Neutrophils Relative %: 55 %
Platelets: 266 10*3/uL (ref 150–400)
RBC: 3.55 MIL/uL — ABNORMAL LOW (ref 4.22–5.81)
RDW: 13.4 % (ref 11.5–15.5)
WBC: 7.4 10*3/uL (ref 4.0–10.5)
nRBC: 0 % (ref 0.0–0.2)

## 2022-06-03 LAB — COMPREHENSIVE METABOLIC PANEL
ALT: 19 U/L (ref 0–44)
AST: 24 U/L (ref 15–41)
Albumin: 2.7 g/dL — ABNORMAL LOW (ref 3.5–5.0)
Alkaline Phosphatase: 98 U/L (ref 38–126)
Anion gap: 6 (ref 5–15)
BUN: 26 mg/dL — ABNORMAL HIGH (ref 6–20)
CO2: 29 mmol/L (ref 22–32)
Calcium: 8.8 mg/dL — ABNORMAL LOW (ref 8.9–10.3)
Chloride: 102 mmol/L (ref 98–111)
Creatinine, Ser: 0.97 mg/dL (ref 0.61–1.24)
GFR, Estimated: >60 mL/min (ref >60)
Glucose, Bld: 102 mg/dL — ABNORMAL HIGH (ref 70–99)
Potassium: 4.9 mmol/L (ref 3.5–5.1)
Sodium: 137 mmol/L (ref 135–145)
Total Bilirubin: 0.3 mg/dL (ref 0.3–1.2)
Total Protein: 8.1 g/dL (ref 6.5–8.1)

## 2022-06-03 LAB — RETICULOCYTES
Immature Retic Fract: 17.9 % — ABNORMAL HIGH (ref 2.3–15.9)
RBC.: 3.72 MIL/uL — ABNORMAL LOW (ref 4.22–5.81)
Retic Count, Absolute: 87 10*3/uL (ref 19.0–186.0)
Retic Ct Pct: 2.3 % (ref 0.4–3.1)

## 2022-06-03 LAB — VITAMIN B12: Vitamin B-12: 354 pg/mL (ref 180–914)

## 2022-06-03 LAB — FERRITIN: Ferritin: 78 ng/mL (ref 24–336)

## 2022-06-03 LAB — CK: Total CK: 54 U/L (ref 49–397)

## 2022-06-03 LAB — PHOSPHORUS: Phosphorus: 3.8 mg/dL (ref 2.5–4.6)

## 2022-06-03 LAB — IRON AND TIBC
Iron: 38 ug/dL — ABNORMAL LOW (ref 45–182)
Saturation Ratios: 12 % — ABNORMAL LOW (ref 17.9–39.5)
TIBC: 328 ug/dL (ref 250–450)
UIBC: 290 ug/dL

## 2022-06-03 LAB — FOLATE: Folate: 10 ng/mL (ref >5.9)

## 2022-06-03 LAB — TSH: TSH: 2.898 u[IU]/mL (ref 0.350–4.500)

## 2022-06-03 LAB — LACTIC ACID, PLASMA
Lactic Acid, Venous: 1.6 mmol/L (ref 0.5–1.9)
Lactic Acid, Venous: 1.3 mmol/L (ref 0.5–1.9)

## 2022-06-03 MED ORDER — SODIUM CHLORIDE 0.9 % IV SOLN
2.0000 g | Freq: Once | INTRAVENOUS | Status: AC
  Administered 2022-06-03: 2 g via INTRAVENOUS
  Filled 2022-06-03: qty 20

## 2022-06-03 MED ORDER — OXYCODONE HCL 5 MG PO TABS
5.0000 mg | ORAL_TABLET | Freq: Four times a day (QID) | ORAL | Status: DC | PRN
  Administered 2022-06-04 (×2): 5 mg via ORAL
  Filled 2022-06-03 (×2): qty 1

## 2022-06-03 MED ORDER — VANCOMYCIN HCL 2000 MG/400ML IV SOLN
2000.0000 mg | Freq: Once | INTRAVENOUS | Status: AC
  Administered 2022-06-03: 2000 mg via INTRAVENOUS
  Filled 2022-06-03: qty 400

## 2022-06-03 MED ORDER — FENTANYL CITRATE PF 50 MCG/ML IJ SOSY
50.0000 ug | PREFILLED_SYRINGE | Freq: Once | INTRAMUSCULAR | Status: AC
  Administered 2022-06-03: 50 ug via INTRAVENOUS
  Filled 2022-06-03: qty 1

## 2022-06-03 MED ORDER — SODIUM CHLORIDE 0.9 % IV SOLN
1.0000 g | INTRAVENOUS | Status: DC
Start: 2022-06-04 — End: 2022-06-03

## 2022-06-03 MED ORDER — SODIUM CHLORIDE 0.9 % IV SOLN: 2.0000 g | Freq: Once | INTRAVENOUS | Status: DC

## 2022-06-03 MED ORDER — SODIUM CHLORIDE 0.9 % IV BOLUS
1000.0000 mL | Freq: Once | INTRAVENOUS | Status: DC
  Administered 2022-06-03: 1000 mL via INTRAVENOUS

## 2022-06-03 MED ORDER — SODIUM CHLORIDE 0.9 % IV SOLN
2.0000 g | INTRAVENOUS | Status: DC
  Administered 2022-06-04: 2 g via INTRAVENOUS
  Filled 2022-06-03: qty 20

## 2022-06-03 MED ORDER — NICOTINE 14 MG/24HR TD PT24
14.0000 mg | MEDICATED_PATCH | Freq: Every day | TRANSDERMAL | Status: DC
  Administered 2022-06-04: 14 mg via TRANSDERMAL
  Filled 2022-06-03: qty 1

## 2022-06-03 MED ORDER — MORPHINE SULFATE (PF) 4 MG/ML IV SOLN
4.0000 mg | Freq: Once | INTRAVENOUS | Status: AC
  Administered 2022-06-03: 4 mg via INTRAVENOUS
  Filled 2022-06-03: qty 1

## 2022-06-03 MED ORDER — PANTOPRAZOLE SODIUM 40 MG PO TBEC
40.0000 mg | DELAYED_RELEASE_TABLET | Freq: Every day | ORAL | Status: DC
  Administered 2022-06-04: 40 mg via ORAL
  Filled 2022-06-03: qty 1

## 2022-06-03 MED ORDER — VANCOMYCIN HCL 1500 MG/300ML IV SOLN
1500.0000 mg | Freq: Two times a day (BID) | INTRAVENOUS | Status: DC
  Administered 2022-06-04 (×2): 1500 mg via INTRAVENOUS
  Filled 2022-06-03 (×3): qty 300

## 2022-06-03 MED ORDER — HYDROMORPHONE HCL 1 MG/ML IJ SOLN
0.5000 mg | INTRAMUSCULAR | Status: DC | PRN
  Administered 2022-06-04 (×2): 0.5 mg via INTRAVENOUS
  Filled 2022-06-03 (×3): qty 0.5

## 2022-06-03 MED ORDER — SODIUM CHLORIDE 0.9 % IV SOLN: INTRAVENOUS | Status: AC

## 2022-06-03 NOTE — Progress Notes (Addendum)
[name], [name] ([id number]) Visit Report for 05/31/2022 Allergy List Details Patient Name: Date of Service: Bobby LA Elise Benne [name] 05/31/2022 10:45 A M Medical Record Number: [id number] Patient Account Number: [id number] Date of Birth/Sex: Treating RN: [name] (46 y.o. [name], [name] Primary Care Angeleah Labrake: Loura Back Other Clinician: Referring Annibelle Brazie: Treating Julius Matus/Extender: Melody Comas Weeks in Treatment: 0 Allergies Active Allergies tramadol Vicodin Allergy Notes Electronic Signature(s) Signed: 06/03/2022 4:44:20 PM By: Fonnie Mu RN Entered By: Fonnie Mu on 05/31/2022 10:36:59 -------------------------------------------------------------------------------- Arrival Information Details Patient Name: Date of Service: [name], Bobby [name] 05/31/2022 10:45 A M Medical Record Number: [id number] Patient Account Number: [id number] Date of Birth/Sex: Treating RN: [name][birth year] (45 y.o. [name], [name] Primary Care Jasia Hiltunen: Loura Back Other Clinician: Referring Kiyah Demartini: Treating Jolean Madariaga/Extender: Narda Bonds in Treatment: 0 Visit Information Patient Arrived: Ambulatory Arrival Time: 11:04 Accompanied By: EPPI Transfer Assistance: None Patient Identification Verified: Yes Secondary Verification Process Completed: Yes Patient Requires Transmission-Based Precautions: No Patient Has Alerts: No Electronic Signature(s) Signed: 06/03/2022 4:44:20 PM By: Fonnie Mu RN Entered By: Fonnie Mu on 05/31/2022 11:05:11 -------------------------------------------------------------------------------- Clinic Level of Care Assessment Details Patient Name: Date of Service: Bobby LA Elise Benne [name] 05/31/2022 10:45 A M Medical Record Number: [id number] Patient Account Number: [id number] Date of Birth/Sex: Treating RN: [name], [name] (45 y.o. [name], [name] Primary Care Siyon Linck: Loura Back Other  Clinician: Referring Amarilys Lyles: Treating Tuesday Terlecki/Extender: Narda Bonds in Treatment: 0 Clinic Level of Care Assessment Items TOOL 4 Quantity Score X- 1 0 Use when only an EandM is performed on FOLLOW-UP visit ASSESSMENTS - Nursing Assessment / Reassessment X- 1 10 Reassessment of Co-morbidities (includes updates in patient status) X- 1 5 Reassessment of Adherence to Treatment Plan ASSESSMENTS - Wound and Skin A ssessment / Reassessment X - Simple Wound Assessment / Reassessment - one wound 1 5 []  - 0 Complex Wound Assessment / Reassessment - multiple wounds []  - 0 Dermatologic / Skin Assessment (not related to wound area) ASSESSMENTS - Focused Assessment X- 1 5 Circumferential Edema Measurements - multi extremities []  - 0 Nutritional Assessment / Counseling / Intervention []  - 0 Lower Extremity Assessment (monofilament, tuning fork, pulses) []  - 0 Peripheral Arterial Disease Assessment (using hand held doppler) ASSESSMENTS - Ostomy and/or Continence Assessment and Care []  - 0 Incontinence Assessment and Management []  - 0 Ostomy Care Assessment and Management (repouching, etc.) PROCESS - Coordination of Care []  - 0 Simple Patient / Family Education for ongoing care X- 1 20 Complex (extensive) Patient / Family Education for ongoing care X- 1 10 Staff obtains , Records, T Results / Process Orders est X- 1 10 Staff telephones HHA, Nursing Homes / Clarify orders / etc []  - 0 Routine Transfer to another Facility (non-emergent condition) []  - 0 Routine Hospital Admission (non-emergent condition) X- 1 15 New Admissions / / Ordering NPWT Apligraf, etc. , []  - 0 Emergency Hospital Admission (emergent condition) []  - 0 Simple Discharge Coordination X- 1 15 Complex (extensive) Discharge Coordination PROCESS - Special Needs []  - 0 Pediatric / Minor Patient Management []  - 0 Isolation Patient Management []  -  0 Hearing / Language / Visual special needs []  - 0 Assessment of Community assistance (transportation, D/C planning, etc.) []  - 0 Additional assistance / Altered mentation []  - 0 Support Surface(s) Assessment (bed, cushion, seat, etc.) INTERVENTIONS - Wound Cleansing / Measurement X - Simple Wound Cleansing - one wound 1 5 []  -  0 Complex Wound Cleansing - multiple wounds X- 1 5 Wound Imaging (photographs - any number of wounds) []  - 0 Wound Tracing (instead of photographs) X- 1 5 Simple Wound Measurement - one wound []  - 0 Complex Wound Measurement - multiple wounds INTERVENTIONS - Wound Dressings []  - 0 Small Wound Dressing one or multiple wounds X- 1 15 Medium Wound Dressing one or multiple wounds []  - 0 Large Wound Dressing one or multiple wounds []  - 0 Application of Medications - topical []  - 0 Application of Medications - injection INTERVENTIONS - Miscellaneous []  - 0 External ear exam []  - 0 Specimen Collection (cultures, biopsies, blood, body fluids, etc.) []  - 0 Specimen(s) / Culture(s) sent or taken to Lab for analysis []  - 0 Patient Transfer (multiple staff / / Similar devices) []  - 0 Simple Staple / Suture removal (25 or less) []  - 0 Complex Staple / Suture removal (26 or more) []  - 0 Hypo / Hyperglycemic Management (close monitor of Blood Glucose) X- 1 15 Ankle / Brachial Index (ABI) - do not check if billed separately X- 1 5 Vital Signs Has the patient been seen at the hospital within the last three years: Yes Total Score: 145 Level Of Care: New/Established - Level 4 Electronic Signature(s) Signed: 06/03/2022 4:44:20 PM By: RN Entered By: on 05/31/2022 12:17:39 -------------------------------------------------------------------------------- Encounter Discharge Information Details Patient Name: Date of Service: [name], Bobby [name] 05/31/2022 10:45 A M Medical Record Number: Patient  Account Number: Date of Birth/Sex: Treating RN: [birth year]-[name] (45 y.o. , [name] Primary Care Oberia Beaudoin: Nurse, adult Other Clinician: Referring Wilkie Zenon: Treating Salayah Meares/Extender: in Treatment: 0 Encounter Discharge Information Items Discharge Condition: Stable Ambulatory Status: Ambulatory Discharge Destination: Home Transportation: Private Auto Accompanied By: Schedule Follow-up Appointment: Yes Clinical Summary of Care: Patient Declined Electronic Signature(s) Signed: 06/03/2022 4:44:20 PM By: 08/04/2022 RN Entered By: Fonnie Mu on 05/31/2022 12:18:34 -------------------------------------------------------------------------------- Lower Extremity Assessment Details Patient Name: Date of Service: [name], Bobby [name] 05/31/2022 10:45 A M Medical Record Number: 08/01/2022 Patient Account Number: [id number] Date of Birth/Sex: Treating RN: [name]-[birth year] (45 y.o. 8/20/[name], [name] Primary Care Kaysie Michelini: [name] Other Clinician: Referring Tyric Rodeheaver: Treating Tyrrell Stephens/Extender: Loura Back Weeks in Treatment: 0 Edema Assessment Assessed: Narda Bonds: No] Marva Panda: Yes] Edema: [Left: Ye] [Right: s] Calf Left: Right: Point of Measurement: 37 cm From Medial Instep 58 cm Ankle Left: Right: Point of Measurement: 10 cm From Medial Instep 37 cm Knee To Floor Left: Right: From Medial Instep 44 cm Vascular Assessment Pulses: Dorsalis Pedis Palpable: [Right:Yes] Posterior Tibial Palpable: [Right:Yes] Blood Pressure: Brachial: [Right:138] Ankle: [Right:Dorsalis Pedis: 158 1.14] Electronic Signature(s) Signed: 06/03/2022 4:44:20 PM By: Fonnie Mu RN Entered By: Fonnie Mu on 05/31/2022 11:40:04 -------------------------------------------------------------------------------- Multi Wound Chart Details Patient Name: Date of Service: Bobby LA 08/01/2022, Bobby [name] 05/31/2022 10:45 A  M Medical Record Number: [id number] Patient Account Number: 8/20/[name] Date of Birth/Sex: Treating RN: [name][birth year] (45 y.o. Loura Back, [name] Primary Care Ilsa Bonello: Melody Comas Other Clinician: Referring Carah Barrientes: Treating Chasitee Zenker/Extender: Kyra Searles in Treatment: 0 Vital Signs Height(in): 71 Pulse(bpm): 74 Weight(lbs): 345 Blood Pressure(mmHg): 138/85 Body Mass Index(BMI): 48.1 Temperature(F): 98.7 Respiratory Rate(breaths/min): 17 Photos: Photos: [Ramirez/A:Ramirez/A] Right, Circumferential Lower Leg Ramirez/A Ramirez/A Wound Location: Gradually Appeared Ramirez/A Ramirez/A Wounding Event: Cellulitis Ramirez/A Ramirez/A Primary Etiology: Venous Leg Ulcer Ramirez/A Ramirez/A Secondary Etiology: Hypertension, Neuropathy Ramirez/A Ramirez/A Comorbid History:  05/17/2022 Ramirez/A Ramirez/A Date Acquired: 0 Ramirez/A Ramirez/A Weeks of Treatment: Open Ramirez/A Ramirez/A Wound Status: No Ramirez/A Ramirez/A Wound Recurrence: 27x37x0.1 Ramirez/A Ramirez/A Measurements L x W x D (cm) 784.613 Ramirez/A Ramirez/A A (cm) : rea 78.461 Ramirez/A Ramirez/A Volume (cm) : 0.00% Ramirez/A Ramirez/A % Reduction in A rea: 0.00% Ramirez/A Ramirez/A % Reduction in Volume: Full Thickness With Exposed Support Ramirez/A Ramirez/A Classification: Structures Large Ramirez/A Ramirez/A Exudate A mount: Purulent Ramirez/A Ramirez/A Exudate Type: yellow, brown, green Ramirez/A Ramirez/A Exudate Color: Distinct, outline attached Ramirez/A Ramirez/A Wound Margin: None Present (0%) Ramirez/A Ramirez/A Granulation Amount: Large (67-100%) Ramirez/A Ramirez/A Necrotic Amount: Eschar Ramirez/A Ramirez/A Necrotic Tissue: Fat Layer (Subcutaneous Tissue): Yes Ramirez/A Ramirez/A Exposed Structures: Fascia: No Tendon: No Muscle: No Joint: No Bone: No None Ramirez/A Ramirez/A Epithelialization: Treatment Notes Electronic Signature(s) Signed: 05/31/2022 1:21:Ramirez PM By: Geralyn Corwin DO Signed: 06/03/2022 4:44:20 PM By: Fonnie Mu RN Entered By: Geralyn Corwin on 05/31/2022 12:15:07 -------------------------------------------------------------------------------- Multi-Disciplinary Care Plan Details Patient Name: Date of  Service: [name], Bobby [name] 05/31/2022 10:45 A M Medical Record Number: [id number] Patient Account Number: [id number] Date of Birth/Sex: Treating RN: [name][birth year] (45 y.o. [name], [name] Primary Care Milana Salay: Loura Back Other Clinician: Referring Beulah Capobianco: Treating Imane Burrough/Extender: Narda Bonds in Treatment: 0 Active Inactive Electronic Signature(s) Signed: 07/12/2022 4:36:44 PM By: Shawn Stall RN, BSN Signed: 08/23/2022 11:53:23 AM By: Fonnie Mu RN Previous Signature: 06/03/2022 4:44:20 PM Version By: Fonnie Mu RN Entered By: Shawn Stall on 07/12/2022 16:36:44 -------------------------------------------------------------------------------- Pain Assessment Details Patient Name: Date of Service: [name], Bobby [name] 05/31/2022 10:45 A M Medical Record Number: [id number] Patient Account Number: [id number] Date of Birth/Sex: Treating RN: [birth year] (45 y.o. [name], [name] Primary Care Taliyah Watrous: Loura Back Other Clinician: Referring Katerine Morua: Treating Sayla Golonka/Extender: Narda Bonds in Treatment: 0 Active Problems Location of Pain Severity and Description of Pain Patient Has Paino Yes Site Locations Pain Location: Generalized Pain, Pain in Ulcers With Dressing Change: Yes Duration of the Pain. Constant / Intermittento Intermittent Rate the pain. Current Pain Level: 7 Worst Pain Level: 10 Least Pain Level: 7 Tolerable Pain Level: 7 Character of Pain Describe the Pain: Aching Pain Management and Medication Current Pain Management: Medication: No Cold Application: No Rest: No Massage: No Activity: No T.RamirezN.S.: No Heat Application: No Leg drop or elevation: No Is the Current Pain Management Adequate: Adequate How does your wound impact your activities of daily livingo Sleep: No Bathing: No Appetite: No Relationship With Others: No Bladder Continence: No Emotions: No Bowel Continence:  No Work: No Toileting: No Drive: No Dressing: No Hobbies: No Electronic Signature(s) Signed: 06/03/2022 4:44:20 PM By: Fonnie Mu RN Entered By: Fonnie Mu on 05/31/2022 11:06:32 -------------------------------------------------------------------------------- Patient/Caregiver Education Details Patient Name: Date of Service: Bobby LA Clarnce Flock, Bobby [name] 7/7/2023andnbsp10:45 A M Medical Record Number: [id number] Patient Account Number: [id number] Date of Birth/Gender: Treating RN: [name][birth year] (46 y.o. Lucious Groves Primary Care Physician: Loura Back Other Clinician: Referring Physician: Treating Physician/Extender: Narda Bonds in Treatment: 0 Education Assessment Education Provided To: Patient Education Topics Provided Welcome T [facility]: o Methods: Explain/Verbal Responses: State content correctly Electronic Signature(s) Signed: 06/03/2022 4:44:20 PM By: Fonnie Mu RN Entered By: Fonnie Mu on 05/31/2022 12:06:21 -------------------------------------------------------------------------------- Wound Assessment Details Patient Name: Date of Service: Bobby LA Clarnce Flock, Bobby [name] 05/31/2022 10:45 A M Medical Record Number: [id number] Patient Account Number: [id number] Date of Birth/Sex: Treating RN: [name]-[birth year] (45  y.o. [name], [name] Primary Care Severin Bou: Loura Back Other Clinician: Referring Jessilynn Taft: Treating Okema Rollinson/Extender: Melody Comas Weeks in Treatment: 0 Wound Status Wound Number: 1 Primary Etiology: Cellulitis Wound Location: Right, Circumferential Lower Leg Secondary Etiology: Venous Leg Ulcer Wounding Event: Gradually Appeared Wound Status: Open Date Acquired: 05/17/2022 Comorbid History: Hypertension, Neuropathy Weeks Of Treatment: 0 Clustered Wound: No Photos Wound Measurements Length: (cm) 27 Width: (cm) 37 Depth: (cm) 0.1 Area: (cm) 784.613 Volume: (cm)  78.461 % Reduction in Area: 0% % Reduction in Volume: 0% Epithelialization: None Tunneling: No Undermining: No Wound Description Classification: Full Thickness With Exposed Support Structures Wound Margin: Distinct, outline attached Exudate Amount: Large Exudate Type: Purulent Exudate Color: yellow, brown, green Wound Bed Granulation Amount: None Present (0%) Necrotic Amount: Large (67-100%) Necrotic Quality: Eschar Foul Odor After Cleansing: No Slough/Fibrino Yes Exposed Structure Fascia Exposed: No Fat Layer (Subcutaneous Tissue) Exposed: Yes Tendon Exposed: No Muscle Exposed: No Joint Exposed: No Bone Exposed: No Electronic Signature(s) Signed: 06/03/2022 4:44:20 PM By: Fonnie Mu RN Entered By: Fonnie Mu on 05/31/2022 12:04:05 -------------------------------------------------------------------------------- Vitals Details Patient Name: Date of Service: [name], Bobby [name] 05/31/2022 10:45 A M Medical Record Number: [id number] Patient Account Number: [id number] Date of Birth/Sex: Treating RN: [birth year]-[name] (46 y.o. [name], [name] Primary Care Xee Hollman: Loura Back Other Clinician: Referring Germani Gavilanes: Treating Ashrith Sagan/Extender: Narda Bonds in Treatment: 0 Vital Signs Time Taken: 11:11 Temperature (F): 98.7 Height (in): 71 Pulse (bpm): 74 Source: Stated Respiratory Rate (breaths/min): 17 Weight (lbs): 345 Blood Pressure (mmHg): 138/85 Body Mass Index (BMI): 48.1 Reference Range: 80 - 120 mg / dl Electronic Signature(s) Signed: 06/03/2022 4:44:20 PM By: Fonnie Mu RN Entered By: Fonnie Mu on 05/31/2022 11:10:17

## 2022-06-03 NOTE — ED Provider Triage Note (Signed)
Emergency Medicine Provider Triage Evaluation Note  [name] , a 46 y.o. male  was evaluated in triage.  Pt complains of right leg infection, seeing Dr. Celene Squibb since 6/20, scheduled to go back tomorrow but told if not better he would need to be admitted. On Keflex x 2 weeks with another abx. Now with swelling to right inner thigh.  Review of Systems  Positive: Leg swelling, redness Negative: fever  Physical Exam  BP 128/86 (BP Location: Left Arm)   Pulse (!) 122   Temp 98.1 F (36.7 C) (Oral)   Resp 18   SpO2 98%  Gen:   Awake, no distress   Resp:  Normal effort  MSK:   Erythema right leg with swelling  Other:    Medical Decision Making  Medically screening exam initiated at 3:21 PM.  Appropriate orders placed.  [name] was informed that the remainder of the evaluation will be completed by another provider, this initial triage assessment does not replace that evaluation, and the importance of remaining in the ED until their evaluation is complete.     Jeannie Fend, PA-C 06/03/22 [phone]

## 2022-06-03 NOTE — Progress Notes (Signed)
RLE venous duplex has been completed.  Preliminary results given to Army Melia, PA-C.   Results can be found under chart review under CV PROC. 06/03/2022 4:16 PM Brek Reece RVT, RDMS

## 2022-06-03 NOTE — Assessment & Plan Note (Signed)
Continue Protonix 40 mg daily

## 2022-06-03 NOTE — Progress Notes (Signed)
[name], [name] ([id number]) Visit Report for 05/31/2022 Chief Complaint Document Details Patient Name: Date of Service: [place] [name] [name] 05/31/2022 10:45 A M Medical Record Number: [id number] Patient Account Number: [id number] Date of Birth/Sex: Treating RN: 18-May-1976 (46 y.o. [name], [name] Primary Care Ramirez: [name] Other Clinician: Referring Ramirez: Treating Ramirez/Extender: [name] in Treatment: 0 Information Obtained from: Patient Chief Complaint 05/31/2022; history of right lower extremity cellulitis with wounds Electronic Signature(s) Signed: 05/31/2022 1:21:19 PM By: Bobby Shan DO Entered By: [name] on 05/31/2022 12:15:23 -------------------------------------------------------------------------------- HPI Details Patient Name: Date of Service: CA LA [name], [name] 05/31/2022 10:45 A M Medical Record Number: [id number] Patient Account Number: [id number] Date of Birth/Sex: Treating RN: 1975/12/27 (46 y.o. [name]: [name] Other Clinician: Referring Ramirez: Treating Ramirez/Extender: [name] in Treatment: 0 History of Present Illness HPI Description: 05/31/2022 [name] is a 46 year old male with a past medical history of degenerative disc disease with spinal stenosis to the lumbar region and neurogenic claudication, essential hypertension that presents for Right lower extremity cellulitis with scattered open wounds. He was admitted to the hospital on 05/14/2022 for right lower extremity cellulitis and treated with IV antibiotics and transitioned to Bactrim At discharge. He states he completed this course and then had another course refilled because his symptoms were not improving. He currently has 6 days left on this. He reports serous drainage and has been changing his dressings which include Xeroform and ABD pads daily. He continues  to have increased redness and warmth to the right leg. He denies fever/chills, nausea/vomiting or purulent drainage. Electronic Signature(s) Signed: 05/31/2022 1:21:19 PM By: Bobby Shan DO Entered By: [name] on 05/31/2022 12:17:33 -------------------------------------------------------------------------------- Physical Exam Details Patient Name: Date of Service: CA LA [name], [name] 05/31/2022 10:45 A M Medical Record Number: [id number] Patient Account Number: [id number] Date of Birth/Sex: Treating RN: January 11, 1976 (46 y.o. [name]: Other Clinician: Arthur Ramirez Referring Ramirez: Treating Ramirez/Extender: [name] in Treatment: 0 Constitutional respirations regular, non-labored and within target range for patient.. Cardiovascular 2+ dorsalis pedis/posterior tibialis pulses. Psychiatric pleasant and cooperative. Notes Right lower extremity: Increased warmth and erythema throughout the leg with scattered open wounds and serous drainage Electronic Signature(s) Signed: 05/31/2022 1:21:19 PM By: Bobby Shan DO Entered By: [name] on 05/31/2022 12:46:33 -------------------------------------------------------------------------------- Physician Orders Details Patient Name: Date of Service: CA LA [name], [name] 05/31/2022 10:45 A M Medical Record Number: [id number] Patient Account Number: [id number] Date of Birth/Sex: Treating RN: 1976-09-27 (46 y.o. [name], [name] Primary Care Ramirez: [name] Other Clinician: Referring Ramirez: Treating Ramirez/Extender: [name] in Treatment: 0 Verbal / Phone Orders: No Diagnosis Coding ICD-10 Coding Code Description [phone] Non-pressure chronic ulcer of other part of right lower leg with fat layer exposed I87.312 Chronic venous hypertension (idiopathic) with ulcer of left lower extremity M48.062 Spinal stenosis, lumbar  region with neurogenic claudication Follow-up Appointments ppointment in 1 week. - Tuesday 06/04/22 @ 1430 w/ Dr. Lou Ramirez and [name] Room # 9 Return A Bathing/ Shower/ Hygiene May shower with protection but do not get wound dressing(s) wet. Edema Control - Lymphedema / SCD / Other Elevate legs to the level of the heart or above for 30 minutes daily and/or when sitting, a frequency of: Avoid standing for long periods of time. Home Health New wound care orders this week;  continue Home Health for wound care. May utilize formulary equivalent dressing for wound treatment orders unless otherwise specified. - [facility] to change 2 x a week Wound Treatment Wound #1 - Lower Leg Wound Laterality: Right, Circumferential Cleanser: Soap and Water (Home Health) 2 x Per Week/15 Days Discharge Instructions: May shower and wash wound with dial antibacterial soap and water prior to dressing change. Peri-Wound Care: Zinc Oxide Ointment 30g tube (Home Health) 2 x Per Week/15 Days Discharge Instructions: Apply Zinc Oxide to periwound with each dressing change Peri-Wound Care: Sween Lotion (Moisturizing lotion) (Home Health) 2 x Per Week/15 Days Discharge Instructions: Apply moisturizing lotion as directed Prim Dressing: KerraCel Ag Gelling Fiber Dressing, 4x5 in (silver alginate) (Home Health) 2 x Per Week/15 Days ary Discharge Instructions: Apply silver alginate to wound bed as instructed Secondary Dressing: Woven Gauze Sponge, Non-Sterile 4x4 in (Home Health) 2 x Per Week/15 Days Discharge Instructions: Apply over primary dressing as directed. Secondary Dressing: Zetuvit Plus 4x8 in (Home Health) 2 x Per Week/15 Days Discharge Instructions: Apply over primary dressing as directed. Secured With: Elastic Bandage 4 inch (ACE bandage) (Home Health) 2 x Per Week/15 Days Discharge Instructions: Secure with ACE bandage as directed. Secured With: [employer], 4.5x3.1 (in/yd) (Home Health) 2 x Per  Week/15 Days Discharge Instructions: Secure with Kerlix as directed. Secured With: 63M Medipore H Soft Cloth Surgical T ape, 4 x 10 (in/yd) (Home Health) 2 x Per Week/15 Days Discharge Instructions: Secure with tape as directed. Secured With: [employer] certified [employer] Size 6, 10 (yds) (Home Health) 2 x Per Week/15 Days Patient Medications llergies: tramadol, Vicodin A Notifications Medication Indication Start End 05/31/2022 cephalexin DOSE 1 - oral 500 mg tablet - 1 tablet oral q6h x 7 days Electronic Signature(s) Signed: 05/31/2022 1:21:19 PM By: Geralyn Corwin DO Previous Signature: 05/31/2022 12:10:05 PM Version By: Geralyn Corwin DO Entered By: Geralyn Corwin on 05/31/2022 12:46:59 -------------------------------------------------------------------------------- Problem List Details Patient Name: Date of Service: CA LA [name], [name] 05/31/2022 10:45 A M Medical Record Number: [id number] Patient Account Number: [id number] Date of Birth/Sex: Treating RN: 05-10-1976 (45 y.o. Charlean Merl, [name] Primary Care Ramirez: Loura Back Other Clinician: Referring Ramirez: Treating Ramirez/Extender: Narda Bonds in Treatment: 0 Active Problems ICD-10 Encounter Code Description Active Date MDM Diagnosis [phone] Non-pressure chronic ulcer of other part of right lower leg with fat layer 05/31/2022 No Yes exposed L03.115 Cellulitis of right lower limb 05/31/2022 No Yes I87.311 Chronic venous hypertension (idiopathic) with ulcer of right lower extremity 05/31/2022 No Yes M48.062 Spinal stenosis, lumbar region with neurogenic claudication 05/31/2022 No Yes Inactive Problems Resolved Problems Electronic Signature(s) Signed: 05/31/2022 1:21:19 PM By: Geralyn Corwin DO Entered By: Geralyn Corwin on 05/31/2022 12:15:01 -------------------------------------------------------------------------------- Progress Note Details Patient Name: Date of Service: [place] LA [name], [name] 05/31/2022  10:45 A M Medical Record Number: [id number] Patient Account Number: [id number] Date of Birth/Sex: Treating RN: [birth year] (45 y.o. Charlean Merl, [name] Primary Care Ramirez: Loura Back Other Clinician: Referring Ramirez: Treating Ramirez/Extender: Narda Bonds in Treatment: 0 Subjective Chief Complaint Information obtained from Patient 05/31/2022; history of right lower extremity cellulitis with wounds History of Present Illness (HPI) 05/31/2022 Mr. Donevan Biller is a 46 year old male with a past medical history of degenerative disc disease with spinal stenosis to the lumbar region and neurogenic claudication, essential hypertension that presents for Right lower extremity cellulitis with scattered open wounds. He was admitted to the hospital on 05/14/2022 for right lower  extremity cellulitis and treated with IV antibiotics and transitioned to Bactrim At discharge. He states he completed this course and then had another course refilled because his symptoms were not improving. He currently has 6 days left on this. He reports serous drainage and has been changing his dressings which include Xeroform and ABD pads daily. He continues to have increased redness and warmth to the right leg. He denies fever/chills, nausea/vomiting or purulent drainage. Patient History Information obtained from Patient, Chart. Allergies tramadol, Vicodin Family History Unknown History. Social History Current every day smoker - 1/2 A PACK, Marital Status - Single, Alcohol Use - Never, Drug Use - No History, Caffeine Use - Moderate. Medical History Cardiovascular Patient has history of Hypertension Neurologic Patient has history of Neuropathy Hospitalization/Surgery History - RIGHT LEG CELLULITIS. - X 2 BACK SURGERIES W/ TITAINIUM PLACEMENT - X 2 HAND SURGERIES. - KNEE . SURGERY. Medical A Surgical History Notes nd Gastrointestinal GERD OF HX Review of Systems (ROS) Constitutional  Symptoms (General Health) Denies complaints or symptoms of Fatigue, Fever, Chills, Marked Weight Change. Eyes Denies complaints or symptoms of Dry Eyes, Vision Changes, Glasses / Contacts. Ear/Nose/Mouth/Throat Denies complaints or symptoms of Chronic sinus problems or rhinitis. Respiratory Denies complaints or symptoms of Chronic or frequent coughs, Shortness of Breath. Gastrointestinal Denies complaints or symptoms of Frequent diarrhea, Nausea, Vomiting. Endocrine Denies complaints or symptoms of Heat/cold intolerance. Genitourinary Denies complaints or symptoms of Frequent urination. Integumentary (Skin) Complains or has symptoms of Wounds. Musculoskeletal Denies complaints or symptoms of Muscle Pain, Muscle Weakness. Psychiatric Denies complaints or symptoms of Claustrophobia, Suicidal. Objective Constitutional respirations regular, non-labored and within target range for patient.. Vitals Time Taken: 11:11 AM, Height: 71 in, Source: Stated, Weight: 345 lbs, BMI: 48.1, Temperature: 98.7 F, Pulse: 74 bpm, Respiratory Rate: 17 breaths/min, Blood Pressure: 138/85 mmHg. Cardiovascular 2+ dorsalis pedis/posterior tibialis pulses. Psychiatric pleasant and cooperative. General Notes: Right lower extremity: Increased warmth and erythema throughout the leg with scattered open wounds and serous drainage Integumentary (Hair, Skin) Wound #1 status is Open. Original cause of wound was Gradually Appeared. The date acquired was: 05/17/2022. The wound is located on the Right,Circumferential Lower Leg. The wound measures 27cm length x 37cm width x 0.1cm depth; 784.613cm^2 area and 78.461cm^3 volume. There is Fat Layer (Subcutaneous Tissue) exposed. There is no tunneling or undermining noted. There is a large amount of purulent drainage noted. The wound margin is distinct with the outline attached to the wound base. There is no granulation within the wound bed. There is a large (67-100%) amount  of necrotic tissue within the wound bed including Eschar. Assessment Active Problems ICD-10 Non-pressure chronic ulcer of other part of right lower leg with fat layer exposed Cellulitis of right lower limb Chronic venous hypertension (idiopathic) with ulcer of right lower extremity Spinal stenosis, lumbar region with neurogenic claudication Patient presents with nonhealing ulcers to his right lower extremity. He was admitted to the hospital for cellulitis of the right lower extremity however patient states that his symptoms have not improved since discharge. This is despite oral antibiotics. He continues to have increased warmth and erythema to the right lower extremity. At this time I recommended he go to the ED for further evaluation and possible admission with IV antibiotics. He states he would like to treat this outpatient for at least the next week. I will go ahead and add Keflex to his course as Bactrim does not cover strep well. Can use silver alginate and absorbent pads with dressing changes. He  knows to go to the ED if he were to develop a fever, chills, nausea/vomiting and worsening of symptoms. Plan Follow-up Appointments: Return Appointment in 1 week. - Tuesday 06/04/22 @ 1430 w/ Dr. Lou Ramirez and [name] Room # 9 Bathing/ Shower/ Hygiene: May shower with protection but do not get wound dressing(s) wet. Edema Control - Lymphedema / SCD / Other: Elevate legs to the level of the heart or above for 30 minutes daily and/or when sitting, a frequency of: Avoid standing for long periods of time. Home Health: New wound care orders this week; continue Home Health for wound care. May utilize formulary equivalent dressing for wound treatment orders unless otherwise specified. - [place] to change 2 x a week The following medication(s) was prescribed: cephalexin oral 500 mg tablet 1 1 tablet oral q6h x 7 days starting 05/31/2022 WOUND #1: - Lower Leg Wound Laterality: Right,  Circumferential Cleanser: Soap and Water ([place]) 2 x Per Week/15 Days Discharge Instructions: May shower and wash wound with dial antibacterial soap and water prior to dressing change. Peri-Wound Care: Zinc Oxide Ointment 30g tube (Home Health) 2 x Per Week/15 Days Discharge Instructions: Apply Zinc Oxide to periwound with each dressing change Peri-Wound Care: Sween Lotion (Moisturizing lotion) (Home Health) 2 x Per Week/15 Days Discharge Instructions: Apply moisturizing lotion as directed Prim Dressing: KerraCel Ag Gelling Fiber Dressing, 4x5 in (silver alginate) (Home Health) 2 x Per Week/15 Days ary Discharge Instructions: Apply silver alginate to wound bed as instructed Secondary Dressing: Woven Gauze Sponge, Non-Sterile 4x4 in (Home Health) 2 x Per Week/15 Days Discharge Instructions: Apply over primary dressing as directed. Secondary Dressing: Zetuvit Plus 4x8 in (Home Health) 2 x Per Week/15 Days Discharge Instructions: Apply over primary dressing as directed. Secured With: Elastic Bandage 4 inch (ACE bandage) (Home Health) 2 x Per Week/15 Days Discharge Instructions: Secure with ACE bandage as directed. Secured With: [employer], 4.5x3.1 (in/yd) (Home Health) 2 x Per Week/15 Days Discharge Instructions: Secure with Kerlix as directed. Secured With: 53M Medipore H Soft Cloth Surgical T ape, 4 x 10 (in/yd) (Home Health) 2 x Per Week/15 Days Discharge Instructions: Secure with tape as directed. Secured With: Stretch Net Size 6, 10 (yds) (Home Health) 2 x Per Week/15 Days 1. Cephalexin 2. Silver alginate with absorbent pads under Ace wrap 3. Follow-up in 1 week 4. Go to the ED if symptoms worsen Electronic Signature(s) Signed: 05/31/2022 1:21:19 PM By: Bobby Shan DO Entered By: [name] on 05/31/2022 12:54:55 -------------------------------------------------------------------------------- HxROS Details Patient Name: Date of Service: [place] LA [name], [name]  05/31/2022 10:45 A M Medical Record Number: [id number] Patient Account Number: [id number] Date of Birth/Sex: Treating RN: December 01, 1975 (46 y.o. [name], [name] Primary Care Ramirez: [name] Other Clinician: Referring Ramirez: Treating Ramirez/Extender: [name] in Treatment: 0 Information Obtained From Patient Chart Constitutional Symptoms (General Health) Complaints and Symptoms: Negative for: Fatigue; Fever; Chills; Marked Weight Change Eyes Complaints and Symptoms: Negative for: Dry Eyes; Vision Changes; Glasses / Contacts Ear/Nose/Mouth/Throat Complaints and Symptoms: Negative for: Chronic sinus problems or rhinitis Respiratory Complaints and Symptoms: Negative for: Chronic or frequent coughs; Shortness of Breath Gastrointestinal Complaints and Symptoms: Negative for: Frequent diarrhea; Nausea; Vomiting Medical History: Past Medical History Notes: GERD OF HX Endocrine Complaints and Symptoms: Negative for: Heat/cold intolerance Genitourinary Complaints and Symptoms: Negative for: Frequent urination Integumentary (Skin) Complaints and Symptoms: Positive for: Wounds Musculoskeletal Complaints and Symptoms: Negative for: Muscle Pain; Muscle Weakness Psychiatric Complaints and  Symptoms: Negative for: Claustrophobia; Suicidal Hematologic/Lymphatic Cardiovascular Medical History: Positive for: Hypertension Immunological Neurologic Medical History: Positive for: Neuropathy Oncologic Immunizations Pneumococcal Vaccine: Received Pneumococcal Vaccination: No Implantable Devices None Hospitalization / Surgery History Type of Hospitalization/Surgery RIGHT LEG CELLULITIS X 2 BACK SURGERIES W/ TITAINIUM PLACEMENT X 2 HAND SURGERIES KNEE SURGERY Family and Social History Unknown History: Yes; Current every day smoker - 1/2 A PACK; Marital Status - Single; Alcohol Use: Never; Drug Use: No History; Caffeine Use: Moderate;  Financial Concerns: No; Food, Clothing or Shelter Needs: No; Support System Lacking: No; Transportation Concerns: No Electronic Signature(s) Signed: 05/31/2022 1:21:19 PM By: Bobby Shan DO Signed: 06/03/2022 4:44:20 PM By: Rhae Hammock RN Entered By: Rhae Hammock on 05/31/2022 11:03:44 -------------------------------------------------------------------------------- SuperBill Details Patient Name: Date of Service: [place] LA Lestine Box, [name] 05/31/2022 Medical Record Number: [id number] Patient Account Number: [id number] Date of Birth/Sex: Treating RN: [birth year] (46 y.o. [name], [name] Primary Care Ramirez: [name] Other Clinician: Referring Ramirez: Treating Ramirez/Extender: [name] in Treatment: 0 Diagnosis Coding ICD-10 Codes Code Description [phone] Non-pressure chronic ulcer of other part of right lower leg with fat layer exposed L03.115 Cellulitis of right lower limb I87.311 Chronic venous hypertension (idiopathic) with ulcer of right lower extremity M48.062 Spinal stenosis, lumbar region with neurogenic claudication Facility Procedures CPT4 Code: [id number] Description: 99214 - WOUND CARE VISIT-LEV 4 EST PT Modifier: Quantity: 1 Physician Procedures : CPT4 Code Description Modifier [id number] - WC PHYS LEVEL 4 - NEW PT ICD-10 Diagnosis Description [id number] Non-pressure chronic ulcer of other part of right lower leg with fat layer exposed L03.115 Cellulitis of right lower limb I87.311 Chronic  venous hypertension (idiopathic) with ulcer of right lower extremity M48.062 Spinal stenosis, lumbar region with neurogenic claudication Quantity: 1 Electronic Signature(s) Signed: 05/31/2022 1:21:19 PM By: Bobby Shan DO Entered By: [name] on 05/31/2022 12:55:09

## 2022-06-03 NOTE — Assessment & Plan Note (Signed)
Check prealbumin nutritional consult ordered °

## 2022-06-03 NOTE — Assessment & Plan Note (Signed)
-   Spoke about importance of quitting spent 5 minutes discussing options for treatment, prior attempts at quitting, and dangers of smoking ? -At this point patient is    interested in quitting ? - order nicotine patch  ? - nursing tobacco cessation protocol ? ?

## 2022-06-03 NOTE — Subjective & Objective (Signed)
Hx of reccurent cellulitis of RLE Have been of septra and keflex at home Had been admitted for the same Returns with worsening redness and swelling This episode started on 14 June he was seen in ER on 16th discharged home on doxycycline return 20th and had to be admitted for IV vancomycin at discharge and Bactrim seen by wound care started on Keflex in addition to Bactrim has been taking for the past 4 days still continues to have symptoms

## 2022-06-03 NOTE — Progress Notes (Signed)
Pharmacy Antibiotic Note  [name] is a 46 y.o. male admitted on 06/03/2022 with RLE cellulitis after completing courses of Keflex and Bactrim.  Pharmacy has been consulted for vancomycin dosing.  Plan: Vancomycin 2000 mg IV now, then 1500 mg IV q12 hr (est AUC 429 based on SCr 0.97; Vd 0.5) Measure vancomycin AUC at steady state as indicated SCr q48 while on vanc Rocephin per MD; will adjust dose to 2g daily for wt > 100 kg     Temp (24hrs), Avg:98.1 F (36.7 C), Min:98 F (36.7 C), Max:98.1 F (36.7 C)  Recent Labs  Lab 06/03/22 1722  WBC 7.4  CREATININE 0.97  LATICACIDVEN 1.6    CrCl cannot be calculated (Unknown ideal weight.).    Allergies  Allergen Reactions   Boudreauxs Baby Butt Smooth [Aquaphilic] Other (See Comments)    "Burned the skin"   Tramadol Nausea And Vomiting and Other (See Comments)    Can take in 2023; it just does not help the pain   Vicodin [Hydrocodone-Acetaminophen] Nausea And Vomiting and Other (See Comments)    Burns the stomach      Thank you for allowing pharmacy to be a part of this patient's care.  Derrik Mceachern A 06/03/2022 8:20 PM

## 2022-06-03 NOTE — Assessment & Plan Note (Signed)
-  admit per cellulitis protocol will      continue current antibiotic choice vancomycin Rocephin      plain films showed:  no evidence of air no evidence of osteomyelitis  no               foreign   objects     Will obtain MRSA screening,     obtain blood cultures if febrile or septic     further antibiotic adjustment pending above results

## 2022-06-03 NOTE — H&P (Signed)
[name] NID:[id number] DOB: 02/18/1976 DOA: 06/03/2022     PCP: Loura Back, NP      Patient arrived to ER on 06/03/22 at 1439 Referred by Attending Therisa Doyne, MD   Patient coming from:    home Lives With family    Chief Complaint:   Chief Complaint  Patient presents with   Wound Infection    HPI: [name] is a 46 y.o. male with medical history significant of hypertension chronic pain and cellulitis, GERD, hypoalbuminemia due to protein calorie malnutrition, obesity    Presented with   leg swelling and pain on the right Hx of reccurent cellulitis of RLE Have been of septra and keflex at home Had been admitted for the same Returns with worsening redness and swelling This episode started on 14 June he was seen in ER on 16th discharged home on doxycycline return 20th and had to be admitted for IV vancomycin at discharge and Bactrim seen by wound care started on Keflex in addition to Bactrim has been taking for the past 4 days still continues to have symptoms  Reports no fever no chills  No nausea and no vomiting  No ETOH  Smokes 1/2 pack a day No blood in stool no black stool He has been told before that he is anemia  Never had a colonoscopy Patient had tries to apply silverdine creame but it did not help   Regarding pertinent Chronic problems:       HTN on losartan  GERD on Protonix     Morbid obesity-   BMI Readings from Last 1 Encounters:  05/15/22 48.24 kg/m      Chronic anemia - baseline hg Hemoglobin & Hematocrit  Recent Labs    05/16/22 0625 05/17/22 0552 06/03/22 1722  HGB 12.6* 11.5* 10.5*     While in ER:   Dopplers negative for DVT Plain imaging showing diffuse soft tissue swelling no gas ID has been consulted recommending ceftriaxone being   Tibia fibia - . Diffuse soft tissue swelling. 2. No acute bony abnormality.   Following Medications were ordered in ER: Medications  morphine (PF) 4 MG/ML injection  4 mg (4 mg Intravenous Not Given 06/03/22 1859)  vancomycin (VANCOREADY) IVPB 2000 mg/400 mL (has no administration in time range)  cefTRIAXone (ROCEPHIN) 2 g in sodium chloride 0.9 % 100 mL IVPB (has no administration in time range)  fentaNYL (SUBLIMAZE) injection 50 mcg (has no administration in time range)  0.9 %  sodium chloride infusion (has no administration in time range)  cefTRIAXone (ROCEPHIN) 1 g in sodium chloride 0.9 % 100 mL IVPB (has no administration in time range)    _______________________________________________________ ER Provider Called:  ID    Dr. Thedore Mins  They Recommend admit to medicine   Will see in AM     ED Triage Vitals [06/03/22 1453]  Enc Vitals Group     BP 128/86     Pulse Rate (!) 122     Resp 18     Temp 98.1 F (36.7 C)     Temp Source Oral     SpO2 98 %     Weight      Height      Head Circumference      Peak Flow      Pain Score 9     Pain Loc      Pain Edu?      Excl. in GC?   RWER(15)@  _________________________________________ Significant initial  Findings: Abnormal Labs Reviewed  COMPREHENSIVE METABOLIC PANEL - Abnormal; Notable for the following components:      Result Value   Glucose, Bld 102 (*)    BUN 26 (*)    Calcium 8.8 (*)    Albumin 2.7 (*)    All other components within normal limits  CBC WITH DIFFERENTIAL/PLATELET - Abnormal; Notable for the following components:   RBC 3.55 (*)    Hemoglobin 10.5 (*)    HCT 32.3 (*)    All other components within normal limits     ECG: Ordered       The recent clinical data is shown below. Vitals:   06/03/22 1453 06/03/22 1645 06/03/22 1854  BP: 128/86 (!) 132/96 136/76  Pulse: (!) 122 98 86  Resp: Temp: 98.1 F (36.7 C)    TempSrc: Oral    SpO2: 98% 98% 96%     WBC     Component Value Date/Time   WBC 7.4 06/03/2022 1722   LYMPHSABS 2.2 06/03/2022 1722   MONOABS 0.7 06/03/2022 1722   EOSABS 0.4 06/03/2022 1722   BASOSABS 0.0 06/03/2022 1722        Lactic Acid, Venous    Component Value Date/Time   LATICACIDVEN 1.6 06/03/2022 1722     Procalcitonin     UA   ordered   Urine analysis:    Component Value Date/Time   COLORURINE YELLOW 08/02/2016 1430   APPEARANCEUR CLEAR 08/02/2016 1430   LABSPEC 1.027 08/02/2016 1430   PHURINE 5.5 08/02/2016 1430   GLUCOSEU NEGATIVE 08/02/2016 1430   HGBUR NEGATIVE 08/02/2016 1430   BILIRUBINUR NEGATIVE 08/02/2016 1430   KETONESUR NEGATIVE 08/02/2016 1430   PROTEINUR NEGATIVE 08/02/2016 1430   UROBILINOGEN 0.2 06/05/2011 1153   NITRITE NEGATIVE 08/02/2016 1430   LEUKOCYTESUR NEGATIVE 08/02/2016 1430    Results for orders placed or performed during the hospital encounter of 05/13/22  Blood culture (routine x 2)     Status: None   Collection Time: 05/13/22 11:28 AM   Specimen: BLOOD  Result Value Ref Range Status   Specimen Description BLOOD RIGHT ANTECUBITAL  Final   Special Requests   Final    BOTTLES DRAWN AEROBIC AND ANAEROBIC Blood Culture adequate volume   Culture   Final    NO GROWTH 5 DAYS Performed at [facility], [address] [place], [place] [address]    Report Status 05/18/2022 FINAL  Final  Blood culture (routine x 2)     Status: None   Collection Time: 05/13/22 11:29 AM   Specimen: BLOOD  Result Value Ref Range Status   Specimen Description BLOOD BLOOD LEFT HAND  Final   Special Requests   Final    BOTTLES DRAWN AEROBIC AND ANAEROBIC Blood Culture adequate volume   Culture   Final    NO GROWTH 5 DAYS Performed at [facility], [address], [place], [place] [address]    Report Status 05/18/2022 FINAL  Final     _______________________________________________ Hospitalist was called for admission for   Cellulitis of right lower extremity    The following Work up has been ordered so far:  Orders Placed This Encounter  Procedures   Critical Care   Culture, blood (routine x 2)   DG Tibia/Fibula Right   Lactic acid, plasma   Comprehensive metabolic  panel   CBC with Differential   Comprehensive metabolic panel   CBC   Diet Heart Room service appropriate? Yes; Fluid  consistency: Thin   Cardiac Monitoring Continuous x 24 hours Indications for use: Other; other indications for use: infection   Vital signs   Notify physician (specify)   Progressive Mobility Protocol: No Restrictions   If patient diabetic or glucose greater than 140 notify physician for Sliding Scale Insulin Orders   Oral care per nursing protocol   Initiate Oral Care Protocol   Initiate Carrier Fluid Protocol   Assess   Place order for blood cultures x 2 (from different sites) for Temp > 101F   RN may order General Admission PRN Orders utilizing "General Admission PRN medications" (through manage orders) for the following patient needs: allergy symptoms (Claritin), cold sores (Carmex), cough (Robitussin DM), eye irritation (Liquifilm Tears), hemorrhoids (Tucks), indigestion (Maalox), minor skin irritation (Hydrocortisone Cream), muscle pain Romeo Apple Gay), nose irritation (saline nasal spray) and sore throat (Chloraseptic spray).   SCDs   Full code   Consult to hospitalist   Consult to infectious diseases   Nutritional services consult   vancomycin per pharmacy consult   May transport without cardiac monitor   OT eval and treat   PT eval and treat   Pulse oximetry check with vital signs   Oxygen therapy Mode or (Route): Nasal cannula; Liters Per Minute: 2; Keep 02 saturation: greater than 92 %   Incentive spirometry   EKG 12-Lead   EKG 12-Lead   Admit to Inpatient (patient's expected length of stay will be greater than 2 midnights or inpatient only procedure)   VAS [place] LOWER EXTREMITY VENOUS (DVT) (7a-7p)     OTHER Significant initial  Findings:  labs showing:    Recent Labs  Lab 06/03/22 1722  NA 137  K 4.9  CO2 29  GLUCOSE 102*  BUN 26*  CREATININE 0.97  CALCIUM 8.8*    Cr  stable,    Lab Results  Component Value Date   CREATININE 0.97 06/03/2022    CREATININE 0.69 05/16/2022   CREATININE 0.96 05/15/2022    Recent Labs  Lab 06/03/22 1722  AST 24  ALT 19  ALKPHOS 98  BILITOT 0.3  PROT 8.1  ALBUMIN 2.7*   Lab Results  Component Value Date   CALCIUM 8.8 (L) 06/03/2022   PHOS 2.5 05/15/2022          Plt: Lab Results  Component Value Date   PLT 266 06/03/2022       Recent Labs  Lab 06/03/22 1722  WBC 7.4  NEUTROABS 4.1  HGB 10.5*  HCT 32.3*  MCV 91.0  PLT 266    HG/HCT  stable,      Component Value Date/Time   HGB 10.5 (L) 06/03/2022 1722   HCT 32.3 (L) 06/03/2022 1722   MCV 91.0 06/03/2022 1722      Cardiac Panel (last 3 results) Recent Labs    06/03/22 1722  CKTOTAL 54         Cultures:    Component Value Date/Time   SDES BLOOD BLOOD LEFT HAND 05/13/2022 1129   SPECREQUEST  05/13/2022 1129    BOTTLES DRAWN AEROBIC AND ANAEROBIC Blood Culture adequate volume   CULT  05/13/2022 1129    NO GROWTH 5 DAYS Performed at [facility], [address], [place], [place] [address]    REPTSTATUS 05/18/2022 FINAL 05/13/2022 1129     Radiological Exams on Admission: DG Tibia/Fibula Right  Result Date: 06/03/2022 CLINICAL DATA:  Right leg cellulitis. EXAM: RIGHT TIBIA AND FIBULA - 2 VIEW COMPARISON:  Right lower extremity CT 05/15/2022 FINDINGS:  No evidence for fracture or dislocation. No cortical erosion or periosteal reaction. There are mild degenerative changes of the knee. There is diffuse soft tissue swelling of the lower extremity. IMPRESSION: 1. Diffuse soft tissue swelling. 2. No acute bony abnormality. Electronically Signed   By: Darliss CheneyAmy  Guttmann M.D.   On: 06/03/2022 18:52   VAS US LOWER EXTREMITY VENOUS (DVT) (7a-7p)  Result Date: 06/03/2022  Lower Venous DVT Study Patient Name:  Dante GangCHARLES E Loving  Date of Exam:   06/03/2022 Medical Rec #: [id number]          Accession #:    [id number] Date of Birth: 11-13-1976          Patient Gender: M Patient Age:   [age] years Exam [facility]  Procedure:      VAS US LOWER EXTREMITY VENOUS (DVT) Referring Phys: Army MeliaLAURA MURPHY --------------------------------------------------------------------------------  Indications: Pain, Edema, and Erythema. Other Indications: Patient recently hospitalized for RLE cellulitis - continued                    pain and redness with increased swelling. Open wounds to RLE                    calf. Limitations: Body habitus, poor ultrasound/tissue interface and open wound. Comparison Study: Previous exam on 05/13/22 was negative for DVT. Performing Technologist: Ernestene MentionJody Hill RVT, RDMS  Examination Guidelines: A complete evaluation includes B-mode imaging, spectral Doppler, color Doppler, and power Doppler as needed of all accessible portions of each vessel. Bilateral testing is considered an integral part of a complete examination. Limited examinations for reoccurring indications may be performed as noted. The reflux portion of the exam is performed with the patient in reverse Trendelenburg.  +---------+---------------+---------+-----------+----------+--------------+ RIGHT    CompressibilityPhasicitySpontaneityPropertiesThrombus Aging +---------+---------------+---------+-----------+----------+--------------+ CFV      Full           Yes      Yes                                 +---------+---------------+---------+-----------+----------+--------------+ SFJ      Full                                                        +---------+---------------+---------+-----------+----------+--------------+ FV Prox  Full           Yes      Yes                                 +---------+---------------+---------+-----------+----------+--------------+ FV Mid                  Yes      Yes                                 +---------+---------------+---------+-----------+----------+--------------+ FV Distal               Yes      Yes                                  +---------+---------------+---------+-----------+----------+--------------+  PFV      Full                                                        +---------+---------------+---------+-----------+----------+--------------+ POP      Full           Yes      Yes                                 +---------+---------------+---------+-----------+----------+--------------+ PTV                                                   Not visualized +---------+---------------+---------+-----------+----------+--------------+ PERO                                                  Not visualized +---------+---------------+---------+-----------+----------+--------------+   Right Technical Findings: Calf vessels not visualized due to diffuse subcutaneous edema, open wounds/bandage to calf area, and patient discomfort. The distal and mid femoral vein are patent by color and doppler only.  +----+---------------+---------+-----------+----------+--------------+ LEFTCompressibilityPhasicitySpontaneityPropertiesThrombus Aging +----+---------------+---------+-----------+----------+--------------+ CFV Full           Yes      Yes                                 +----+---------------+---------+-----------+----------+--------------+    Summary: RIGHT: - There is no evidence of deep vein thrombosis in the lower extremity. However, portions of this examination were limited- see technologist comments above.  - Diffuse subcutaneous edema from area of popliteal calf to ankle. - Ultrasound characteristics of enlarged lymph nodes are noted in the groin.  LEFT: - No evidence of common femoral vein obstruction. - Ultrasound characteristics of enlarged lymph nodes noted in the groin.  *See table(s) above for measurements and observations.    Preliminary    _______________________________________________________________________________________________________ Latest  Blood pressure 136/76, pulse 86, temperature 98.1 F  (36.7 C), temperature source Oral, resp. rate 16, SpO2 96 %.   Vitals  labs and radiology finding personally reviewed  Review of Systems:    Pertinent positives include: right leg pain   Constitutional:  No weight loss, night sweats, Fevers, chills, fatigue, weight loss  HEENT:  No headaches, Difficulty swallowing,Tooth/dental problems,Sore throat,  No sneezing, itching, ear ache, nasal congestion, post nasal drip,  Cardio-vascular:  No chest pain, Orthopnea, PND, anasarca, dizziness, palpitations.no Bilateral lower extremity swelling  GI:  No heartburn, indigestion, abdominal pain, nausea, vomiting, diarrhea, change in bowel habits, loss of appetite, melena, blood in stool, hematemesis Resp:  no shortness of breath at rest. No dyspnea on exertion, No excess mucus, no productive cough, No non-productive cough, No coughing up of blood.No change in color of mucus.No wheezing. Skin:  no rash or lesions. No jaundice GU:  no dysuria, change in color of urine, no urgency or frequency. No straining to urinate.  No flank pain.  Musculoskeletal:  No joint pain or no joint swelling. No decreased range  of motion. No back pain.  Psych:  No change in mood or affect. No depression or anxiety. No memory loss.  Neuro: no localizing neurological complaints, no tingling, no weakness, no double vision, no gait abnormality, no slurred speech, no confusion  All systems reviewed and apart from HOPI all are negative _______________________________________________________________________________________________ Past Medical History:   Past Medical History:  Diagnosis Date   ADHD (attention deficit hyperactivity disorder)    Anxiety    Arthritis    knee and back   Chronic back pain    Depression    GERD (gastroesophageal reflux disease)    Hypertension    Neuropathy    Obesity    PONV (postoperative nausea and vomiting)      Past Surgical History:  Procedure Laterality Date   BACK SURGERY      CARPAL TUNNEL RELEASE Left    HAND SURGERY     KNEE SURGERY Right    TONSILLECTOMY      Social History:  Ambulatory   independently      reports that he has been smoking cigarettes. He has a 9.00 pack-year smoking history. He has never used smokeless tobacco. He reports that he does not drink alcohol and does not use drugs.    Family History:   Family History  Problem Relation Age of Onset   Heart failure Mother    Heart disease Mother    Diabetes Father    Stroke Brother    ______________________________________________________________________________________________ Allergies: Allergies  Allergen Reactions   Tramadol Nausea And Vomiting   Vicodin [Hydrocodone-Acetaminophen] Nausea And Vomiting     Prior to Admission medications   Medication Sig Start Date End Date Taking? Authorizing Provider  acetaminophen (TYLENOL) 325 MG tablet Take 2 tablets (650 mg total) by mouth every 6 (six) hours as needed for moderate pain. 05/17/22   Arrien, York Ram, MD  gabapentin (NEURONTIN) 800 MG tablet Take 1 tablet (800 mg total) by mouth 2 (two) times daily. Patient taking differently: Take 300 mg by mouth 3 (three) times daily. 11/24/17   Kerrin Champagne, MD  ibuprofen (ADVIL) 400 MG tablet Take 1 tablet (400 mg total) by mouth every 6 (six) hours as needed (leg pain). 05/17/22   Arrien, York Ram, MD  LINZESS 145 MCG CAPS capsule Take 1 capsule by mouth daily. 04/23/22   [provider]  losartan (COZAAR) 25 MG tablet Take 1 tablet (25 mg total) by mouth daily. 05/18/22 06/17/22  Arrien, York Ram, MD  pantoprazole (PROTONIX) 20 MG tablet Take 20 mg by mouth daily. 04/29/22   [provider]  promethazine (PHENERGAN) 25 MG tablet Take 25 mg by mouth every 8 (eight) hours as needed for nausea/vomiting. 01/25/22   [provider]    ___________________________________________________________________________________________________ Physical  Exam:    06/03/2022    6:54 PM 06/03/2022    4:45 PM 06/03/2022    2:53 PM  Vitals with BMI  Systolic 136 132 409  Diastolic 76 96 86  Pulse 86 98 122    1. General:  in No  Acute distress   Chronically ill  -appearing 2. Psychological: Alert and   Oriented 3. Head/ENT:    Dry Mucous Membranes                          Head Non traumatic, neck supple  Normal  Dentition 4. SKIN: normal   Skin turgor,  Skin clean Dry  red and macerated    5. Heart: Regular rate and rhythm no  Murmur, no Rub or gallop 6. Lungs: no wheezes or crackles  distant 7. Abdomen: Soft,  non-tender, Non distended   obese  bowel sounds present 8. Lower extremities: no clubbing, cyanosis, no  edema R >L  9. Neurologically Grossly intact, moving all 4 extremities equally  10. MSK: Normal range of motion    Chart has been reviewed  ______________________________________________________________________________________________  Assessment/Plan  46 y.o. male with medical history significant of hypertension chronic pain and cellulitis, GERD, hypoalbuminemia due to protein calorie malnutrition, obesity   Admitted for   Cellulitis of right lower extremity    Present on Admission:  Cellulitis  Cellulitis of right lower extremity  Essential hypertension, benign  GERD (gastroesophageal reflux disease)  Hypoalbuminemia due to protein-calorie malnutrition (HCC)  Class 3 obesity (HCC)  Anemia     Cellulitis of right lower extremity -admit per  cellulitis protocol will      continue current antibiotic choice vancomycin Rocephin      plain films showed:  no evidence of air no evidence of osteomyelitis  no               foreign   objects     Will obtain MRSA screening,     obtain blood cultures  if febrile or septic     further antibiotic adjustment pending above results   Essential hypertension, benign Allow permissive hypertension for tonight  GERD (gastroesophageal reflux  disease) Continue Protonix 40 mg daily  Hypoalbuminemia due to protein-calorie malnutrition (HCC) Check prealbumin nutritional consult ordered  Class 3 obesity (HCC) Will need to follow-up with nutrition as an outpatient  Anemia Order anemia panel    Other plan as per orders.  DVT prophylaxis:    SCDs Start: 06/03/22 1855    Code Status:    Code Status: Full Code FULL CODE  as per patient    I had personally discussed CODE STATUS with patient     Family Communication:   Family not at  Bedside  plan of care was discussed on the phone with *** Son, Daughter, Wife, Husband, Sister, Brother , father, mother  Disposition Plan:      To home once workup is complete and patient is stable   Following barriers for discharge:                                                           Anemia stable                             Pain controlled with PO medications                            able to transition to PO antibiotics                             Will need to be able to tolerate PO  Will need consultants to evaluate patient prior to discharge                       Would benefit from PT/OT eval prior to DC  Ordered                    Consults called:  ID is aware    Admission status:  ED Disposition     ED Disposition  Admit   Condition  --   Comment  Hospital Area: [facility] [[id number]]  Level of Care: Telemetry [5]  Admit to tele based on following criteria: Other see comments  Comments: infection  May admit patient to [facility] or [facility] if equivalent level of care is available:: No  Covid Evaluation: Asymptomatic - no recent exposure (last 10 days) testing not required  Diagnosis: Cellulitis [809983]  Admitting Physician: Therisa Doyne [[id number]]  Attending Physician: Therisa Doyne [[id number]]  Certification:: I certify this patient will need inpatient services for at least 2  midnights  Estimated Length of Stay: 2          inpatient     I Expect 2 midnight stay secondary to severity of patient's current illness need for inpatient interventions justified by the following:     Severe lab/radiological/exam abnormalities including:  cellulitis   and extensive comorbidities including: Morbid obesity  That are currently affecting medical management.   I expect  patient to be hospitalized for 2 midnights requiring inpatient medical care.  Patient is at high risk for adverse outcome (such as loss of life or disability) if not treated.  Indication for inpatient stay as follows:   severe pain requiring acute inpatient management,      Need for IV antibiotics, IV fluids,      Level of care     tele  For 12H       Leanna Hamid 06/03/2022, 10:11 PM    [facility]     after 2 AM please page floor coverage PA If 7AM-7PM, please contact the day team taking care of the patient using [email or web address]   Patient was evaluated in the context of the global COVID-19 pandemic, which necessitated consideration that the patient might be at risk for infection with the SARS-CoV-2 virus that causes COVID-19. Institutional protocols and algorithms that pertain to the evaluation of patients at risk for COVID-19 are in a state of rapid change based on information released by regulatory bodies including the CDC and federal and state organizations. These policies and algorithms were followed during the patient's care.

## 2022-06-03 NOTE — Progress Notes (Signed)
Patient is transferred to 5E from ED at 2028. Alert and oriented x 4. Right leg is swollen and tight. Dressing applied. Room is set up and vital signs was taken.

## 2022-06-03 NOTE — Assessment & Plan Note (Addendum)
Order anemia panel denies any bleeding no blood in stools.  If evidence of iron deficiency anemia will likely need GI follow-up

## 2022-06-03 NOTE — ED Triage Notes (Signed)
Pt arrived via POV, c/o cellulitis to RLE, was admitted for same recently. States swelling, pain has gotten worse. Was told by wound care center to come to ED

## 2022-06-03 NOTE — Assessment & Plan Note (Addendum)
Allow permissive hypertension for tonight If bp stable restrt Cozaar 25 mg po daily tomorrow

## 2022-06-03 NOTE — ED Provider Notes (Signed)
[facility]-EMERGENCY DEPT Provider Note   CSN: [id number] Arrival date & time: 06/03/22  1439     History  Chief Complaint  Patient presents with   Wound Infection    [name] is a 46 y.o. male.  HPI Patient is a morbidly obese 46 year old male with past medical history stemming for hypertension, reflux, anxiety, depression, neuropathy  Patient is presented emergency room today with complaints of worsening of his right lower extremity redness and swelling and cellulitis.  He states his symptoms initially started 6/14 and progressively worsened until he came to the emergency room 6/16 where he was offered admission for severe cellulitis however he declined admission and was discharged home on doxycycline however returned to the hospital 6/20 and was admitted and treated with IV vancomycin discharged on Bactrim which she has continued he was seen by wound care who started him on Keflex in addition to Bactrim.  He states that he was started on Keflex 4 days ago and is been taking as prescribed.   He states his symptoms of swelling, redness and pain have gotten worse.  He states the redness is spread up his leg some.     Home Medications Prior to Admission medications   Medication Sig Start Date End Date Taking? Authorizing Provider  acetaminophen (TYLENOL) 325 MG tablet Take 2 tablets (650 mg total) by mouth every 6 (six) hours as needed for moderate pain. 05/17/22   Arrien, York Ram, MD  gabapentin (NEURONTIN) 800 MG tablet Take 1 tablet (800 mg total) by mouth 2 (two) times daily. Patient taking differently: Take 300 mg by mouth 3 (three) times daily. 11/24/17   Kerrin Champagne, MD  ibuprofen (ADVIL) 400 MG tablet Take 1 tablet (400 mg total) by mouth every 6 (six) hours as needed (leg pain). 05/17/22   Arrien, York Ram, MD  LINZESS 145 MCG CAPS capsule Take 1 capsule by mouth daily. 04/23/22   [provider]  losartan (COZAAR) 25 MG  tablet Take 1 tablet (25 mg total) by mouth daily. 05/18/22 06/17/22  Arrien, York Ram, MD  pantoprazole (PROTONIX) 20 MG tablet Take 20 mg by mouth daily. 04/29/22   [provider]  promethazine (PHENERGAN) 25 MG tablet Take 25 mg by mouth every 8 (eight) hours as needed for nausea/vomiting. 01/25/22   [provider]      Allergies    Tramadol and Vicodin [hydrocodone-acetaminophen]    Review of Systems   Review of Systems  Physical Exam Updated Vital Signs BP (!) 132/96 (BP Location: Left Arm)   Pulse 98   Temp 98.1 F (36.7 C) (Oral)   Resp 18   SpO2 98%  Physical Exam Vitals and nursing note reviewed.  Constitutional:      Appearance: He is obese.     Comments: Chronically ill-appearing 46 year old male  HENT:     Head: Normocephalic and atraumatic.     Nose: Nose normal.     Mouth/Throat:     Mouth: Mucous membranes are moist.  Eyes:     General: No scleral icterus. Cardiovascular:     Rate and Rhythm: Regular rhythm. Tachycardia present.     Pulses: Normal pulses.     Heart sounds: Normal heart sounds.  Pulmonary:     Effort: Pulmonary effort is normal. No respiratory distress.     Breath sounds: No wheezing.  Abdominal:     Palpations: Abdomen is soft.     Tenderness: There is no abdominal tenderness.  Musculoskeletal:     Cervical back: Normal range of motion.     Right lower leg: Edema present.     Left lower leg: No edema.     Comments: Right lower extremity with redness swelling to the mid shin and some light redness that extends up into the right knee and medially up into the right thigh   Palpable right inguinal lymphadenopathy  Skin:    General: Skin is warm and dry.     Capillary Refill: Capillary refill takes less than 2 seconds.  Neurological:     Mental Status: He is alert. Mental status is at baseline.  Psychiatric:        Mood and Affect: Mood normal.        Behavior: Behavior normal.          ED Results /  Procedures / Treatments   Labs (all labs ordered are listed, but only abnormal results are displayed) Labs Reviewed  COMPREHENSIVE METABOLIC PANEL - Abnormal; Notable for the following components:      Result Value   Glucose, Bld 102 (*)    BUN 26 (*)    Calcium 8.8 (*)    Albumin 2.7 (*)    All other components within normal limits  CBC WITH DIFFERENTIAL/PLATELET - Abnormal; Notable for the following components:   RBC 3.55 (*)    Hemoglobin 10.5 (*)    HCT 32.3 (*)    All other components within normal limits  CULTURE, BLOOD (ROUTINE X 2)  CULTURE, BLOOD (ROUTINE X 2)  LACTIC ACID, PLASMA  LACTIC ACID, PLASMA    EKG None  Radiology VAS US LOWER EXTREMITY VENOUS (DVT) (7a-7p)  Result Date: 06/03/2022  Lower Venous DVT Study Patient Name:  Dante GangCHARLES E Chavarria  Date of Exam:   06/03/2022 Medical Rec #: [id number]          Accession #:    [id number] Date of Birth: [birth year]          Patient Gender: M Patient Age:   [age] years [facility] Procedure:      VAS US LOWER EXTREMITY VENOUS (DVT) Referring Phys: Vernona RiegerLAURA MURPHY --------------------------------------------------------------------------------  Indications: Pain, Edema, and Erythema. Other Indications: Patient recently hospitalized for RLE cellulitis - continued                    pain and redness with increased swelling. Open wounds to RLE                    calf. Limitations: Body habitus, poor ultrasound/tissue interface and open wound. Comparison Study: Previous exam on 05/13/22 was negative for DVT. Performing Technologist: Ernestene MentionJody Hill RVT, RDMS  Examination Guidelines: A complete evaluation includes B-mode imaging, spectral Doppler, color Doppler, and power Doppler as needed of all accessible portions of each vessel. Bilateral testing is considered an integral part of a complete examination. Limited examinations for reoccurring indications may be performed as noted. The reflux portion of the exam is performed with  the patient in reverse Trendelenburg.  +---------+---------------+---------+-----------+----------+--------------+ RIGHT    CompressibilityPhasicitySpontaneityPropertiesThrombus Aging +---------+---------------+---------+-----------+----------+--------------+ CFV      Full           Yes      Yes                                 +---------+---------------+---------+-----------+----------+--------------+ SFJ      Full                                                        +---------+---------------+---------+-----------+----------+--------------+  FV Prox  Full           Yes      Yes                                 +---------+---------------+---------+-----------+----------+--------------+ FV Mid                  Yes      Yes                                 +---------+---------------+---------+-----------+----------+--------------+ FV Distal               Yes      Yes                                 +---------+---------------+---------+-----------+----------+--------------+ PFV      Full                                                        +---------+---------------+---------+-----------+----------+--------------+ POP      Full           Yes      Yes                                 +---------+---------------+---------+-----------+----------+--------------+ PTV                                                   Not visualized +---------+---------------+---------+-----------+----------+--------------+ PERO                                                  Not visualized +---------+---------------+---------+-----------+----------+--------------+   Right Technical Findings: Calf vessels not visualized due to diffuse subcutaneous edema, open wounds/bandage to calf area, and patient discomfort. The distal and mid femoral vein are patent by color and doppler only.  +----+---------------+---------+-----------+----------+--------------+  LEFTCompressibilityPhasicitySpontaneityPropertiesThrombus Aging +----+---------------+---------+-----------+----------+--------------+ CFV Full           Yes      Yes                                 +----+---------------+---------+-----------+----------+--------------+    Summary: RIGHT: - There is no evidence of deep vein thrombosis in the lower extremity. However, portions of this examination were limited- see technologist comments above.  - Diffuse subcutaneous edema from area of popliteal calf to ankle. - Ultrasound characteristics of enlarged lymph nodes are noted in the groin.  LEFT: - No evidence of common femoral vein obstruction. - Ultrasound characteristics of enlarged lymph nodes noted in the groin.  *See table(s) above for measurements and observations.    Preliminary     Procedures .Critical Care  Performed by: Gailen Shelter, PA Authorized by: Gailen Shelter, PA   Critical care provider  statement:    Critical care time (minutes):  35   Critical care time was exclusive of:  Separately billable procedures and treating other patients and teaching time   Critical care was necessary to treat or prevent imminent or life-threatening deterioration of the following conditions:  Sepsis   Critical care was time spent personally by me on the following activities:  Development of treatment plan with patient or surrogate, review of old charts, re-evaluation of patient's condition, pulse oximetry, ordering and review of radiographic studies, ordering and review of laboratory studies, ordering and performing treatments and interventions, obtaining history from patient or surrogate, examination of patient and evaluation of patient's response to treatment   Care discussed with: admitting provider       Medications Ordered in ED Medications  morphine (PF) 4 MG/ML injection 4 mg (has no administration in time range)  vancomycin (VANCOREADY) IVPB 2000 mg/400 mL (has no administration in  time range)    ED Course/ Medical Decision Making/ A&P                           Medical Decision Making  This patient presents to the ED for concern of right leg pain, this involves a number of treatment options, and is a complaint that carries with it a moderate to high risk of complications and morbidity.  The differential diagnosis includes cellulitis, fracture, DVT, arterial injury   Co morbidities: Discussed in HPI   Brief History:  Patient is a morbidly obese 46 year old male with past medical history stemming for hypertension, reflux, anxiety, depression, neuropathy  Patient is presented emergency room today with complaints of worsening of his right lower extremity redness and swelling and cellulitis.  He states his symptoms initially started 6/14 and progressively worsened until he came to the emergency room 6/16 where he was offered admission for severe cellulitis however he declined admission and was discharged home on doxycycline however returned to the hospital 6/20 and was admitted and treated with IV vancomycin discharged on Bactrim which she has continued he was seen by wound care who started him on Keflex in addition to Bactrim.  He states that he was started on Keflex 4 days ago and is been taking as prescribed.   He states his symptoms of swelling, redness and pain have gotten worse.  He states the redness is spread up his leg some.   Hot red swollen right lower extremity.  Will obtain ultrasound of right lower extremity and treat with vancomycin    EMR reviewed including pt PMHx, past surgical history and past visits to ER.   See HPI for more details   Lab Tests:   I ordered and independently interpreted labs. Labs notable for kidney function at baseline.  Lactic within normal limits.  Blood cultures obtained and pending.  CBC without leukocytosis or anemia.   Imaging Studies:  NAD. I personally reviewed all imaging studies and no acute abnormality found.  I agree with radiology interpretation.  Right lower extremity ultrasound without DVT.    Cardiac Monitoring:  The patient was maintained on a cardiac monitor.  I personally viewed and interpreted the cardiac monitored which showed an underlying rhythm of: Sinus tachycardia NA   Medicines ordered:  I ordered medication including vancomycin, morphine for pain and infection Reevaluation of the patient after these medicines showed that the patient improved I have reviewed the patients home medicines and have made adjustments as needed   Critical Interventions:  Consults/Attending Physician   I discussed this case with my attending physician who cosigned this note including patient's presenting symptoms, physical exam, and planned diagnostics and interventions. Attending physician stated agreement with plan or made changes to plan which were implemented.   Reevaluation:  After the interventions noted above I re-evaluated patient and found that they have :stayed the same   Social Determinants of Health:      Problem List / ED Course:  Cellulitis on Keflex and Bactrim with worsening symptoms and severe cellulitis with significant edema in the right leg.  DVT ultrasound negative.  Will treat with vancomycin.  Dispostion:  After consideration of the diagnostic results and the patients response to treatment, I feel that the patent would benefit from admission  Final Clinical Impression(s) / ED Diagnoses Final diagnoses:  Cellulitis of right lower extremity    Rx / DC Orders ED Discharge Orders     None         Gailen Shelter, [place] 06/03/22 1818    Ernie Avena, MD 06/03/22 Terence Lux    Ernie Avena, MD 06/03/22 1930

## 2022-06-03 NOTE — Progress Notes (Signed)
A consult was received from an ED physician for vancomycin per pharmacy dosing (for an indication other than meningitis). The patient's profile has been reviewed for ht/wt/allergies/indication/available labs. A one time order has been placed for the above antibiotics.  Further antibiotics/pharmacy consults should be ordered by admitting physician if indicated.                       Bernadene Person, PharmD, BCPS [phone] 06/03/2022, 6:10 PM

## 2022-06-03 NOTE — Assessment & Plan Note (Signed)
Will need to follow-up with nutrition as an outpatient

## 2022-06-03 NOTE — Progress Notes (Signed)
[name] ([id number]) Visit Report for 05/31/2022 Abuse Risk Screen Details Patient Name: Date of Service: Bobby LA [name] Bobby E. 05/31/2022 10:45 A M Medical Record Number: [id number] Patient Account Number: [id number] Date of Birth/Sex: Treating RN: 08-05-1976 (46 y.o. [name], [name] Primary Care [name]: [name] Other Clinician: Referring [name]: Treating [name]/Extender: [name] in Treatment: 0 Abuse Risk Screen Items Answer ABUSE RISK SCREEN: Has anyone close to you tried to hurt or harm you recentlyo No Do you feel uncomfortable with anyone in your familyo No Has anyone forced you do things that you didnt want to doo No Electronic Signature(s) Signed: 06/03/2022 4:44:20 PM By: Bobby Mu RN Entered By: [name] on 05/31/2022 11:03:52 -------------------------------------------------------------------------------- Activities of Daily Living Details Patient Name: Date of Service: Bobby LA [name] Bobby E. 05/31/2022 10:45 A M Medical Record Number: [id number] Patient Account Number: [id number] Date of Birth/Sex: Treating RN: 1975-12-09 (45 y.o. [name], [name] Primary Care Markeria Goetsch: [name] Other Clinician: Referring Calem Cocozza: Treating Marquese Burkland/Extender: [name] in Treatment: 0 Activities of Daily Living Items Answer Activities of Daily Living (Please select one for each item) Drive Automobile Completely Able T Medications ake Completely Able Use T elephone Completely Able Care for Appearance Completely Able Use T oilet Completely Able Bath / Shower Completely Able Dress Self Completely Able Feed Self Completely Able Walk Completely Able Get In / Out Bed Completely Able Housework Completely Able Prepare Meals Completely Able Handle Money Completely Able Shop for Self Completely Able Electronic Signature(s) Signed: 06/03/2022 4:44:20 PM By: Bobby Mu RN Entered  By: [name] on 05/31/2022 11:04:20 -------------------------------------------------------------------------------- Education Screening Details Patient Name: Date of Service: Bobby LA [name], Bobby Bobby E. 05/31/2022 10:45 A M Medical Record Number: [id number] Patient Account Number: [id number] Date of Birth/Sex: Treating RN: 1976-07-06 (45 y.o. [name], [name] Primary Care Bronwyn Belasco: [name] Other Clinician: Referring Ilhan Debenedetto: Treating Ilda Laskin/Extender: [name] in Treatment: 0 Primary Learner Assessed: Patient Learning Preferences/Education Level/Primary Language Learning Preference: Explanation, Demonstration, Communication Board, Printed Material Highest Education Level: Grade School Preferred Language: English Cognitive Barrier Language Barrier: No Translator Needed: No Memory Deficit: No Emotional Barrier: No Cultural/Religious Beliefs Affecting Medical Care: No Physical Barrier Impaired Vision: No Impaired Hearing: No Decreased Hand dexterity: No Knowledge/Comprehension Knowledge Level: High Comprehension Level: High Ability to understand written instructions: High Ability to understand verbal instructions: High Motivation Anxiety Level: Calm Cooperation: Cooperative Education Importance: Denies Need Interest in Health Problems: Asks Questions Perception: Coherent Willingness to Engage in Self-Management High Activities: Readiness to Engage in Self-Management High Activities: Electronic Signature(s) Signed: 06/03/2022 4:44:20 PM By: Bobby Mu RN Entered By: [name] on 05/31/2022 11:04:46 -------------------------------------------------------------------------------- Fall Risk Assessment Details Patient Name: Date of Service: Bobby LA [name], Bobby Bobby E. 05/31/2022 10:45 A M Medical Record Number: [id number] Patient Account Number: [id number] Date of Birth/Sex: Treating RN: [birth year] (45 y.o. [name],  [name] Primary Care Chantz Montefusco: [name] Other Clinician: Referring Marty Uy: Treating Bud Kaeser/Extender: [name] in Treatment: 0 Fall Risk Assessment Items Have you had 2 or more falls in the last 12 monthso 0 No Have you had any fall that resulted in injury in the last 12 monthso 0 No FALLS RISK SCREEN History of falling - immediate or within 3 months 0 No Secondary diagnosis (Do you have 2 or more medical diagnoseso) 0 No Ambulatory aid None/bed rest/wheelchair/nurse 0 No Crutches/cane/walker 0 No Furniture 0 No Intravenous therapy  Access/Saline/Heparin Lock 0 No Gait/Transferring Normal/ bed rest/ wheelchair 0 No Weak (short steps with or without shuffle, stooped but able to lift head while walking, may seek 0 No support from furniture) Impaired (short steps with shuffle, may have difficulty arising from chair, head down, impaired 0 No balance) Mental Status Oriented to own ability 0 No Electronic Signature(s) Signed: 06/03/2022 4:44:20 PM By: Bobby Mu RN Entered By: [name] on 05/31/2022 11:05:19 -------------------------------------------------------------------------------- Foot Assessment Details Patient Name: Date of Service: Bobby LA [name], Bobby Bobby E. 05/31/2022 10:45 A M Medical Record Number: [id number] Patient Account Number: [id number] Date of Birth/Sex: Treating RN: November 04, 1976 (45 y.o. [name], [name] Primary Care Tyriek Hofman: [name] Other Clinician: Referring Landers Prajapati: Treating Agape Hardiman/Extender: [name] in Treatment: 0 Foot Assessment Items Site Locations + = Sensation present, - = Sensation absent, C = Callus, U = Ulcer R = Redness, W = Warmth, M = Maceration, PU = Pre-ulcerative lesion F = Fissure, S = Swelling, D = Dryness Assessment Right: Left: Other Deformity: No No Prior Foot Ulcer: No No Prior Amputation: No No Charcot Joint: No No Ambulatory Status: Ambulatory  Without Help Gait: Steady Electronic Signature(s) Signed: 06/03/2022 4:44:20 PM By: Bobby Mu RN Entered By: [name] on 05/31/2022 11:58:27 -------------------------------------------------------------------------------- Nutrition Risk Screening Details Patient Name: Date of Service: Bobby LA [name] Bobby E. 05/31/2022 10:45 A M Medical Record Number: [id number] Patient Account Number: [id number] Date of Birth/Sex: Treating RN: Dec 25, 1975 (45 y.o. [name] Primary Care Brittony Billick: [name] Other Clinician: Referring Glenetta Kiger: Treating Marleny Faller/Extender: Melody Comas Weeks in Treatment: 0 Height (in): Weight (lbs): Body Mass Index (BMI): Nutrition Risk Screening Items Score Screening NUTRITION RISK SCREEN: I have an illness or condition that made me change the kind and/or amount of food I eat 0 No I eat fewer than two meals per day 0 No I eat few fruits and vegetables, or milk products 0 No I have three or more drinks of beer, liquor or wine almost every day 0 No I have tooth or mouth problems that make it hard for me to eat 0 No I don't always have enough money to buy the food I need 0 No I eat alone most of the time 0 No I take three or more different prescribed or over-the-counter drugs a day 0 No Without wanting to, I have lost or gained 10 pounds in the last six months 0 No I am not always physically able to shop, cook and/or feed myself 0 No Nutrition Protocols Good Risk Protocol 0 No interventions needed Moderate Risk Protocol High Risk Proctocol Risk Level: Good Risk Score: 0 Electronic Signature(s) Signed: 06/03/2022 4:44:20 PM By: Bobby Mu RN Entered By: [name] on 05/31/2022 11:05:24

## 2022-06-04 ENCOUNTER — Telehealth (HOSPITAL_COMMUNITY): Payer: Self-pay | Admitting: Pharmacy Technician

## 2022-06-04 ENCOUNTER — Other Ambulatory Visit (HOSPITAL_COMMUNITY): Payer: Self-pay

## 2022-06-04 ENCOUNTER — Encounter (HOSPITAL_BASED_OUTPATIENT_CLINIC_OR_DEPARTMENT_OTHER): Payer: Medicare Other | Admitting: Internal Medicine

## 2022-06-04 DIAGNOSIS — I10 Essential (primary) hypertension: Secondary | ICD-10-CM | POA: Diagnosis not present

## 2022-06-04 DIAGNOSIS — E66813 Obesity, class 3: Secondary | ICD-10-CM | POA: Diagnosis present

## 2022-06-04 DIAGNOSIS — L03115 Cellulitis of right lower limb: Secondary | ICD-10-CM | POA: Diagnosis not present

## 2022-06-04 DIAGNOSIS — D649 Anemia, unspecified: Secondary | ICD-10-CM | POA: Diagnosis not present

## 2022-06-04 LAB — CBC
HCT: 32.7 % — ABNORMAL LOW (ref 39.0–52.0)
Hemoglobin: 10.3 g/dL — ABNORMAL LOW (ref 13.0–17.0)
MCH: 29.2 pg (ref 26.0–34.0)
MCHC: 31.5 g/dL (ref 30.0–36.0)
MCV: 92.6 fL (ref 80.0–100.0)
Platelets: 265 10*3/uL (ref 150–400)
RBC: 3.53 MIL/uL — ABNORMAL LOW (ref 4.22–5.81)
RDW: 13.5 % (ref 11.5–15.5)
WBC: 6.7 10*3/uL (ref 4.0–10.5)
nRBC: 0 % (ref 0.0–0.2)

## 2022-06-04 LAB — COMPREHENSIVE METABOLIC PANEL
ALT: 18 U/L (ref 0–44)
AST: 21 U/L (ref 15–41)
Albumin: 2.7 g/dL — ABNORMAL LOW (ref 3.5–5.0)
Alkaline Phosphatase: 94 U/L (ref 38–126)
Anion gap: 5 (ref 5–15)
BUN: 27 mg/dL — ABNORMAL HIGH (ref 6–20)
CO2: 30 mmol/L (ref 22–32)
Calcium: 8.6 mg/dL — ABNORMAL LOW (ref 8.9–10.3)
Chloride: 102 mmol/L (ref 98–111)
Creatinine, Ser: 0.87 mg/dL (ref 0.61–1.24)
GFR, Estimated: >60 mL/min (ref >60)
Glucose, Bld: 95 mg/dL (ref 70–99)
Potassium: 4.2 mmol/L (ref 3.5–5.1)
Sodium: 137 mmol/L (ref 135–145)
Total Bilirubin: 0.4 mg/dL (ref 0.3–1.2)
Total Protein: 8.1 g/dL (ref 6.5–8.1)

## 2022-06-04 LAB — URINALYSIS, COMPLETE (UACMP) WITH MICROSCOPIC
Bacteria, UA: NONE SEEN
Bilirubin Urine: NEGATIVE
Glucose, UA: NEGATIVE mg/dL
Hgb urine dipstick: NEGATIVE
Ketones, ur: NEGATIVE mg/dL
Leukocytes,Ua: NEGATIVE
Nitrite: NEGATIVE
Protein, ur: NEGATIVE mg/dL
Specific Gravity, Urine: 1.024 (ref 1.005–1.030)
pH: 5 (ref 5.0–8.0)

## 2022-06-04 LAB — C-REACTIVE PROTEIN: CRP: 7.4 mg/dL — ABNORMAL HIGH (ref <1.0)

## 2022-06-04 LAB — BRAIN NATRIURETIC PEPTIDE: B Natriuretic Peptide: 51.5 pg/mL (ref 0.0–100.0)

## 2022-06-04 LAB — PREALBUMIN: Prealbumin: 11 mg/dL — ABNORMAL LOW (ref 18–38)

## 2022-06-04 LAB — TROPONIN I (HIGH SENSITIVITY): Troponin I (High Sensitivity): 2 ng/L (ref <18)

## 2022-06-04 LAB — SEDIMENTATION RATE: Sed Rate: 81 mm/hr — ABNORMAL HIGH (ref 0–16)

## 2022-06-04 MED ORDER — ENOXAPARIN SODIUM 80 MG/0.8ML IJ SOSY: 80.0000 mg | PREFILLED_SYRINGE | INTRAMUSCULAR | Status: DC

## 2022-06-04 MED ORDER — LINACLOTIDE 145 MCG PO CAPS
145.0000 ug | ORAL_CAPSULE | ORAL | Status: DC
  Administered 2022-06-04: 145 ug via ORAL
  Filled 2022-06-04: qty 1

## 2022-06-04 MED ORDER — LOSARTAN POTASSIUM 50 MG PO TABS
25.0000 mg | ORAL_TABLET | Freq: Every day | ORAL | Status: DC
  Administered 2022-06-04: 25 mg via ORAL
  Filled 2022-06-04: qty 1

## 2022-06-04 MED ORDER — CHLORHEXIDINE GLUCONATE CLOTH 2 % EX PADS: 6.0000 | MEDICATED_PAD | Freq: Every day | CUTANEOUS | Status: DC

## 2022-06-04 MED ORDER — GABAPENTIN 300 MG PO CAPS
300.0000 mg | ORAL_CAPSULE | Freq: Three times a day (TID) | ORAL | Status: DC
  Administered 2022-06-04 (×2): 300 mg via ORAL
  Filled 2022-06-04 (×2): qty 1

## 2022-06-04 MED ORDER — MUPIROCIN 2 % EX OINT: 1.0000 | TOPICAL_OINTMENT | Freq: Two times a day (BID) | CUTANEOUS | Status: DC

## 2022-06-04 NOTE — Progress Notes (Deleted)
                                                  Against Medical Advice Patient at this time expresses desire to leave the Hospital immediately, patient has been warned that this is not Medically advisable at this time, and can result in Medical complications like Death and Disability, patient understands and accepts the risks involved and assumes full responsibilty of this decision.  This patient has also been advised that if they feel the need for further medical assistance to return to any available ER or dial 9-1-1.  Informed by Nursing staff that this patient has left care and has signed the form  Against Medical Advice on 06/04/2022 at 0225 Hrs.  Chinita Greenland BSN MSNA MSN ACNPC-AG Acute Care Nurse Practitioner [facility] [place]

## 2022-06-04 NOTE — Evaluation (Signed)
Occupational Therapy Evaluation Patient Details Name: [name] MRN: [id number] DOB: 03-21-1976 Today's Date: 06/04/2022   History of Present Illness patient is a 46 year old male who was admitted with cellulitis. PMH: L 4-5 stenosis, DDD, neuropathy, HTN, ADHD,   Clinical Impression   Patient evaluated by Occupational Therapy with no further acute OT needs identified. All education has been completed and the patient has no further questions. Patient is MI in ADLs. See below for any follow-up Occupational Therapy or equipment needs. OT is signing off. Thank you for this referral.       Recommendations for follow up therapy are one component of a multi-disciplinary discharge planning process, led by the attending physician.  Recommendations may be updated based on patient status, additional functional criteria and insurance authorization.   Follow Up Recommendations  No OT follow up    Assistance Recommended at Discharge None  Patient can return home with the following      Functional Status Assessment  Patient has not had a recent decline in their functional status  Equipment Recommendations  Other (comment) (RW)    Recommendations for Other Services       Precautions / Restrictions Precautions Precaution Comments: R leg  cellulitis Restrictions Weight Bearing Restrictions: No      Mobility Bed Mobility Overal bed mobility: Modified Independent                  Transfers Overall transfer level: Modified independent                        Balance Overall balance assessment: No apparent balance deficits (not formally assessed)                                         ADL either performed or assessed with clinical judgement   ADL Overall ADL's : Modified independent                                             Vision Patient Visual Report: No change from baseline Vision Assessment?: No apparent visual  deficits     Perception     Praxis      Pertinent Vitals/Pain Pain Assessment Pain Assessment: 0-10 Pain Score: 7  Pain Location: RLE Pain Descriptors / Indicators: Burning, Shooting Pain Intervention(s): Monitored during session, Repositioned     Hand Dominance     Extremity/Trunk Assessment Upper Extremity Assessment Upper Extremity Assessment: Overall WFL for tasks assessed   Lower Extremity Assessment Lower Extremity Assessment: Defer to PT evaluation   Cervical / Trunk Assessment Cervical / Trunk Assessment: Normal   Communication Communication Communication: No difficulties   Cognition Arousal/Alertness: Awake/alert Behavior During Therapy: WFL for tasks assessed/performed Overall Cognitive Status: Within Functional Limits for tasks assessed                                       General Comments       Exercises     Shoulder Instructions      Home Living Family/patient expects to be discharged to:: Private residence Living Arrangements: Other relatives (brother) Available Help at Discharge: Family;Available 24 hours/day Type of Home:  House Home Access: Level entry     Home Layout: One level     Bathroom Shower/Tub: Tub/shower unit             Additional Comments: plans to stay with mother and other sibling at time of d/c.      Prior Functioning/Environment Prior Level of Function : Independent/Modified Independent                        OT Problem List: Pain      OT Treatment/Interventions:      OT Goals(Current goals can be found in the care plan section) Acute Rehab OT Goals OT Goal Formulation: All assessment and education complete, DC therapy  OT Frequency:      Co-evaluation PT/OT/SLP Co-Evaluation/Treatment: Yes Reason for Co-Treatment: To address functional/ADL transfers PT goals addressed during session: Mobility/safety with mobility OT goals addressed during session: ADL's and self-care       AM-PAC OT "6 Clicks" Daily Activity     Outcome Measure Help from another person eating meals?: None Help from another person taking care of personal grooming?: None Help from another person toileting, which includes using toliet, bedpan, or urinal?: None Help from another person bathing (including washing, rinsing, drying)?: None Help from another person to put on and taking off regular upper body clothing?: None Help from another person to put on and taking off regular lower body clothing?: None 6 Click Score: 24   End of Session Equipment Utilized During Treatment: Rolling walker (2 wheels) Nurse Communication: Patient requests pain meds  Activity Tolerance: Patient tolerated treatment well Patient left: in chair;with call bell/phone within reach  OT Visit Diagnosis: Unsteadiness on feet (R26.81)                Time: 6644-0347 OT Time Calculation (min): 15 min Charges:  OT General Charges $OT Visit: 1 Visit OT Evaluation $OT Eval Low Complexity: 1 Low  Sharyn Blitz OTR/L, MS Acute Rehabilitation Department Office# [phone] Pager# [phone]   Ardyth Harps 06/04/2022, 10:46 AM

## 2022-06-04 NOTE — TOC Transition Note (Addendum)
Transition of Care [facility]) - CM/SW Discharge Note   Patient Details  Name: [name] MRN: [id number] Date of Birth: [birth year]  Transition of Care [facility]) CM/SW Contact:  Otelia Santee, LCSW Phone Number: 06/04/2022, 12:39 PM   Clinical Narrative:    Pt to have rolling walker delivered to room by [employer]. No PT/OT follow up recommended. Pt is [facility] with [facility] for wound care. No further TOC needs identified at this time.    Final next level of care: Home/Self Care Barriers to Discharge: Continued Medical Work up   Patient Goals and CMS Choice Patient states their goals for this hospitalization and ongoing recovery are:: To return home   Choice offered to / list presented to : Patient  Discharge Placement                       Discharge Plan and Services                DME Arranged: Walker rolling DME Agency: [employer] Date DME Agency Contacted: 06/04/22 Time DME Agency Contacted: 1237 Representative spoke with at DME Agency: Duwayne Heck            Social Determinants of Health (SDOH) Interventions     Readmission Risk Interventions    06/04/2022   12:35 PM 05/17/2022   11:25 AM  Readmission Risk Prevention Plan  Post Dischage Appt  Complete  Medication Screening  Complete  Transportation Screening Complete Complete  PCP or Specialist Appt within 5-7 Days Complete   Home Care Screening Complete   Medication Review (RN CM) Complete

## 2022-06-04 NOTE — Consult Note (Addendum)
[facility]                                                                                        Patient Identification: Patient Name: [name] MRN: [id number] Admit Date: 06/03/2022  3:14 PM Today's Date: 06/04/2022 Reason for consult: RT leg cellulitis  Requesting provider: Carolyne Littles   Active Problems:   Essential hypertension, benign   GERD (gastroesophageal reflux disease)   Cellulitis of right lower extremity   Hypoalbuminemia due to protein-calorie malnutrition (HCC)   Class 3 obesity (HCC)   Anemia   Tobacco abuse   Antibiotics:  Vancomycin 7/10- Ceftriaxone 7/10-  Lines/Hardware:  Assessment Rt leg swelling/redness with open wounds/weeping, concerns for cellulitis His main complaint is worsening swelling and pain in his rt leg. Also mentions improvement in redness in his upper thighs on IV abtx. He however is not febrile, no leukocytosis with no systemic symptoms. No issues in left leg.   Morbid Obesity/Possible OSA Smoking    Recommendations  Continue Vancomycin, pharmacy to dose DC ceftriaxone  WOC consult Keep leg elevated  If blood cx negative by tomorrow, plan to do PO linezolid/long acting for discharge  Check HCV ab given tattoos Following   Rest of the management as per the primary team. Please call with questions or concerns.  Thank you for the consult  Odette Fraction, MD Infectious Disease Physician [facility] for Infectious Disease [address] [place], [place] [address] Phone: [phone]  Fax: [phone]  __________________________________________________________________________________________________________ HPI and Hospital Course: 46 Y O male with PMH of Anxiety/ADHD/Depression, GERD, HTN, Obesity who came to the ED  on 7/10 worsening swelling and pain in the right leg.  He was advised to come to  the ED by wound care center. Swelling and redness initially started on 6/14 spontaneously and progressively worsened until he was seen in the ED on 6/16 where he was offered admission for severe cellulitis but he declined and was discharged home on doxycycline however return to the hospital 6/20 and was admitted and treated with IV vancomycin and discharged on Bactrim for 7 days which he completed and was refilled cephalexin on 7/7 by [facility] as symptoms was not improving.  Denies having fever, chills or sweats.  Denies having nausea, vomiting or diarrhea.  He had some improvement in redness in the right inner thigh after being on IV antibiotics. Denies prior episodes of cellulitis in rt leg/trauma/injury or insect bite. Poor historian currently due to frequently going back to sleep but arousable to voice. No issues in the left leg.  Smokes half pack of cigarettes a day, denies alcohol and IVDU.   At ED, afebrile, no leukocytosis Right leg x-ray with no acute abnormality  ROS: all systems reviewed with pertinent positives and negatives as listed above   Past Medical History:  Diagnosis Date   ADHD (attention deficit hyperactivity disorder)    Anxiety    Arthritis    knee and back   Chronic back pain    Depression    GERD (gastroesophageal reflux disease)    Hypertension    Neuropathy  Obesity    PONV (postoperative nausea and vomiting)    Past Surgical History:  Procedure Laterality Date   BACK SURGERY     CARPAL TUNNEL RELEASE Left    HAND SURGERY     KNEE SURGERY Right    TONSILLECTOMY      Scheduled Meds:  gabapentin  300 mg Oral TID   linaclotide  145 mcg Oral QODAY   losartan  25 mg Oral Daily   nicotine  14 mg Transdermal Daily   pantoprazole  40 mg Oral Q1200   Continuous Infusions:  cefTRIAXone (ROCEPHIN)  IV     vancomycin 1,500 mg (06/04/22 0515)   PRN Meds:.HYDROmorphone (DILAUDID) injection, oxyCODONE  Allergies  Allergen Reactions   Boudreauxs Baby Butt  Smooth [Aquaphilic] Other (See Comments)    "Burned the skin"   Tramadol Nausea And Vomiting and Other (See Comments)    Can take in 2023; it just does not help the pain   Vicodin [Hydrocodone-Acetaminophen] Nausea And Vomiting and Other (See Comments)    Burns the stomach   Social History   Socioeconomic History   Marital status: Single    Spouse name: Not on file   Number of children: Not on file   Years of education: Not on file   Highest education level: Not on file  Occupational History   Not on file  Tobacco Use   Smoking status: Every Day    Packs/day: 0.50    Years: 18.00    Total pack years: 9.00    Types: Cigarettes   Smokeless tobacco: Never  Vaping Use   Vaping Use: Never used  Substance and Sexual Activity   Alcohol use: No    Comment: quit 2009   Drug use: No   Sexual activity: Not on file  Other Topics Concern   Not on file  Social History Narrative   Not on file   Social Determinants of Health   Financial Resource Strain: Not on file  Food Insecurity: Not on file  Transportation Needs: Not on file  Physical Activity: Not on file  Stress: Not on file  Social Connections: Not on file  Intimate Partner Violence: Not on file     Family History  Problem Relation Age of Onset   Heart failure Mother    Heart disease Mother    Diabetes Father    Stroke Brother     Vitals BP (!) 141/86 (BP Location: Right Arm)   Pulse 94   Temp 98.2 F (36.8 C) (Oral)   Resp 17   Ht 5\' 11"  (1.803 m)   Wt (!) 156.9 kg   SpO2 96%   BMI 48.24 kg/m    Physical Exam Constitutional:  Morbidly obese     Comments: feeling very drowsy, arousable to voice   Cardiovascular:     Rate and Rhythm: Normal rate and regular rhythm.     Heart sounds:   Pulmonary:     Effort: Pulmonary effort is normal.     Comments: Normal breath sounds   Abdominal:     Palpations: Abdomen is soft.     Tenderness: non distended and non tender   Musculoskeletal:         General:        Skin:    Comments: tattoos in b/l UE  Neurological:     General: grossly non focal  Psychiatric:        Mood and Affect: Mood normal.    Pertinent Microbiology Results for orders  placed or performed during the hospital encounter of 06/03/22  Culture, blood (routine x 2)     Status: None (Preliminary result)   Collection Time: 06/03/22  5:15 PM   Specimen: BLOOD  Result Value Ref Range Status   Specimen Description   Final    BLOOD SITE NOT SPECIFIED Performed at [facility], [address] [place], [place] [address]    Special Requests   Final    BOTTLES DRAWN AEROBIC AND ANAEROBIC Blood Culture adequate volume Performed at [facility], [address] [place], [place] [address]    Culture   Final    NO GROWTH < 12 HOURS Performed at [facility], [address] [place], [place] [address]    Report Status PENDING  Incomplete  Culture, blood (routine x 2)     Status: None (Preliminary result)   Collection Time: 06/03/22  5:22 PM   Specimen: BLOOD  Result Value Ref Range Status   Specimen Description   Final    BLOOD SITE NOT SPECIFIED Performed at [facility], [address] [place], [place] [address]    Special Requests   Final    BOTTLES DRAWN AEROBIC AND ANAEROBIC Blood Culture adequate volume Performed at [facility], [address] [place], [place] [address]    Culture   Final    NO GROWTH < 12 HOURS Performed at [facility], [address] [place], [place] [address]    Report Status PENDING  Incomplete     Pertinent Lab seen by me:    Latest Ref Rng & Units 06/04/2022    5:50 AM 06/03/2022    5:22 PM 05/17/2022    5:52 AM  CBC  WBC 4.0 - 10.5 K/uL 6.7  7.4  11.3   Hemoglobin 13.0 - 17.0 g/dL 57.810.3  46.910.5  62.911.5   Hematocrit 39.0 - 52.0 % 32.7  32.3  34.0   Platelets 150 - 400 K/uL 265  266  395       Latest Ref Rng & Units 06/04/2022     5:50 AM 06/03/2022    5:22 PM 05/16/2022    6:25 AM  CMP  Glucose 70 - 99 mg/dL 95  528102  413105   BUN 6 - 20 mg/dL 27  26  16    Creatinine 0.61 - 1.24 mg/dL 2.440.87  0.100.97  2.720.69   Sodium 135 - 145 mmol/L 137  137  134   Potassium 3.5 - 5.1 mmol/L 4.2  4.9  4.1   Chloride 98 - 111 mmol/L 102  102  100   CO2 22 - 32 mmol/L 30  29  28    Calcium 8.9 - 10.3 mg/dL 8.6  8.8  8.2   Total Protein 6.5 - 8.1 g/dL 8.1  8.1  7.6   Total Bilirubin 0.3 - 1.2 mg/dL 0.4  0.3  0.8   Alkaline Phos 38 - 126 U/L 94  98  97   AST 15 - 41 U/L 21  24  27    ALT 0 - 44 U/L 18  19  25       Pertinent Imagings/Other Imagings Plain films and CT images have been personally visualized and interpreted; radiology reports have been reviewed. Decision making incorporated into the Impression / Recommendations.  DG Tibia/Fibula Right  Result Date: 06/03/2022 CLINICAL DATA:  Right leg cellulitis. EXAM: RIGHT TIBIA AND FIBULA - 2 VIEW COMPARISON:  Right lower extremity CT 05/15/2022  FINDINGS: No evidence for fracture or dislocation. No cortical erosion or periosteal reaction. There are mild degenerative changes of the knee. There is diffuse soft tissue swelling of the lower extremity. IMPRESSION: 1. Diffuse soft tissue swelling. 2. No acute bony abnormality. Electronically Signed   By: Darliss Cheney M.D.   On: 06/03/2022 18:52   VAS [place] LOWER EXTREMITY VENOUS (DVT) (7a-7p)  Result Date: 06/03/2022  Lower Venous DVT Study Patient Name:  [name]  Date of Exam:   06/03/2022 Medical Rec #: [id number]          Accession #:    [id number] Date of Birth: [birth year]          Patient Gender: M Patient Age:   20 years Exam Location:  [facility] Procedure:      VAS [place] LOWER EXTREMITY VENOUS (DVT) Referring Phys: Army Melia --------------------------------------------------------------------------------  Indications: Pain, Edema, and Erythema. Other Indications: Patient recently hospitalized for RLE cellulitis - continued                     pain and redness with increased swelling. Open wounds to RLE                    calf. Limitations: Body habitus, poor ultrasound/tissue interface and open wound. Comparison Study: Previous exam on 05/13/22 was negative for DVT. Performing Technologist: Ernestene Mention RVT, RDMS  Examination Guidelines: A complete evaluation includes B-mode imaging, spectral Doppler, color Doppler, and power Doppler as needed of all accessible portions of each vessel. Bilateral testing is considered an integral part of a complete examination. Limited examinations for reoccurring indications may be performed as noted. The reflux portion of the exam is performed with the patient in reverse Trendelenburg.  +---------+---------------+---------+-----------+----------+--------------+ RIGHT    CompressibilityPhasicitySpontaneityPropertiesThrombus Aging +---------+---------------+---------+-----------+----------+--------------+ CFV      Full           Yes      Yes                                 +---------+---------------+---------+-----------+----------+--------------+ SFJ      Full                                                        +---------+---------------+---------+-----------+----------+--------------+ FV Prox  Full           Yes      Yes                                 +---------+---------------+---------+-----------+----------+--------------+ FV Mid                  Yes      Yes                                 +---------+---------------+---------+-----------+----------+--------------+ FV Distal               Yes      Yes                                 +---------+---------------+---------+-----------+----------+--------------+  PFV      Full                                                        +---------+---------------+---------+-----------+----------+--------------+ POP      Full           Yes      Yes                                  +---------+---------------+---------+-----------+----------+--------------+ PTV                                                   Not visualized +---------+---------------+---------+-----------+----------+--------------+ PERO                                                  Not visualized +---------+---------------+---------+-----------+----------+--------------+   Right Technical Findings: Calf vessels not visualized due to diffuse subcutaneous edema, open wounds/bandage to calf area, and patient discomfort. The distal and mid femoral vein are patent by color and doppler only.  +----+---------------+---------+-----------+----------+--------------+ LEFTCompressibilityPhasicitySpontaneityPropertiesThrombus Aging +----+---------------+---------+-----------+----------+--------------+ CFV Full           Yes      Yes                                 +----+---------------+---------+-----------+----------+--------------+    Summary: RIGHT: - There is no evidence of deep vein thrombosis in the lower extremity. However, portions of this examination were limited- see technologist comments above.  - Diffuse subcutaneous edema from area of popliteal calf to ankle. - Ultrasound characteristics of enlarged lymph nodes are noted in the groin.  LEFT: - No evidence of common femoral vein obstruction. - Ultrasound characteristics of enlarged lymph nodes noted in the groin.  *See table(s) above for measurements and observations.    Preliminary    CT EXTREMITY LOWER RIGHT W CONTRAST  Result Date: 05/15/2022 CLINICAL DATA:  Right lower extremity cellulitis with drainage. EXAM: CT OF THE LOWER RIGHT EXTREMITY WITH CONTRAST TECHNIQUE: Multidetector CT imaging of the lower right extremity was performed according to the standard protocol following intravenous contrast administration. RADIATION DOSE REDUCTION: This exam was performed according to the departmental dose-optimization program which includes  automated exposure control, adjustment of the mA and/or kV according to patient size and/or use of iterative reconstruction technique. CONTRAST:  OMNIPAQUE IOHEXOL 300 MG/ML  SOLN COMPARISON:  05/13/2022. FINDINGS: Bones/Joint/Cartilage No acute fracture or dislocation. Degenerative changes are noted at the ankle, foot, and knee. A trace joint effusion is noted at the knee. A fluid collection is noted in the popliteal fossa containing a few bony densities suggesting Baker's cyst with likely loose bodies. Ligaments Suboptimally assessed by CT. Muscles and Tendons No significant intramuscular edema. Soft tissues Diffuse subcutaneous fat stranding and edema is noted in the right lower extremity and over the dorsum of the foot. No abscess or abnormal enhancement is identified. IMPRESSION: 1. Extensive subcutaneous fat stranding and edema  in the right lower extremity compatible with cellulitis. No evidence of abscess or osteomyelitis. 2. Degenerative changes at the knee with trace joint effusion. 3. Baker's cyst in the popliteal fossa containing loose bodies. Electronically Signed   By: Thornell Sartorius M.D.   On: 05/15/2022 03:52   US Venous Img Lower Right (DVT Study)  Result Date: 05/13/2022 CLINICAL DATA:  Right lower extremity swelling. EXAM: Right LOWER EXTREMITY VENOUS DOPPLER ULTRASOUND TECHNIQUE: Gray-scale sonography with graded compression, as well as color Doppler and duplex ultrasound were performed to evaluate the lower extremity deep venous systems from the level of the common femoral vein and including the common femoral, femoral, profunda femoral, popliteal and calf veins including the posterior tibial, peroneal and gastrocnemius veins when visible. The superficial great saphenous vein was also interrogated. Spectral Doppler was utilized to evaluate flow at rest and with distal augmentation maneuvers in the common femoral, femoral and popliteal veins. COMPARISON:  None Available. FINDINGS:  Contralateral Common Femoral Vein: Respiratory phasicity is normal and symmetric with the symptomatic side. No evidence of thrombus. Normal compressibility. Common Femoral Vein: No evidence of thrombus. Normal compressibility, respiratory phasicity and response to augmentation. Saphenofemoral Junction: No evidence of thrombus. Normal compressibility and flow on color Doppler imaging. Profunda Femoral Vein: No evidence of thrombus. Normal compressibility and flow on color Doppler imaging. Femoral Vein: No evidence of thrombus. Normal compressibility, respiratory phasicity and response to augmentation. Popliteal Vein: No evidence of thrombus. Normal compressibility, respiratory phasicity and response to augmentation. Calf Veins: Peroneal and posterior tibial veins are not well visualized. Superficial Great Saphenous Vein: No evidence of thrombus. Normal compressibility. Venous Reflux:  None. Other Findings: Probable Baker's cyst seen in right popliteal fossa. Multiple enlarged right inguinal lymph nodes, the largest measuring 1.9 cm in minor axis. IMPRESSION: No evidence of deep venous thrombosis in visualized veins of right lower extremity, although the calf veins are not well visualized. Significantly enlarged right inguinal adenopathy is noted, with largest lymph node measuring 1.9 cm in minor axis. Tissue sampling is recommended. Electronically Signed   By: Lupita Raider M.D.   On: 05/13/2022 12:10     I spent more than 80 minutes for this patient encounter including review of prior medical records/discussing diagnostics and treatment plan with the patient/family/coordinate care with primary/other specialits with greater than 50% of time in face to face encounter.   Electronically signed by:   Odette Fraction, MD Infectious Disease Physician [facility] for Infectious Disease Pager: [phone]

## 2022-06-04 NOTE — Telephone Encounter (Signed)
Pharmacy Patient Advocate Encounter  Insurance verification completed.    The patient is insured through [employer] Medicare Part D   The patient is currently admitted and ran test claims for the following: linezolid (Zyvox) 600 mg tablets.  Copays and coinsurance results were relayed to Inpatient clinical team.  

## 2022-06-04 NOTE — Progress Notes (Signed)
Initial Nutrition Assessment  DOCUMENTATION CODES:   Obesity unspecified  INTERVENTION:   -Multivitamin with minerals daily  NUTRITION DIAGNOSIS:   Increased nutrient needs related to wound healing as evidenced by estimated needs.   GOAL:   Patient will meet greater than or equal to 90% of their needs  MONITOR:   PO intake, Weight trends, Labs, I & O's, Skin  REASON FOR ASSESSMENT:   Consult Assessment of nutrition requirement/status  ASSESSMENT:   46 y.o. male with medical history significant of hypertension chronic pain and cellulitis, GERD, hypoalbuminemia due to protein calorie malnutrition, obesity.  Presented with   leg swelling and pain on the right.  Patient in room, sitting beside window. Pt provides vague answers to RD's questions. Reports good appetite, no issues with eating. States he eats protein foods. Not interested in interventions at this time. Will add daily MVI. PO intakes: 100%  Per weight records, no weight loss noted. Pt denies weight changes as well.  Per nursing documentation, pt with severe RLE edema.  Medications reviewed.  Labs reviewed: Low iron (38)   NUTRITION - FOCUSED PHYSICAL EXAM:  No depletions noted.  Diet Order:   Diet Order             Diet Heart Room service appropriate? Yes; Fluid consistency: Thin  Diet effective now                   EDUCATION NEEDS:   No education needs have been identified at this time  Skin:  Skin Assessment: Reviewed RN Assessment  Last BM:  7/10  Height:   Ht Readings from Last 1 Encounters:  06/03/22 5\' 11"  (1.803 m)    Weight:   Wt Readings from Last 1 Encounters:  06/03/22 (!) 156.9 kg    BMI:  Body mass index is 48.24 kg/m.  Estimated Nutritional Needs:   Kcal:  2000-2200  Protein:  110-120g  Fluid:  2L/day  08/04/22, MS, RD, LDN Inpatient Clinical Dietitian Contact information available via Amion

## 2022-06-04 NOTE — Progress Notes (Signed)
This RN went to administer pain medication to patient.  Pt noted to be nodding off and swaying while this RN was scanning medication.  This RN told patient that she is not comfortable administered narcotics to him due to this.  Pt confrontation and states he is leaving AMA>  This RN got AMA paper signed.  Chinita Greenland, NP notified, Jerilynn Som, Charge RN notified.  Pt on phone with brother and notifyin him of leaving.  Security called to escort patient off premises.  IV removed.

## 2022-06-04 NOTE — Progress Notes (Signed)
PROGRESS NOTE    [name]  VZD:[id number] DOB: 09-Aug-1976 DOA: 06/03/2022 PCP: Loura Back, NP     Brief Narrative:  [name] is a 46 y.o. WM PMHx essential HTN,Chronic pain and cellulitis, GERD, hypoalbuminemia due to protein calorie malnutrition, obesity    Presented with   leg swelling and pain on the right Hx of reccurent cellulitis of RLE Have been of septra and keflex at home Had been admitted for the same Returns with worsening redness and swelling This episode started on 14 June he was seen in ER on 16th discharged home on doxycycline return 20th and had to be admitted for IV vancomycin at discharge and Bactrim seen by wound care started on Keflex in addition to Bactrim has been taking for the past 4 days still continues to have symptoms   Subjective: Lethargic but arousable A/O x4.  Bilateral pedal edema to hips.  States was placed on water pill but he does not know why (i.e. does not know if he has CHF)   Assessment & Plan: Covid vaccination;   Active Problems:   Cellulitis of right lower extremity   Essential hypertension, benign   GERD (gastroesophageal reflux disease)   Hypoalbuminemia due to protein-calorie malnutrition (HCC)   Class 3 obesity (HCC)   Anemia   Tobacco abuse     Cellulitis of right lower extremity -Continue current antibiotic.  May require extended course given the extent of infection.  See pictures below.     -7/11 MRSA nasal swab pending - 7/11 cultures NGTD     Essential hypertension, benign Allow permissive hypertension for tonight If bp stable restrt Cozaar 25 mg po daily tomorrow  CHF? - Very lethargic, anasarca -7/10 EKG no significant change when compared to previous - 7/11 echocardiogram pending - 7/11 trend troponin -   GERD (gastroesophageal reflux disease) -Continue Protonix 40 mg daily   Hypoalbuminemia due to protein-calorie malnutrition (HCC) -Check prealbumin nutritional consult ordered      Normocytic Anemia -Anemia panel consistent with normocytic anemia.     Tobacco abuse  - Spoke about importance of quitting spent 5 minutes discussing options for treatment, prior attempts at quitting, and dangers of smoking -Nicotine patch   Obesity class II BMI 48.24 kg/m. -Will need to follow-up with nutrition as an outpatient       Mobility Assessment (last 72 hours)     Mobility Assessment     Row Name 06/03/22 2100           Does patient have an order for bedrest or is patient medically unstable No - Continue assessment       What is the highest level of mobility based on the progressive mobility assessment? Level 5 (Walks with assist in room/hall) - Balance while stepping forward/back and can walk in room with assist - Complete                  Interdisciplinary Goals of Care Family Meeting   Date carried out: 06/04/2022  Location of the meeting:   Member's involved:   Durable Power of Insurance risk surveyor:     Discussion: We discussed goals of care for [employer] .    Code status:   Disposition:   Time spent for the meeting:     [name] J, MD  06/04/2022, 8:11 AM         DVT prophylaxis: Lovenox Code Status: Full Family Communication:  Status is: Inpatient  Dispo: The patient is from: Home              Anticipated d/c is to: Home              Anticipated d/c date is: > 3 days              Patient currently is not medically stable to d/c.      Consultants:    Procedures/Significant Events:    I have personally reviewed and interpreted all radiology studies and my findings are as above.  VENTILATOR SETTINGS:    Cultures 7/10 blood NGTD x2 7/11 MRSA nasal swab pending   Antimicrobials: Anti-infectives (From admission, onward)    Start     Ordered Stop   06/04/22 1900  cefTRIAXone (ROCEPHIN) 1 g in sodium chloride 0.9 % 100 mL IVPB  Status:  Discontinued        06/03/22 1900  06/03/22 2004   06/04/22 1900  cefTRIAXone (ROCEPHIN) 2 g in sodium chloride 0.9 % 100 mL IVPB        06/03/22 2004     06/04/22 0600  vancomycin (VANCOREADY) IVPB 1500 mg/300 mL        06/03/22 2004     06/03/22 1900  cefTRIAXone (ROCEPHIN) 2 g in sodium chloride 0.9 % 100 mL IVPB  Status:  Discontinued        06/03/22 1848 06/03/22 1857   06/03/22 1900  ceFEPIme (MAXIPIME) 2 g in sodium chloride 0.9 % 100 mL IVPB  Status:  Discontinued        06/03/22 1857 06/03/22 1858   06/03/22 1900  cefTRIAXone (ROCEPHIN) 2 g in sodium chloride 0.9 % 100 mL IVPB        06/03/22 1858 06/03/22 2005   06/03/22 1815  vancomycin (VANCOREADY) IVPB 2000 mg/400 mL        06/03/22 1809 06/03/22 2200         Devices    LINES / TUBES:      Continuous Infusions:  cefTRIAXone (ROCEPHIN)  IV     vancomycin 1,500 mg (06/04/22 0515)     Objective: Vitals:   06/03/22 2030 06/03/22 2100 06/03/22 2359 06/04/22 0624  BP: 137/82  (!) 148/91 (!) 177/89  Pulse: 85  (!) 104 86  Resp: 17  16 18   Temp: 98.4 F (36.9 C)  98.6 F (37 C) 98 F (36.7 C)  TempSrc:    Oral  SpO2: 98%  95% 97%  Weight:  (!) 156.9 kg    Height:  5\' 11"  (1.803 m)      Intake/Output Summary (Last 24 hours) at 06/04/2022 [id number] Last data filed at 06/04/2022 0600 Gross per 24 hour  Intake 320.43 ml  Output 250 ml  Net 70.43 ml   Filed Weights   06/03/22 2100  Weight: (!) 156.9 kg    Examination:  General: A/O x4, No acute respiratory distress Eyes: negative scleral hemorrhage, negative anisocoria, negative icterus ENT: Negative Runny nose, negative gingival bleeding, Neck:  Negative scars, masses, torticollis, lymphadenopathy, JVD Lungs: Clear to auscultation bilaterally without wheezes or crackles Cardiovascular: Regular rate and rhythm without murmur gallop or rub normal S1 and S2 Abdomen: negative abdominal pain, nondistended, positive soft, bowel sounds, no rebound, no ascites, no appreciable mass Extremities:   Skin clean Dry  red and macerated    Skin: See pictures above Psychiatric:  Negative depression, negative anxiety, negative fatigue, negative mania  Central nervous system:  Cranial nerves II through XII intact, tongue/uvula  midline, all extremities muscle strength 5/5, sensation intact throughout, negative dysarthria, negative expressive aphasia, negative receptive aphasia.  .     Data Reviewed: Care during the described time interval was provided by me .  I have reviewed this patient's available data, including medical history, events of note, physical examination, and all test results as part of my evaluation.  CBC: Recent Labs  Lab 06/03/22 1722 06/04/22 0550  WBC 7.4 6.7  NEUTROABS 4.1  --   HGB 10.5* 10.3*  HCT 32.3* 32.7*  MCV 91.0 92.6  PLT 266 [id number]   Basic Metabolic Panel: Recent Labs  Lab 06/03/22 1722 06/04/22 0550  NA 137 137  K 4.9 4.2  CL 102 102  CO2 29 30  GLUCOSE 102* 95  BUN 26* 27*  CREATININE 0.97 0.87  CALCIUM 8.8* 8.6*  MG 1.8  --   PHOS 3.8  --    GFR: Estimated Creatinine Clearance: 163.6 mL/min (by C-G formula based on SCr of 0.87 mg/dL). Liver Function Tests: Recent Labs  Lab 06/03/22 1722 06/04/22 0550  AST 24 21  ALT 19 18  ALKPHOS 98 94  BILITOT 0.3 0.4  PROT 8.1 8.1  ALBUMIN 2.7* 2.7*   No results for input(s): "LIPASE", "AMYLASE" in the last 168 hours. No results for input(s): "AMMONIA" in the last 168 hours. Coagulation Profile: No results for input(s): "INR", "PROTIME" in the last 168 hours. Cardiac Enzymes: Recent Labs  Lab 06/03/22 1722  CKTOTAL 54   BNP (last 3 results) No results for input(s): "PROBNP" in the last 8760 hours. HbA1C: No results for input(s): "HGBA1C" in the last 72 hours. CBG: No results for input(s): "GLUCAP" in the last 168 hours. Lipid Profile: No results for input(s): "CHOL", "HDL", "LDLCALC", "TRIG", "CHOLHDL", "LDLDIRECT" in the last 72 hours. Thyroid Function Tests: Recent Labs     06/03/22 2113  TSH 2.898   Anemia Panel: Recent Labs    06/03/22 2113  VITAMINB12 354  FOLATE 10.0  FERRITIN 78  TIBC 328  IRON 38*  RETICCTPCT 2.3   Sepsis Labs: Recent Labs  Lab 06/03/22 1722 06/03/22 2113  LATICACIDVEN 1.6 1.3    Recent Results (from the past 240 hour(s))  Culture, blood (routine x 2)     Status: None (Preliminary result)   Collection Time: 06/03/22  5:15 PM   Specimen: BLOOD  Result Value Ref Range Status   Specimen Description   Final    BLOOD SITE NOT SPECIFIED Performed at [facility] [address] [place], [place] [address]    Special Requests   Final    BOTTLES DRAWN AEROBIC AND ANAEROBIC Blood Culture adequate volume Performed at [facility] [address], [place], [place] [address]    Culture   Final    NO GROWTH < 12 HOURS Performed at [facility] [address], [place], [place] [address]    Report Status PENDING  Incomplete  Culture, blood (routine x 2)     Status: None (Preliminary result)   Collection Time: 06/03/22  5:22 PM   Specimen: BLOOD  Result Value Ref Range Status   Specimen Description   Final    BLOOD SITE NOT SPECIFIED Performed at [facility] [address] [place], [place] [address]    Special Requests   Final    BOTTLES DRAWN AEROBIC AND ANAEROBIC Blood Culture adequate volume Performed at [facility] [address], [place],  [address]  Culture   Final    NO GROWTH < 12 HOURS Performed at [facility], [address], [place], [place] [address]    Report Status PENDING  Incomplete         Radiology Studies: DG Tibia/Fibula Right  Result Date: 06/03/2022 CLINICAL DATA:  Right leg cellulitis. EXAM: RIGHT TIBIA AND FIBULA - 2 VIEW COMPARISON:  Right lower extremity CT 05/15/2022 FINDINGS: No evidence for fracture or dislocation. No cortical erosion or periosteal reaction. There are mild  degenerative changes of the knee. There is diffuse soft tissue swelling of the lower extremity. IMPRESSION: 1. Diffuse soft tissue swelling. 2. No acute bony abnormality. Electronically Signed   By: Darliss Cheney M.D.   On: 06/03/2022 18:52   VAS [place] LOWER EXTREMITY VENOUS (DVT) (7a-7p)  Result Date: 06/03/2022  Lower Venous DVT Study Patient Name:  KOLLYN LINGAFELTER  Date of Exam:   06/03/2022 Medical Rec #: [id number]          Accession #:    [id number] Date of Birth: 10-22-1976          Patient Gender: M Patient Age:   42 years Exam Location:  [facility] Procedure:      VAS [place] LOWER EXTREMITY VENOUS (DVT) Referring Phys: Army Melia --------------------------------------------------------------------------------  Indications: Pain, Edema, and Erythema. Other Indications: Patient recently hospitalized for RLE cellulitis - continued                    pain and redness with increased swelling. Open wounds to RLE                    calf. Limitations: Body habitus, poor ultrasound/tissue interface and open wound. Comparison Study: Previous exam on 05/13/22 was negative for DVT. Performing Technologist: Ernestene Mention RVT, RDMS  Examination Guidelines: A complete evaluation includes B-mode imaging, spectral Doppler, color Doppler, and power Doppler as needed of all accessible portions of each vessel. Bilateral testing is considered an integral part of a complete examination. Limited examinations for reoccurring indications may be performed as noted. The reflux portion of the exam is performed with the patient in reverse Trendelenburg.  +---------+---------------+---------+-----------+----------+--------------+ RIGHT    CompressibilityPhasicitySpontaneityPropertiesThrombus Aging +---------+---------------+---------+-----------+----------+--------------+ CFV      Full           Yes      Yes                                 +---------+---------------+---------+-----------+----------+--------------+ SFJ       Full                                                        +---------+---------------+---------+-----------+----------+--------------+ FV Prox  Full           Yes      Yes                                 +---------+---------------+---------+-----------+----------+--------------+ FV Mid                  Yes      Yes                                 +---------+---------------+---------+-----------+----------+--------------+  FV Distal               Yes      Yes                                 +---------+---------------+---------+-----------+----------+--------------+ PFV      Full                                                        +---------+---------------+---------+-----------+----------+--------------+ POP      Full           Yes      Yes                                 +---------+---------------+---------+-----------+----------+--------------+ PTV                                                   Not visualized +---------+---------------+---------+-----------+----------+--------------+ PERO                                                  Not visualized +---------+---------------+---------+-----------+----------+--------------+   Right Technical Findings: Calf vessels not visualized due to diffuse subcutaneous edema, open wounds/bandage to calf area, and patient discomfort. The distal and mid femoral vein are patent by color and doppler only.  +----+---------------+---------+-----------+----------+--------------+ LEFTCompressibilityPhasicitySpontaneityPropertiesThrombus Aging +----+---------------+---------+-----------+----------+--------------+ CFV Full           Yes      Yes                                 +----+---------------+---------+-----------+----------+--------------+    Summary: RIGHT: - There is no evidence of deep vein thrombosis in the lower extremity. However, portions of this examination were limited- see technologist  comments above.  - Diffuse subcutaneous edema from area of popliteal calf to ankle. - Ultrasound characteristics of enlarged lymph nodes are noted in the groin.  LEFT: - No evidence of common femoral vein obstruction. - Ultrasound characteristics of enlarged lymph nodes noted in the groin.  *See table(s) above for measurements and observations.    Preliminary         Scheduled Meds:  gabapentin  300 mg Oral TID   linaclotide  145 mcg Oral QODAY   losartan  25 mg Oral Daily   nicotine  14 mg Transdermal Daily   pantoprazole  40 mg Oral Q1200   Continuous Infusions:  cefTRIAXone (ROCEPHIN)  IV     vancomycin 1,500 mg (06/04/22 0515)     LOS: 1 day    Time spent:40 min    [name], Geraldo Docker, MD [facility]   If 7PM-7AM, please contact night-coverage 06/04/2022, 8:11 AM

## 2022-06-04 NOTE — Progress Notes (Signed)
                                                  Against Medical Advice Patient at this time expresses desire to leave the Hospital immediately, patient has been warned that this is not Medically advisable at this time, and can result in Medical complications like Death and Disability, patient understands and accepts the risks involved and assumes full responsibilty of this decision.  This patient has also been advised that if they feel the need for further medical assistance to return to any available ER or dial [phone].  Informed by Nursing staff that this patient has left care and has signed the form  Against Medical Advice on 06/04/2022 at 2015 Hrs.    Chinita Greenland BSN MSNA MSN ACNPC-AG Acute Care Nurse Practitioner [facility]

## 2022-06-04 NOTE — Consult Note (Signed)
WOC Nurse Consult Note: Reason for Consult: RLE cellulitis Sent from [facility] for evaluation Seen by wound care center 7/7. Reviewed chart, do not find ABIs in current records. [place] with [facility] MD about ABIs Wound type: Full and partial thickness wounds with acute cellulitis; etiology vasculitic?  Pressure Injury POA: NA Measurement:circumferential wounds of the RLE posterior and anterior calf/pretibial  First images from 05/10/22, began as blistering and acute erythema with edema. Lesion note pretibial. Acute onset after stretching and "hearing a pop" 05/10/22 leg is very much more edematous than the LLE. Wounds have worsened since  Wound bed:90% fibrinous slough over the entire RLE circumferentially,  Drainage (amount, consistency, odor) serous  Periwound:edema, appears somewhat improved, erythema improved from 05/10/22.  Dressing procedure/placement/frequency: Topical wound care for RLE, cover open wounds with silver hydrofiber for exudate management and antimicrobial properties, top with ABD pads, secure with kerlix from the toes to the knees. Wrap with Coban from toes to the knees with light stretch.    Follow up with wound care center as scheduled.     Re consult if needed, will not follow at this time. Thanks  Sunjai Levandoski [employer], RN,CWOCN, CNS, CWON-AP [phone])

## 2022-06-04 NOTE — Plan of Care (Signed)

## 2022-06-04 NOTE — TOC Benefit Eligibility Note (Signed)
Patient Product/process development scientist completed.    The patient is currently admitted and upon discharge could be taking linezolid (Zyvox) 600 mg tablets.  The current 30 day co-pay is, $0.00.   The patient is insured through [facility]     Roland Earl, CPhT Pharmacy Patient Advocate Specialist [facility] Patient Advocate Team Direct Number: [phone]  Fax: [phone]

## 2022-06-04 NOTE — Progress Notes (Addendum)
Patient does not want to be placed telemetry. RN educated and will try to do it later. MD on call is notified. Tele center was called and place on standby. Telemetry order is DC for now. Will report to day shift to follow up with hospitalist in the morning.

## 2022-06-04 NOTE — Evaluation (Signed)
Physical Therapy Evaluation Patient Details Name: [name] MRN: [id number] DOB: [birth year] Today's Date: 06/04/2022  History of Present Illness  patient is a 46 year old male who was admitted with cellulitis. PMH: L 4-5 stenosis, DDD, neuropathy, HTN, ADHD,  Clinical Impression  Patient is modified Independent, uses IV pole or does not need AD. Patient did use a RW to try out and requesting one for home as he reports that at times the  pain is great in the RLE and limited ambulation.  No further PT needs, will sig n off.      Recommendations for follow up therapy are one component of a multi-disciplinary discharge planning process, led by the attending physician.  Recommendations may be updated based on patient status, additional functional criteria and insurance authorization.  Follow Up Recommendations No PT follow up      Assistance Recommended at Discharge    Patient can return home with the following  Assist for transportation;Assistance with cooking/housework    Equipment Recommendations Rolling walker (2 wheels)  Recommendations for Other Services       Functional Status Assessment Patient has not had a recent decline in their functional status     Precautions / Restrictions Precautions Precaution Comments: R leg  cellulitis Restrictions Weight Bearing Restrictions: No      Mobility  Bed Mobility Overal bed mobility: Modified Independent                  Transfers Overall transfer level: Modified independent                      Ambulation/Gait Ambulation/Gait assistance: Modified independent (Device/Increase time) Gait Distance (Feet): 200 Feet Assistive device: IV Pole, Rolling walker (2 wheels) Gait Pattern/deviations: Step-through pattern, Antalgic Gait velocity: decr     General Gait Details: gait slow, steady, states RW is beneficial  Careers information officer    Modified Rankin (Stroke Patients Only)        Balance Overall balance assessment: No apparent balance deficits (not formally assessed)                                           Pertinent Vitals/Pain Pain Assessment Pain Score: 7  Pain Location: RLE Pain Descriptors / Indicators: Burning, Shooting Pain Intervention(s): Monitored during session, Premedicated before session    Home Living Family/patient expects to be discharged to:: Private residence Living Arrangements: Other relatives (brother) Available Help at Discharge: Family;Available 24 hours/day Type of Home: House Home Access: Level entry       Home Layout: One level   Additional Comments: plans to stay with mother and other sibling at time of d/c.    Prior Function Prior Level of Function : Independent/Modified Independent                     Hand Dominance        Extremity/Trunk Assessment   Upper Extremity Assessment Upper Extremity Assessment: Overall WFL for tasks assessed    Lower Extremity Assessment Lower Extremity Assessment: RLE deficits/detail RLE Deficits / Details: drainage and edema with  dressing intact, bear sweight    Cervical / Trunk Assessment Cervical / Trunk Assessment: Normal  Communication   Communication: No difficulties  Cognition  General Comments      Exercises     Assessment/Plan    PT Assessment Patient does not need any further PT services  PT Problem List         PT Treatment Interventions      PT Goals (Current goals can be found in the Care Plan section)  Acute Rehab PT Goals Patient Stated Goal: to  go home PT Goal Formulation: All assessment and education complete, DC therapy    Frequency       Co-evaluation PT/OT/SLP Co-Evaluation/Treatment: Yes Reason for Co-Treatment: To address functional/ADL transfers;For patient/therapist safety PT goals addressed during session: Mobility/safety with  mobility OT goals addressed during session: ADL's and self-care       AM-PAC PT "6 Clicks" Mobility  Outcome Measure Help needed turning from your back to your side while in a flat bed without using bedrails?: None Help needed moving from lying on your back to sitting on the side of a flat bed without using bedrails?: None Help needed moving to and from a bed to a chair (including a wheelchair)?: None Help needed standing up from a chair using your arms (e.g., wheelchair or bedside chair)?: None Help needed to walk in hospital room?: None Help needed climbing 3-5 steps with a railing? : A Little 6 Click Score: 23    End of Session   Activity Tolerance: Patient tolerated treatment well Patient left: in chair Nurse Communication: Mobility status PT Visit Diagnosis: Pain Pain - Right/Left: Right Pain - part of body: Leg    Time: 6834-1962 PT Time Calculation (min) (ACUTE ONLY): 15 min   Charges:   PT Evaluation $PT Eval Low Complexity: 1 Low          Blanchard Kelch PT Acute Rehabilitation Services Office [phone] Weekend [phone]   Rada Hay 06/04/2022, 12:26 PM

## 2022-06-05 LAB — RAPID URINE DRUG SCREEN, HOSP PERFORMED
Amphetamines: POSITIVE — AB
Barbiturates: NOT DETECTED
Benzodiazepines: NOT DETECTED
Cocaine: NOT DETECTED
Opiates: POSITIVE — AB
Tetrahydrocannabinol: POSITIVE — AB

## 2022-06-05 LAB — HCV INTERPRETATION

## 2022-06-05 LAB — HCV AB W REFLEX TO QUANT PCR: HCV Ab: NONREACTIVE

## 2022-06-08 LAB — CULTURE, BLOOD (ROUTINE X 2)
Culture: NO GROWTH
Culture: NO GROWTH
Special Requests: ADEQUATE
Special Requests: ADEQUATE

## 2022-06-20 ENCOUNTER — Encounter (HOSPITAL_BASED_OUTPATIENT_CLINIC_OR_DEPARTMENT_OTHER): Payer: Medicare Other | Admitting: Internal Medicine

## 2022-07-09 ENCOUNTER — Encounter (HOSPITAL_BASED_OUTPATIENT_CLINIC_OR_DEPARTMENT_OTHER): Payer: Medicare Other | Attending: Internal Medicine | Admitting: Internal Medicine

## 2022-09-18 DIAGNOSIS — Z79899 Other long term (current) drug therapy: Secondary | ICD-10-CM | POA: Diagnosis not present

## 2022-09-18 DIAGNOSIS — L03115 Cellulitis of right lower limb: Secondary | ICD-10-CM | POA: Insufficient documentation

## 2022-09-18 DIAGNOSIS — R2241 Localized swelling, mass and lump, right lower limb: Secondary | ICD-10-CM | POA: Diagnosis present

## 2022-09-19 ENCOUNTER — Emergency Department (HOSPITAL_COMMUNITY): Payer: Medicare Other

## 2022-09-19 ENCOUNTER — Emergency Department (HOSPITAL_COMMUNITY)
Admission: EM | Admit: 2022-09-19 | Discharge: 2022-09-19 | Disposition: A | Discharge status: Home / Self Care | Payer: Medicare Other | Attending: Emergency Medicine | Admitting: Emergency Medicine

## 2022-09-19 ENCOUNTER — Encounter (HOSPITAL_COMMUNITY): Payer: Self-pay | Admitting: Emergency Medicine

## 2022-09-19 DIAGNOSIS — L03115 Cellulitis of right lower limb: Secondary | ICD-10-CM | POA: Diagnosis not present

## 2022-09-19 LAB — CBC WITH DIFFERENTIAL/PLATELET
Abs Immature Granulocytes: 0.01 10*3/uL (ref 0.00–0.07)
Basophils Absolute: 0 10*3/uL (ref 0.0–0.1)
Basophils Relative: 0 %
Eosinophils Absolute: 0.2 10*3/uL (ref 0.0–0.5)
Eosinophils Relative: 3 %
HCT: 35.6 % — ABNORMAL LOW (ref 39.0–52.0)
Hemoglobin: 12 g/dL — ABNORMAL LOW (ref 13.0–17.0)
Immature Granulocytes: 0 %
Lymphocytes Relative: 28 %
Lymphs Abs: 2.2 10*3/uL (ref 0.7–4.0)
MCH: 28.6 pg (ref 26.0–34.0)
MCHC: 33.7 g/dL (ref 30.0–36.0)
MCV: 85 fL (ref 80.0–100.0)
Monocytes Absolute: 0.5 10*3/uL (ref 0.1–1.0)
Monocytes Relative: 6 %
Neutro Abs: 5 10*3/uL (ref 1.7–7.7)
Neutrophils Relative %: 63 %
Platelets: 211 10*3/uL (ref 150–400)
RBC: 4.19 MIL/uL — ABNORMAL LOW (ref 4.22–5.81)
RDW: 12.4 % (ref 11.5–15.5)
WBC: 7.9 10*3/uL (ref 4.0–10.5)
nRBC: 0 % (ref 0.0–0.2)

## 2022-09-19 LAB — BASIC METABOLIC PANEL
Anion gap: 6 (ref 5–15)
BUN: 19 mg/dL (ref 6–20)
CO2: 27 mmol/L (ref 22–32)
Calcium: 8.6 mg/dL — ABNORMAL LOW (ref 8.9–10.3)
Chloride: 103 mmol/L (ref 98–111)
Creatinine, Ser: 0.86 mg/dL (ref 0.61–1.24)
GFR, Estimated: >60 mL/min (ref >60)
Glucose, Bld: 116 mg/dL — ABNORMAL HIGH (ref 70–99)
Potassium: 4.1 mmol/L (ref 3.5–5.1)
Sodium: 136 mmol/L (ref 135–145)

## 2022-09-19 LAB — LACTIC ACID, PLASMA: Lactic Acid, Venous: 0.8 mmol/L (ref 0.5–1.9)

## 2022-09-19 MED ORDER — CLINDAMYCIN HCL 150 MG PO CAPS
300.0000 mg | ORAL_CAPSULE | Freq: Once | ORAL | Status: AC
  Administered 2022-09-19: 300 mg via ORAL
  Filled 2022-09-19: qty 2

## 2022-09-19 MED ORDER — CLINDAMYCIN HCL 150 MG PO CAPS: 300.0000 mg | ORAL_CAPSULE | Freq: Four times a day (QID) | ORAL | 0 refills | Status: AC

## 2022-09-19 NOTE — ED Provider Notes (Signed)
[facility] EMERGENCY DEPARTMENT Provider Note   CSN: [id number] Arrival date & time: 09/18/22  2349     History  Chief Complaint  Patient presents with   [facility] Clearance    [name] is a 46 y.o. male.  Patient was brought to the emergency department by [facility].  Patient was arrested tonight and will was not excepted to the jail because of redness and swelling of his right leg.  Patient reports a history of recurrent cellulitis with similar symptoms.  He reports that he has been having problems for the last few days.       Home Medications Prior to Admission medications   Medication Sig Start Date End Date Taking? Authorizing Provider  acetaminophen (TYLENOL) 325 MG tablet Take 2 tablets (650 mg total) by mouth every 6 (six) hours as needed for moderate pain. Patient not taking: Reported on 06/03/2022 05/17/22   Arrien, York Ram, MD  gabapentin (NEURONTIN) 300 MG capsule Take 300 mg by mouth 3 (three) times daily.    [provider]  gabapentin (NEURONTIN) 800 MG tablet Take 1 tablet (800 mg total) by mouth 2 (two) times daily. Patient not taking: Reported on 06/03/2022 11/24/17   Kerrin Champagne, MD  ibuprofen (ADVIL) 400 MG tablet Take 1 tablet (400 mg total) by mouth every 6 (six) hours as needed (leg pain). Patient taking differently: Take 400-600 mg by mouth every 6 (six) hours as needed for mild pain or headache (leg pain). 05/17/22   Arrien, York Ram, MD  LINZESS 145 MCG CAPS capsule Take 145 mcg by mouth every other day. 04/23/22   [provider]  losartan (COZAAR) 25 MG tablet Take 1 tablet (25 mg total) by mouth daily. 05/18/22 06/17/22  Arrien, York Ram, MD  pantoprazole (PROTONIX) 20 MG tablet Take 20 mg by mouth daily before breakfast. 04/29/22   [provider]  promethazine (PHENERGAN) 25 MG tablet Take 25 mg by mouth every 8 (eight) hours as needed for nausea/vomiting. Patient not taking:  Reported on 06/03/2022 01/25/22   [provider]  SSD 1 % cream Apply 1 Application topically See admin instructions. Apply a thin layer to affected skin 1-2 times daily as needed for cellulitis    [provider]  traMADol (ULTRAM) 50 MG tablet Take 50 mg by mouth every 8 (eight) hours as needed (for pain).    [provider]      Allergies    Boudreauxs baby butt smooth [aquaphilic], Tramadol, and Vicodin [hydrocodone-acetaminophen]    Review of Systems   Review of Systems  Physical Exam Updated Vital Signs BP (!) 163/93 (BP Location: Right Arm)   Pulse 75   Temp 98.3 F (36.8 C) (Oral)   Resp 20   Ht 5\' 11"  (1.803 m)   Wt (!) 156.9 kg   SpO2 100%   BMI 48.24 kg/m  Physical Exam Vitals and nursing note reviewed.  Constitutional:      General: He is not in acute distress.    Appearance: He is well-developed.  HENT:     Head: Normocephalic and atraumatic.     Mouth/Throat:     Mouth: Mucous membranes are moist.  Eyes:     General: Vision grossly intact. Gaze aligned appropriately.     Extraocular Movements: Extraocular movements intact.     Conjunctiva/sclera: Conjunctivae normal.  Cardiovascular:     Rate and Rhythm: Normal rate and regular rhythm.     Pulses: Normal pulses.  Heart sounds: Normal heart sounds, S1 normal and S2 normal. No murmur heard.    No friction rub. No gallop.  Pulmonary:     Effort: Pulmonary effort is normal. No respiratory distress.     Breath sounds: Normal breath sounds.  Abdominal:     Palpations: Abdomen is soft.     Tenderness: There is no abdominal tenderness. There is no guarding or rebound.     Hernia: No hernia is present.  Musculoskeletal:        General: No swelling.     Cervical back: Full passive range of motion without pain, normal range of motion and neck supple. No pain with movement, spinous process tenderness or muscular tenderness. Normal range of motion.     Right lower leg: Edema present.      Left lower leg: Edema present.  Skin:    General: Skin is warm and dry.     Capillary Refill: Capillary refill takes less than 2 seconds.     Findings: No ecchymosis or wound.     Comments: Sequela of chronic lymphedema of right lower extremity with some chronic ulcerations anteriorly and erythema present, no drainage  Neurological:     Mental Status: He is alert and oriented to person, place, and time.     GCS: GCS eye subscore is 4. GCS verbal subscore is 5. GCS motor subscore is 6.     Cranial Nerves: Cranial nerves 2-12 are intact.     Sensory: Sensation is intact.     Motor: Motor function is intact. No weakness or abnormal muscle tone.     Coordination: Coordination is intact.  Psychiatric:        Mood and Affect: Mood normal.        Speech: Speech normal.        Behavior: Behavior normal.     ED Results / Procedures / Treatments   Labs (all labs ordered are listed, but only abnormal results are displayed) Labs Reviewed  CBC WITH DIFFERENTIAL/PLATELET - Abnormal; Notable for the following components:      Result Value   RBC 4.19 (*)    Hemoglobin 12.0 (*)    HCT 35.6 (*)    All other components within normal limits  BASIC METABOLIC PANEL - Abnormal; Notable for the following components:   Glucose, Bld 116 (*)    Calcium 8.6 (*)    All other components within normal limits  LACTIC ACID, PLASMA    EKG None  Radiology DG Tibia/Fibula Right  Result Date: 09/19/2022 CLINICAL DATA:  Right leg infection, initial encounter EXAM: RIGHT TIBIA AND FIBULA - 2 VIEW COMPARISON:  06/03/2022 FINDINGS: Considerable soft tissue swelling is noted which has increased in the interval from the prior exam. A few small wounds are noted laterally along the distal lower leg. No acute bony abnormality is seen. No erosive changes are noted. IMPRESSION: Soft tissue changes consistent with the given clinical history. No acute bony abnormality is noted. Electronically Signed   By: Inez Catalina  M.D.   On: 09/19/2022 01:40    Procedures Procedures    Medications Ordered in ED Medications  clindamycin (CLEOCIN) capsule 300 mg (has no administration in time range)    ED Course/ Medical Decision Making/ A&P                           Medical Decision Making Amount and/or Complexity of Data Reviewed External Data Reviewed: labs and notes. Labs:  ordered. Decision-making details documented in ED Course. Radiology: ordered and independent interpretation performed. Decision-making details documented in ED Course.  Risk Prescription drug management.   Patient with redness, chronic ulceration of the leg.  Much of this appears chronic, secondary to chronic lymphedema of the leg.  He appears well.  He is afebrile.  Vital signs are unremarkable.  Normal white blood cell count, normal lactic acid.  X-ray without gas formation or signs of osteomyelitis.  Patient does not require hospitalization at this time.  Will initiate clindamycin.  Discharge into the custody of [facility] deputy to return to jail.        Final Clinical Impression(s) / ED Diagnoses Final diagnoses:  Cellulitis of right lower extremity    Rx / DC Orders ED Discharge Orders     None         [place] Searles, Canary Brim, MD 09/19/22 0150

## 2022-09-19 NOTE — ED Triage Notes (Signed)
Pt brought in by [employer] for clearance for jail. Pt c/o redness and swelling to right lower leg. Pt states he was admitted a few months ago for same and was given IV medications but hasn't followed up with his PCP.

## 2023-09-25 ENCOUNTER — Encounter (HOSPITAL_COMMUNITY): Payer: Self-pay

## 2023-09-25 ENCOUNTER — Emergency Department (HOSPITAL_COMMUNITY)
Admission: EM | Admit: 2023-09-25 | Discharge: 2023-09-26 | Disposition: A | Discharge status: Home / Self Care | Payer: Medicare Other | Attending: Emergency Medicine | Admitting: Emergency Medicine

## 2023-09-25 ENCOUNTER — Other Ambulatory Visit: Payer: Self-pay

## 2023-09-25 DIAGNOSIS — R4182 Altered mental status, unspecified: Secondary | ICD-10-CM | POA: Diagnosis not present

## 2023-09-25 DIAGNOSIS — M79604 Pain in right leg: Secondary | ICD-10-CM | POA: Diagnosis present

## 2023-09-25 DIAGNOSIS — I890 Lymphedema, not elsewhere classified: Secondary | ICD-10-CM | POA: Diagnosis not present

## 2023-09-25 DIAGNOSIS — F1999 Other psychoactive substance use, unspecified with unspecified psychoactive substance-induced disorder: Secondary | ICD-10-CM | POA: Diagnosis not present

## 2023-09-25 DIAGNOSIS — F1990 Other psychoactive substance use, unspecified, uncomplicated: Secondary | ICD-10-CM

## 2023-09-25 LAB — CBC WITH DIFFERENTIAL/PLATELET
Abs Immature Granulocytes: 0.02 10*3/uL (ref 0.00–0.07)
Basophils Absolute: 0 10*3/uL (ref 0.0–0.1)
Basophils Relative: 0 %
Eosinophils Absolute: 0.2 10*3/uL (ref 0.0–0.5)
Eosinophils Relative: 3 %
HCT: 34.7 % — ABNORMAL LOW (ref 39.0–52.0)
Hemoglobin: 12.1 g/dL — ABNORMAL LOW (ref 13.0–17.0)
Immature Granulocytes: 0 %
Lymphocytes Relative: 23 %
Lymphs Abs: 1.6 10*3/uL (ref 0.7–4.0)
MCH: 30.9 pg (ref 26.0–34.0)
MCHC: 34.9 g/dL (ref 30.0–36.0)
MCV: 88.7 fL (ref 80.0–100.0)
Monocytes Absolute: 0.5 10*3/uL (ref 0.1–1.0)
Monocytes Relative: 7 %
Neutro Abs: 4.9 10*3/uL (ref 1.7–7.7)
Neutrophils Relative %: 67 %
Platelets: 204 10*3/uL (ref 150–400)
RBC: 3.91 MIL/uL — ABNORMAL LOW (ref 4.22–5.81)
RDW: 13 % (ref 11.5–15.5)
WBC: 7.3 10*3/uL (ref 4.0–10.5)
nRBC: 0 % (ref 0.0–0.2)

## 2023-09-25 LAB — RAPID URINE DRUG SCREEN, HOSP PERFORMED
Amphetamines: POSITIVE — AB
Barbiturates: NOT DETECTED
Benzodiazepines: NOT DETECTED
Cocaine: POSITIVE — AB
Opiates: NOT DETECTED
Tetrahydrocannabinol: POSITIVE — AB

## 2023-09-25 LAB — COMPREHENSIVE METABOLIC PANEL
ALT: 18 U/L (ref 0–44)
AST: 20 U/L (ref 15–41)
Albumin: 3.7 g/dL (ref 3.5–5.0)
Alkaline Phosphatase: 81 U/L (ref 38–126)
Anion gap: 6 (ref 5–15)
BUN: 20 mg/dL (ref 6–20)
CO2: 25 mmol/L (ref 22–32)
Calcium: 8.6 mg/dL — ABNORMAL LOW (ref 8.9–10.3)
Chloride: 107 mmol/L (ref 98–111)
Creatinine, Ser: 1 mg/dL (ref 0.61–1.24)
GFR, Estimated: >60 mL/min (ref >60)
Glucose, Bld: 131 mg/dL — ABNORMAL HIGH (ref 70–99)
Potassium: 3.6 mmol/L (ref 3.5–5.1)
Sodium: 138 mmol/L (ref 135–145)
Total Bilirubin: 0.5 mg/dL (ref 0.3–1.2)
Total Protein: 7.2 g/dL (ref 6.5–8.1)

## 2023-09-25 LAB — SALICYLATE LEVEL: Salicylate Lvl: <7 mg/dL — ABNORMAL LOW (ref 7.0–30.0)

## 2023-09-25 LAB — ETHANOL: Alcohol, Ethyl (B): <10 mg/dL (ref <10)

## 2023-09-25 LAB — ACETAMINOPHEN LEVEL: Acetaminophen (Tylenol), Serum: <10 ug/mL — ABNORMAL LOW (ref 10–30)

## 2023-09-25 NOTE — ED Triage Notes (Signed)
Pt BIB [facility] d/t possible drug OD.  Ptwas found face down in an aisle at [employer] with pinpoint pupils ( PT DENIES DRUG OD).  Pt was given 2 mg IN by Fire - he ahd agonal breathing for 2-3 minutes and they used BVM for 10 minutes.  Pt is awake in triage but falls asleep.  Pt c/o right leg pain only but face was bleeding with Fire.

## 2023-09-25 NOTE — ED Provider Notes (Signed)
[place] EMERGENCY DEPARTMENT AT [facility] Provider Note   CSN: [id number] Arrival date & time: 09/25/23  2214     History  Chief Complaint  Patient presents with   Drug Overdose   Fall    [name] is a 47 y.o. male.  The history is provided by the patient and medical records.  Drug Overdose  Fall   47 y.o. M with history of degenerative disc disease, anemia, anxiety, depression, GERD, chronic lymphedema of legs, presenting to the ED after being found down all night aisle at [employer].  Patient found to have pinpoint pupils by EMS, given 2mg  IN narcan (could not establish IV).  He apparently had some agonal respirations en route, assisted with BVM for about 10 minutes.  He is awake/alert on arrival to ED however.  He denies any drug use.  His only complaint currently is right leg pain. Has chronic lymphedema and recurrent cellulitis of right leg.   Denies SI/HI.  Home Medications Prior to Admission medications   Medication Sig Start Date End Date Taking? Authorizing Provider  acetaminophen (TYLENOL) 325 MG tablet Take 2 tablets (650 mg total) by mouth every 6 (six) hours as needed for moderate pain. Patient not taking: Reported on 06/03/2022 05/17/22   Arrien, York Ram, MD  clindamycin (CLEOCIN) 150 MG capsule Take 2 capsules (300 mg total) by mouth 4 (four) times daily. 09/19/22   Gilda Crease, MD  gabapentin (NEURONTIN) 300 MG capsule Take 300 mg by mouth 3 (three) times daily.    [provider]  gabapentin (NEURONTIN) 800 MG tablet Take 1 tablet (800 mg total) by mouth 2 (two) times daily. Patient not taking: Reported on 06/03/2022 11/24/17   Kerrin Champagne, MD  ibuprofen (ADVIL) 400 MG tablet Take 1 tablet (400 mg total) by mouth every 6 (six) hours as needed (leg pain). Patient taking differently: Take 400-600 mg by mouth every 6 (six) hours as needed for mild pain or headache (leg pain). 05/17/22   Arrien,  York Ram, MD  LINZESS 145 MCG CAPS capsule Take 145 mcg by mouth every other day. 04/23/22   [provider]  losartan (COZAAR) 25 MG tablet Take 1 tablet (25 mg total) by mouth daily. 05/18/22 06/17/22  Arrien, York Ram, MD  pantoprazole (PROTONIX) 20 MG tablet Take 20 mg by mouth daily before breakfast. 04/29/22   [provider]  promethazine (PHENERGAN) 25 MG tablet Take 25 mg by mouth every 8 (eight) hours as needed for nausea/vomiting. Patient not taking: Reported on 06/03/2022 01/25/22   [provider]  SSD 1 % cream Apply 1 Application topically See admin instructions. Apply a thin layer to affected skin 1-2 times daily as needed for cellulitis    [provider]  traMADol (ULTRAM) 50 MG tablet Take 50 mg by mouth every 8 (eight) hours as needed (for pain).    [provider]      Allergies    Boudreauxs baby butt smooth [aquaphilic], Tramadol, and Vicodin [hydrocodone-acetaminophen]    Review of Systems   Review of Systems  Psychiatric/Behavioral:         Drug OD  All other systems reviewed and are negative.   Physical Exam Updated Vital Signs BP (!) 140/96 (BP Location: Right Arm)   Pulse 81   Temp 97.9 F (36.6 C) (Oral)   Resp 14   Ht 5\' 11"  (1.803 m)   Wt (!) 158.8 kg   SpO2  96%   BMI 48.82 kg/m   Physical Exam Vitals and nursing note reviewed.  Constitutional:      Appearance: He is well-developed.     Comments: Awake/alert when spoken to, intermittently bobbing head and dozing off when left alone  HENT:     Head: Normocephalic and atraumatic.  Eyes:     Conjunctiva/sclera: Conjunctivae normal.     Pupils: Pupils are equal, round, and reactive to light.  Cardiovascular:     Rate and Rhythm: Normal rate and regular rhythm.     Heart sounds: Normal heart sounds.  Pulmonary:     Effort: Pulmonary effort is normal.     Breath sounds: Normal breath sounds.  Abdominal:     General: Bowel sounds are normal.      Palpations: Abdomen is soft.  Musculoskeletal:        General: Normal range of motion.     Cervical back: Normal range of motion.     Comments: Chronic appearing lymphedema of BLE, right > left (chronic per patient), no significant erythema or induration, no open wounds/sores, no weeping of the skin  Skin:    General: Skin is warm and dry.  Neurological:     Mental Status: He is alert and oriented to person, place, and time.     ED Results / Procedures / Treatments   Labs (all labs ordered are listed, but only abnormal results are displayed) Labs Reviewed  CBC WITH DIFFERENTIAL/PLATELET - Abnormal; Notable for the following components:      Result Value   RBC 3.91 (*)    Hemoglobin 12.1 (*)    HCT 34.7 (*)    All other components within normal limits  COMPREHENSIVE METABOLIC PANEL - Abnormal; Notable for the following components:   Glucose, Bld 131 (*)    Calcium 8.6 (*)    All other components within normal limits  RAPID URINE DRUG SCREEN, HOSP PERFORMED - Abnormal; Notable for the following components:   Cocaine POSITIVE (*)    Amphetamines POSITIVE (*)    Tetrahydrocannabinol POSITIVE (*)    All other components within normal limits  SALICYLATE LEVEL - Abnormal; Notable for the following components:   Salicylate Lvl <7.0 (*)    All other components within normal limits  ACETAMINOPHEN LEVEL - Abnormal; Notable for the following components:   Acetaminophen (Tylenol), Serum <10 (*)    All other components within normal limits  ETHANOL    EKG None  Radiology No results found.  Procedures Procedures    Medications Ordered in ED Medications - No data to display  ED Course/ Medical Decision Making/ A&P                                 Medical Decision Making Amount and/or Complexity of Data Reviewed Labs: ordered. ECG/medicine tests: ordered and independent interpretation performed.   47 year old male presenting to the ED after being found down in an  aisle at [employer].  Given IN narcan by fire with improvement.  AAOx3 on arrival.  He denies drug use tonight.  His only complaint is right leg pain-- chronic lymphedema.  He does not really have any significant erythema or induration on my exam.  He is afebrile, not tachycardic.  This seems more consistent with chronic lymphedema.  Will check EKG, labs.  Labs as above--no leukocytosis, no significant electrolyte derangement.  Negative ethanol, Tylenol, and salicylate levels.  UDS  is positive for cocaine, amphetamines, and THC.  Will observe here.  2:26 AM Has been observed here 4 hours post EMS narcan.  Has not required further intervention.  VSS.  He is ambulatory without difficulty.  He wants to leave, feel this is appropriate.  Encouraged to refrain from illicit drug use.  Can follow-up with PCP.  Return here for new concerns.  Final Clinical Impression(s) / ED Diagnoses Final diagnoses:  Substance use    Rx / DC Orders ED Discharge Orders     None         Garlon Hatchet, PA-C 09/26/23 9528    Bethann Berkshire, MD 09/29/23 [phone]

## 2023-09-26 NOTE — ED Notes (Signed)
Pt ambulated to bathroom without assistance w/ a steady gait. After returning from bathroom, pt requested to leave against medical advice. After speaking with the provider. The provider determined that he was okay to leave. RN asked A&O questions to make sure pt wasn't disoriented. Pt answered all questions correctly without hesitation.

## 2023-09-26 NOTE — Discharge Instructions (Signed)
Refrain from street drugs. Follow-up with your primary care doctor. Return here for new concerns.

## 2023-09-26 NOTE — ED Notes (Signed)
Pt ambulated to the bathroom with a steady gait and w/out any complaints.Sandwich and drink provided.

## 2023-10-13 ENCOUNTER — Encounter (HOSPITAL_COMMUNITY): Payer: Self-pay

## 2023-10-13 ENCOUNTER — Other Ambulatory Visit: Payer: Self-pay

## 2023-10-13 ENCOUNTER — Emergency Department (HOSPITAL_COMMUNITY)
Admission: EM | Admit: 2023-10-13 | Discharge: 2023-10-13 | Disposition: A | Discharge status: Home / Self Care | Payer: Medicare Other | Attending: Emergency Medicine | Admitting: Emergency Medicine

## 2023-10-13 DIAGNOSIS — X58XXXA Exposure to other specified factors, initial encounter: Secondary | ICD-10-CM | POA: Insufficient documentation

## 2023-10-13 DIAGNOSIS — R600 Localized edema: Secondary | ICD-10-CM | POA: Insufficient documentation

## 2023-10-13 DIAGNOSIS — T50901A Poisoning by unspecified drugs, medicaments and biological substances, accidental (unintentional), initial encounter: Secondary | ICD-10-CM | POA: Diagnosis present

## 2023-10-13 LAB — COMPREHENSIVE METABOLIC PANEL
ALT: 18 U/L (ref 0–44)
AST: 21 U/L (ref 15–41)
Albumin: 2.7 g/dL — ABNORMAL LOW (ref 3.5–5.0)
Alkaline Phosphatase: 71 U/L (ref 38–126)
Anion gap: 6 (ref 5–15)
BUN: 13 mg/dL (ref 6–20)
CO2: 23 mmol/L (ref 22–32)
Calcium: 7.4 mg/dL — ABNORMAL LOW (ref 8.9–10.3)
Chloride: 112 mmol/L — ABNORMAL HIGH (ref 98–111)
Creatinine, Ser: 1.47 mg/dL — ABNORMAL HIGH (ref 0.61–1.24)
GFR, Estimated: 59 mL/min — ABNORMAL LOW (ref >60)
Glucose, Bld: 115 mg/dL — ABNORMAL HIGH (ref 70–99)
Potassium: 3.3 mmol/L — ABNORMAL LOW (ref 3.5–5.1)
Sodium: 141 mmol/L (ref 135–145)
Total Bilirubin: 0.5 mg/dL (ref <1.2)
Total Protein: 5.7 g/dL — ABNORMAL LOW (ref 6.5–8.1)

## 2023-10-13 LAB — RAPID URINE DRUG SCREEN, HOSP PERFORMED
Amphetamines: POSITIVE — AB
Barbiturates: NOT DETECTED
Benzodiazepines: NOT DETECTED
Cocaine: NOT DETECTED
Opiates: NOT DETECTED
Tetrahydrocannabinol: NOT DETECTED

## 2023-10-13 LAB — CBC
HCT: 32.9 % — ABNORMAL LOW (ref 39.0–52.0)
Hemoglobin: 10.8 g/dL — ABNORMAL LOW (ref 13.0–17.0)
MCH: 29.4 pg (ref 26.0–34.0)
MCHC: 32.8 g/dL (ref 30.0–36.0)
MCV: 89.6 fL (ref 80.0–100.0)
Platelets: 227 10*3/uL (ref 150–400)
RBC: 3.67 MIL/uL — ABNORMAL LOW (ref 4.22–5.81)
RDW: 13 % (ref 11.5–15.5)
WBC: 5.3 10*3/uL (ref 4.0–10.5)
nRBC: 0 % (ref 0.0–0.2)

## 2023-10-13 LAB — ACETAMINOPHEN LEVEL: Acetaminophen (Tylenol), Serum: <10 ug/mL — ABNORMAL LOW (ref 10–30)

## 2023-10-13 LAB — SALICYLATE LEVEL: Salicylate Lvl: <7 mg/dL — ABNORMAL LOW (ref 7.0–30.0)

## 2023-10-13 LAB — ETHANOL: Alcohol, Ethyl (B): <10 mg/dL (ref <10)

## 2023-10-13 NOTE — ED Provider Notes (Signed)
[place] EMERGENCY DEPARTMENT AT [facility] Provider Note   CSN: [id number] Arrival date & time: 10/13/23  1605     History Chief Complaint  Patient presents with   Drug Overdose    HPI [name] is a 47 y.o. male presenting for altered mental status.  Possible drug overdose.  Patient does endorse a history of drug use disorder.  Cannot remember the last time he used illicit substances.  States he thought it was this morning but it might of been last night.  He was sitting at the gas station when he was found obtunded.  EMS found him GCS 8 and gave him Narcan.  He improved and transferred now GCS 14 there is still has some mildly pinpoint pupils. He denies any concerns.  He does state that he is having worsening bilateral lower extremity edema over the past year since his last PCP appointment states he has not been making appointments with his primary. Denies fevers chills nausea vomiting syncope shortness of breath otherwise..   Patient's recorded medical, surgical, social, medication list and allergies were reviewed in the Snapshot window as part of the initial history.   Review of Systems   Review of Systems  Constitutional:  Negative for chills and fever.  HENT:  Negative for ear pain and sore throat.   Eyes:  Negative for pain and visual disturbance.  Respiratory:  Negative for cough and shortness of breath.   Cardiovascular:  Positive for leg swelling. Negative for chest pain and palpitations.  Gastrointestinal:  Negative for abdominal pain and vomiting.  Genitourinary:  Negative for dysuria and hematuria.  Musculoskeletal:  Negative for arthralgias and back pain.  Skin:  Negative for color change and rash.  Neurological:  Positive for syncope. Negative for seizures.  All other systems reviewed and are negative.   Physical Exam Updated Vital Signs BP 116/78   Pulse 90   Temp 98 F (36.7 C) (Oral)   Resp 10   SpO2 99%  Physical Exam Vitals and  nursing note reviewed.  Constitutional:      General: He is not in acute distress.    Appearance: He is well-developed.  HENT:     Head: Normocephalic and atraumatic.  Eyes:     Conjunctiva/sclera: Conjunctivae normal.  Cardiovascular:     Rate and Rhythm: Normal rate and regular rhythm.     Heart sounds: No murmur heard. Pulmonary:     Effort: Pulmonary effort is normal. No respiratory distress.     Breath sounds: Normal breath sounds.  Abdominal:     Palpations: Abdomen is soft.     Tenderness: There is no abdominal tenderness.  Musculoskeletal:        General: Deformity (Substantial bilateral lower extremity edema.  Some erythema and impetiginous appearing lesions to his left anterior shin.) present. No swelling.     Cervical back: Neck supple.     Right lower leg: Edema present.     Left lower leg: Edema present.  Skin:    General: Skin is warm and dry.     Capillary Refill: Capillary refill takes less than 2 seconds.  Neurological:     Mental Status: He is alert.  Psychiatric:        Mood and Affect: Mood normal.      ED Course/ Medical Decision Making/ A&P    Procedures Procedures   Medications Ordered in ED Medications - No data to display  Medical Decision Making  47 year old male presenting after  an overdose.  He does not recall when the last time he used drugs was. Responded well to Narcan appears to be improving at this time.  He does have lower extremity erythema and an area of apparent impetigo. Will evaluate for more severe infection/alternative etiology of his syncope with the coingestants screen, screening lab work including CBC, BMP to evaluate for severity of infection or other etiology of his bilateral lower extremity edema given his loss of follow-up with primary care.  Will follow-up the above results for any acute pathology plan to refer to medicine in the outpatient setting for primary care if all reassuring.  Denied suicidal homicidal ideation  or intentionality to his overdose.  Reassessment: The results resulted with no acute pathology.  Interesting his opiate screen was negative but his exam is remains consistent with an opiate overdose. I may be worried about a synthetic opiate such as fentanyl given his presentation especially in the setting of amphetamine positivity and endorsed intentional methamphetamine use earlier today. Likely mixed into his product.  Still nonspecific differential.  Could be alternative etiology of his AMS, I was called emergently to bedside as he has packed all of his belongings and stated that he is leaving.  Informed him that he has not undergone enough observation to rule out need for further Narcan and he expressed understanding but stated that he understands the risk but he is not staying here in the emergency department any longer.  He is in a hall bed and expresses discomfort states that he needs to get out before the bus is all closed.  Given his understanding of risk and well appearance at this time and expressed understanding and demonstration of capacity patient's able to make his own decisions even against my advice. Patient stated he would not pick up any medication. Disposition:  Patient is requesting discharge at this time.  Given patient's understanding of risk of severe missed diagnosis based on limitations of today's evaluation and risk of interval worsening of disease including life or limb threatening pathology, will participate in shared medical decision making and patient directed discharge at this time.  Patient is welcome to return for further diagnostic evaluation/therapeutic management at any time. . Clinical Impression:  1. Accidental overdose, initial encounter      Discharge   Final Clinical Impression(s) / ED Diagnoses Final diagnoses:  Accidental overdose, initial encounter    Rx / DC Orders ED Discharge Orders     None         Glyn Ade, MD 10/13/23  1805

## 2023-10-13 NOTE — ED Triage Notes (Addendum)
Pt BIB [facility] with reports of possible OD and stumbling in gas station. Per EMS pinpoint pupils and RR of 10. 0.5 Narcan given IN by EMS. All other VSS per EMS. Pt reports opioid drug use yesterday but only marijuana earlier this afternoon. Reports was completely normal at gas station and all of a sudden felt dizzy and faint. Pt awake but falling asleep in triage. AOx4

## 2023-10-22 ENCOUNTER — Encounter (HOSPITAL_COMMUNITY): Payer: Self-pay

## 2023-10-22 ENCOUNTER — Emergency Department (HOSPITAL_COMMUNITY)
Admission: EM | Admit: 2023-10-22 | Discharge: 2023-10-22 | Disposition: A | Discharge status: Home / Self Care | Payer: Medicare Other | Attending: Emergency Medicine | Admitting: Emergency Medicine

## 2023-10-22 ENCOUNTER — Other Ambulatory Visit: Payer: Self-pay

## 2023-10-22 DIAGNOSIS — T401X1A Poisoning by heroin, accidental (unintentional), initial encounter: Secondary | ICD-10-CM | POA: Insufficient documentation

## 2023-10-22 DIAGNOSIS — X58XXXA Exposure to other specified factors, initial encounter: Secondary | ICD-10-CM | POA: Diagnosis not present

## 2023-10-22 DIAGNOSIS — T50901A Poisoning by unspecified drugs, medicaments and biological substances, accidental (unintentional), initial encounter: Secondary | ICD-10-CM | POA: Diagnosis present

## 2023-10-22 NOTE — ED Notes (Signed)
Pt discharged, all questions answered. Did not want to wait on D/C papers. Patient waked out, steady gait. NAD

## 2023-10-22 NOTE — ED Notes (Signed)
Pt arrived by ambulance reporting patient he was in his car smoking marijuana he passed out. States this has happened before

## 2023-10-22 NOTE — ED Provider Notes (Signed)
[place] EMERGENCY DEPARTMENT AT [facility] Provider Note   CSN: [id number] Arrival date & time: 10/22/23  1131     History  No chief complaint on file.   [name] is a 47 y.o. male.  47 year old male presents after an intentional overdose.  Patient states that he was smoke marijuana when he passed out.  Was found in his car by a [employer] station.  Was given 1 mg of Narcan IN with good response.  Patient states that he does use heroin occasionally.  States that he did not think there was heroin in his marijuana currently.  Denies any alcohol use.  States he has no trouble breathing.  Feels back to his baseline       Home Medications Prior to Admission medications   Medication Sig Start Date End Date Taking? Authorizing Provider  acetaminophen (TYLENOL) 325 MG tablet Take 2 tablets (650 mg total) by mouth every 6 (six) hours as needed for moderate pain. Patient not taking: Reported on 06/03/2022 05/17/22   Arrien, York Ram, MD  clindamycin (CLEOCIN) 150 MG capsule Take 2 capsules (300 mg total) by mouth 4 (four) times daily. 09/19/22   Gilda Crease, MD  gabapentin (NEURONTIN) 300 MG capsule Take 300 mg by mouth 3 (three) times daily.    [provider]  gabapentin (NEURONTIN) 800 MG tablet Take 1 tablet (800 mg total) by mouth 2 (two) times daily. Patient not taking: Reported on 06/03/2022 11/24/17   Kerrin Champagne, MD  ibuprofen (ADVIL) 400 MG tablet Take 1 tablet (400 mg total) by mouth every 6 (six) hours as needed (leg pain). Patient taking differently: Take 400-600 mg by mouth every 6 (six) hours as needed for mild pain or headache (leg pain). 05/17/22   Arrien, York Ram, MD  LINZESS 145 MCG CAPS capsule Take 145 mcg by mouth every other day. 04/23/22   [provider]  losartan (COZAAR) 25 MG tablet Take 1 tablet (25 mg total) by mouth daily. 05/18/22 06/17/22  Arrien, York Ram, MD  pantoprazole (PROTONIX) 20 MG  tablet Take 20 mg by mouth daily before breakfast. 04/29/22   [provider]  promethazine (PHENERGAN) 25 MG tablet Take 25 mg by mouth every 8 (eight) hours as needed for nausea/vomiting. Patient not taking: Reported on 06/03/2022 01/25/22   [provider]  SSD 1 % cream Apply 1 Application topically See admin instructions. Apply a thin layer to affected skin 1-2 times daily as needed for cellulitis    [provider]  traMADol (ULTRAM) 50 MG tablet Take 50 mg by mouth every 8 (eight) hours as needed (for pain).    [provider]      Allergies    Boudreauxs baby butt smooth [aquaphilic], Tramadol, and Vicodin [hydrocodone-acetaminophen]    Review of Systems   Review of Systems  All other systems reviewed and are negative.   Physical Exam Updated Vital Signs There were no vitals taken for this visit. Physical Exam Vitals and nursing note reviewed.  Constitutional:      General: He is not in acute distress.    Appearance: Normal appearance. He is well-developed. He is not toxic-appearing.  HENT:     Head: Normocephalic and atraumatic.  Eyes:     General: Lids are normal.     Conjunctiva/sclera: Conjunctivae normal.     Pupils: Pupils are equal, round, and reactive to light.  Neck:     Thyroid: No thyroid mass.  Trachea: No tracheal deviation.  Cardiovascular:     Rate and Rhythm: Normal rate and regular rhythm.     Heart sounds: Normal heart sounds. No murmur heard.    No gallop.  Pulmonary:     Effort: Pulmonary effort is normal. No respiratory distress.     Breath sounds: Normal breath sounds. No stridor. No decreased breath sounds, wheezing, rhonchi or rales.  Abdominal:     General: There is no distension.     Palpations: Abdomen is soft.     Tenderness: There is no abdominal tenderness. There is no rebound.  Musculoskeletal:        General: No tenderness. Normal range of motion.     Cervical back: Normal range of motion and neck  supple.  Skin:    General: Skin is warm and dry.     Findings: No abrasion or rash.  Neurological:     Mental Status: He is alert and oriented to person, place, and time. Mental status is at baseline.     GCS: GCS eye subscore is 4. GCS verbal subscore is 5. GCS motor subscore is 6.     Cranial Nerves: Cranial nerves are intact. No cranial nerve deficit.     Sensory: No sensory deficit.     Motor: Motor function is intact.  Psychiatric:        Attention and Perception: Attention normal.        Speech: Speech normal.        Behavior: Behavior normal.        Thought Content: Thought content does not include homicidal or suicidal ideation.     ED Results / Procedures / Treatments   Labs (all labs ordered are listed, but only abnormal results are displayed) Labs Reviewed - No data to display  EKG None  Radiology No results found.  Procedures Procedures    Medications Ordered in ED Medications - No data to display  ED Course/ Medical Decision Making/ A&P                                 Medical Decision Making  Patient monitored here for several hours and initially was sleepy but eventually came around.  Required no further intervention.  Was given referral for outpatient rehab  CRITICAL CARE Performed by: Toy Baker Total critical care time: 40 minutes Critical care time was exclusive of separately billable procedures and treating other patients. Critical care was necessary to treat or prevent imminent or life-threatening deterioration. Critical care was time spent personally by me on the following activities: development of treatment plan with patient and/or surrogate as well as nursing, discussions with consultants, evaluation of patient's response to treatment, examination of patient, obtaining history from patient or surrogate, ordering and performing treatments and interventions, ordering and review of laboratory studies, ordering and review of radiographic  studies, pulse oximetry and re-evaluation of patient's condition.         Final Clinical Impression(s) / ED Diagnoses Final diagnoses:  None    Rx / DC Orders ED Discharge Orders     None         Lorre Nick, MD 10/22/23 1425

## 2023-10-22 NOTE — ED Triage Notes (Signed)
Pt arrived by ambulance reporting  he was in his car smoking marijuana and he passed out. States this has happened before and he feels like something was laced. Admits to use of Heroin, snorting.   EMS reports bystanders called because he was passed out in his car. Upon arrival, patient noted to have pin point pupils and was given 1mg  of intranasal Narcan. Patient woke up after. On arrival, patient calm, cooperative. Denies SI/HI ideations or intent

## 2023-10-22 NOTE — ED Notes (Signed)
Found pt sitting in a chair alseep and made him get onto the bed.  I advised pt he could not sit in the chair due to fall risk.  Pt is lying mostly on the bed with a sandwich and juice on bedside table next to him.

## 2024-08-02 ENCOUNTER — Other Ambulatory Visit: Payer: Self-pay

## 2024-08-03 ENCOUNTER — Other Ambulatory Visit: Payer: Self-pay

## 2024-08-03 DIAGNOSIS — L03115 Cellulitis of right lower limb: Secondary | ICD-10-CM

## 2024-08-31 ENCOUNTER — Ambulatory Visit (HOSPITAL_COMMUNITY)
Admission: RE | Admit: 2024-08-31 | Discharge: 2024-08-31 | Disposition: A | Discharge status: Home / Self Care | Source: Ambulatory Visit | Attending: Vascular Surgery | Admitting: Vascular Surgery

## 2024-08-31 ENCOUNTER — Ambulatory Visit (INDEPENDENT_AMBULATORY_CARE_PROVIDER_SITE_OTHER): Admitting: Physician Assistant

## 2024-08-31 VITALS — BP 176/117 | Temp 98.0°F | Resp 18 | Ht 71.0 in | Wt >= 6400 oz

## 2024-08-31 DIAGNOSIS — L03115 Cellulitis of right lower limb: Secondary | ICD-10-CM | POA: Diagnosis not present

## 2024-08-31 DIAGNOSIS — I872 Venous insufficiency (chronic) (peripheral): Secondary | ICD-10-CM | POA: Diagnosis not present

## 2024-08-31 NOTE — Progress Notes (Signed)
 [facility]           OF [place]  History and Physical   [name] is a 48 y.o. male who presents with BLE swelling/lymphedema.  Pt states he has hx of significant back surgery and has a hard time elevating his legs.  He states that over the past year, he has gained about 75 lbs.  He states that his legs started swelling a couple of years ago.  He states his father had significant swelling in his legs and developed a blood clot.  He does not wear compression socks.  He has had venous ulcerations in the past.  He has never had a DVT.  He states he doesn't really leave the house and has been sedentary.  He states since his weight gain, his blood pressure has also been elevated.  He takes his medicine daily.    The pt is not on a statin for cholesterol management.  The pt is not on a daily aspirin .   Other AC:  none The pt is on ARB for hypertension.   The pt is not on medication for diabetes.   Tobacco hx:  current  Pt does not have family hx of AAA.  Past Medical History:  Diagnosis Date   ADHD (attention deficit hyperactivity disorder)    Anxiety    Arthritis    knee and back   Chronic back pain    Depression    GERD (gastroesophageal reflux disease)    Hypertension    Neuropathy    Obesity    PONV (postoperative nausea and vomiting)     Past Surgical History:  Procedure Laterality Date   BACK SURGERY     CARPAL TUNNEL RELEASE Left    HAND SURGERY     KNEE SURGERY Right    TONSILLECTOMY      Social History   Socioeconomic History   Marital status: Single    Spouse name: Not on file   Number of children: Not on file   Years of education: Not on file   Highest education level: Not on file  Occupational History   Not on file  Tobacco Use   Smoking status: Every Day    Current packs/day: 0.50    Average packs/day: 0.5 packs/day for 18.0 years (9.0 ttl pk-yrs)    Types: Cigarettes   Smokeless tobacco: Never  Vaping Use    Vaping status: Never Used  Substance and Sexual Activity   Alcohol use: No    Comment: quit 2009   Drug use: No   Sexual activity: Not on file  Other Topics Concern   Not on file  Social History Narrative   Not on file   Social Drivers of Health   Financial Resource Strain: Low Risk  (03/24/2023)   [facility]   Overall Financial Resource Strain (CARDIA)    Difficulty of Paying Living Expenses: Not hard at all  Food Insecurity: No Food Insecurity (03/24/2023)   Received from [facility]   Hunger Vital Sign    Within the past 12 months, you worried that your food would run out before you got the money to buy more.: Never true    Within the past 12 months, the food you bought just didn't last and you didn't have money to get more.: Never true  Transportation Needs: No Transportation Needs (03/24/2023)   Received [facility]   PRAPARE -  Administrator, Civil Service (Medical): No    Lack of Transportation (Non-Medical): No  Physical Activity: Not on file  Stress: Not on file  Social Connections: Unknown (04/08/2022)   Received from [facility]   Social Network    Social Network: Not on file  Intimate Partner Violence: Unknown (02/27/2022)   Received from [facility]   HITS    Physically Hurt: Not on file    Insult or Talk Down To: Not on file    Threaten Physical Harm: Not on file    Scream or Curse: Not on file     Family History  Problem Relation Age of Onset   Heart failure Mother    Heart disease Mother    Diabetes Father    Stroke Brother     Current Outpatient Medications  Medication Sig Dispense Refill   acetaminophen  (TYLENOL ) 325 MG tablet Take 2 tablets (650 mg total) by mouth every 6 (six) hours as needed for moderate pain. (Patient not taking: Reported on 06/03/2022)     clindamycin  (CLEOCIN ) 150 MG capsule Take 2 capsules (300 mg total) by mouth 4 (four) times daily. 80 capsule 0   gabapentin  (NEURONTIN ) 300 MG  capsule Take 300 mg by mouth 3 (three) times daily.     gabapentin  (NEURONTIN ) 800 MG tablet Take 1 tablet (800 mg total) by mouth 2 (two) times daily. (Patient not taking: Reported on 06/03/2022) 60 tablet 3   ibuprofen  (ADVIL ) 400 MG tablet Take 1 tablet (400 mg total) by mouth every 6 (six) hours as needed (leg pain). (Patient taking differently: Take 400-600 mg by mouth every 6 (six) hours as needed for mild pain or headache (leg pain).) 30 tablet 0   LINZESS  145 MCG CAPS capsule Take 145 mcg by mouth every other day.     losartan  (COZAAR ) 25 MG tablet Take 1 tablet (25 mg total) by mouth daily. 30 tablet 0   pantoprazole  (PROTONIX ) 20 MG tablet Take 20 mg by mouth daily before breakfast.     promethazine  (PHENERGAN ) 25 MG tablet Take 25 mg by mouth every 8 (eight) hours as needed for nausea/vomiting. (Patient not taking: Reported on 06/03/2022)     SSD 1 % cream Apply 1 Application topically See admin instructions. Apply a thin layer to affected skin 1-2 times daily as needed for cellulitis     traMADol  (ULTRAM ) 50 MG tablet Take 50 mg by mouth every 8 (eight) hours as needed (for pain).     No current facility-administered medications for this visit.    Allergies  Allergen Reactions   Boudreauxs Baby Butt Smooth [Aquaphilic] Other (See Comments)    Burned the skin   Tramadol  Nausea And Vomiting and Other (See Comments)    Can take in 2023; it just does not help the pain   Vicodin [Hydrocodone -Acetaminophen ] Nausea And Vomiting and Other (See Comments)    Burns the stomach    REVIEW OF SYSTEMS:   [X]  denotes positive finding, [ ]  denotes negative finding Cardiac  Comments:  Chest pain or chest pressure:    Shortness of breath upon exertion:    Short of breath when lying flat:    Irregular heart rhythm:        Vascular    Pain in calf, thigh, or hip brought on by ambulation:    Pain in feet at night that wakes you up from your sleep:     Blood clot in your veins:    Leg  swelling:  x       Pulmonary    Oxygen at home:    Productive cough:     Wheezing:         Neurologic    Sudden weakness in arms or legs:     Sudden numbness in arms or legs:     Sudden onset of difficulty speaking or slurred speech:    Temporary loss of vision in one eye:     Problems with dizziness:         Gastrointestinal    Blood in stool:     Vomited blood:         Genitourinary    Burning when urinating:     Blood in urine:        Psychiatric    Major depression:         Hematologic    Bleeding problems:    Problems with blood clotting too easily:        Skin    Rashes or ulcers:        Constitutional    Fever or chills:      PHYSICAL EXAMINATION:  Today's Vitals   08/31/24 1041 08/31/24 1042  BP: (!) 176/117   Resp: 18   Temp: 98 F (36.7 C)   TempSrc: Temporal   Weight: (!) 420 lb (190.5 kg)   Height: 5' 11 (1.803 m)   PainSc: 8  8   PainLoc: Leg    Body mass index is 58.58 kg/m.   General:  WDWN in NAD; vital signs documented above Gait: Not observed HENT: WNL, normocephalic Pulmonary: normal non-labored breathing without wheezing Cardiac: regular HR; without carotid bruits Abdomen: soft, obese, NT, aortic pulse is not palpable Skin: without rashes Vascular Exam/Pulses:  Right Left  Radial 2+ (normal) 2+ (normal)  DP 2+ (normal) 2+ (normal)   Extremities: BLE swelling below the knee bilaterally that minimally involves the foot with right worse than left.  No current ulcerations.   Neurologic: A&O X 3;  moving all extremities equally Psychiatric:  The pt has Normal affect.   Non-Invasive Vascular Imaging:   Venous duplex on 08/31/2024: Venous Reflux Times  +-------------------+---------+------+----------+------------+-------------  ----+  RIGHT             Reflux NoReflux  Reflux  Diameter cmsComments                                         Yes     Time                                    +-------------------+---------+------+----------+------------+-------------  ----+  CFV                         yes  >1 second                                 +-------------------+---------+------+----------+------------+-------------  ----+  FV mid                       yes  >1 second                                 +-------------------+---------+------+----------+------------+-------------  ----+  FV dist                                                 not  adequately                                                             visualized          +-------------------+---------+------+----------+------------+-------------  ----+  Popliteal         no                                                       +-------------------+---------+------+----------+------------+-------------  ----+  GSV at [facility]                   yes   >500 ms      1.03                        +-------------------+---------+------+----------+------------+-------------  ----+  GSV prox thigh               yes   >500 ms      0.77                        +-------------------+---------+------+----------+------------+-------------  ----+  GSV mid thigh                yes   >500 ms      0.87                        +-------------------+---------+------+----------+------------+-------------  ----+  GSV dist thigh               yes   >500 ms      0.86                        +-------------------+---------+------+----------+------------+-------------  ----+  GSV at knee                  yes   >500 ms      0.72                        +-------------------+---------+------+----------+------------+-------------  ----+  GSV prox calf                yes   >500 ms      0.64                        +-------------------+---------+------+----------+------------+-------------  ----+  GSV mid calf                 yes   >500 ms      0.70    out of   fascia      +-------------------+---------+------+----------+------------+-------------  ----+  SSV at [facility]  yes   >500 ms      0.67                        +-------------------+---------+------+----------+------------+-------------  ----+  SSV prox calf                yes   >500 ms      0.63                        +-------------------+---------+------+----------+------------+-------------  ----+  SSV mid calf                                            NWV                 +-------------------+---------+------+----------+------------+-------------  ----+  Varicosity arising                              1.39                        from the proximal                                                           GSV                                                                         +-------------------+---------+------+----------+------------+-------------   Summary:  Right:  - No evidence of deep vein thrombosis seen in the right lower extremity, from the common femoral through the popliteal veins.  - No evidence of superficial venous thrombosis in the right lower extremity.  - The deep venous system is incompetent in the common femoral and femoral veins.  - The great saphenous vein is grossly incompetent.  - The small saphenous vein is grossly incompetent.     [name] is a 48 y.o. male who presents with: BLE swelling with hx of ulcerations.     -pt has easily palpable DP pedal pulses bilaterally -in the right lower extremity, the pt does not have evidence of DVT.  Pt does have venous reflux in the deep and superficial venous systems.  The vein measures 0.86cm - 0.63cm.  unfortunately, he is not a candidate for laser ablation due to his obesity.   Discussed that he would not be considered for laser ablation until his weight is down to 300-320 lbs.  Discussed referral for bariatric surgery and he expressed interest,  so we will refer for evaluation.   -we do not have compression that would fit his legs so discussed wrapping his legs with moderate compression with ace wrap from toes to knee daily.  -discussed the importance of leg elevation and how to elevate properly - pt is advised to elevate their legs and a diagram is given to them to demonstrate for pt  to lay flat on their back with knees elevated and slightly bent with their feet higher than their knees, which puts their feet higher than their heart for 15 minutes per day.  If pt cannot lay flat, advised to lay as flat as possible as he may have trouble with this given his back issues.    -pt is advised to continue as much walking as possible and avoid sitting or standing for long periods of time.  -discussed importance of weight loss and exercise and that water aerobics would also be beneficial if he has access to a swimming pool.  -handout with recommendations given -pt will f/u as needed.  He knows we can re-evaluate if he can get his weight down to 300-320lbs.  -he is hypertensive today-discussed following up with his PCP to evaluate his pressure and medications.  He states they were supposed to get him a BP cuff, but this has not happened.  Discussed that weight loss would also help with this as well.     Lucie Apt, [facility] [phone]  Clinic MD:  Gretta
